# Patient Record
Sex: Male | Born: 1950
Health system: Southern US, Community
[De-identification: ages and names within clinical notes are randomized; demographics above are authoritative.]

## PROBLEM LIST (undated history)

## (undated) DIAGNOSIS — I10 Essential (primary) hypertension: Secondary | ICD-10-CM

## (undated) DIAGNOSIS — I429 Cardiomyopathy, unspecified: Secondary | ICD-10-CM

## (undated) DIAGNOSIS — I509 Heart failure, unspecified: Secondary | ICD-10-CM

## (undated) HISTORY — DX: Heart failure, unspecified: I50.9

## (undated) HISTORY — PX: KNEE SURGERY: SHX244

## (undated) HISTORY — PX: ARM AMPUTATION AT ELBOW: SHX550

---

## 2008-02-17 ENCOUNTER — Ambulatory Visit: Payer: Self-pay | Admitting: Emergency Medicine

## 2013-06-17 ENCOUNTER — Ambulatory Visit: Payer: Self-pay

## 2013-06-17 DIAGNOSIS — H60399 Other infective otitis externa, unspecified ear: Secondary | ICD-10-CM | POA: Diagnosis not present

## 2013-06-17 DIAGNOSIS — L600 Ingrowing nail: Secondary | ICD-10-CM | POA: Diagnosis not present

## 2013-06-17 DIAGNOSIS — H9209 Otalgia, unspecified ear: Secondary | ICD-10-CM | POA: Diagnosis not present

## 2017-06-01 ENCOUNTER — Ambulatory Visit
Admission: EM | Admit: 2017-06-01 | Discharge: 2017-06-01 | Disposition: A | Discharge status: Home / Self Care | Payer: Medicare Other | Attending: Family Medicine | Admitting: Family Medicine

## 2017-06-01 DIAGNOSIS — F17200 Nicotine dependence, unspecified, uncomplicated: Secondary | ICD-10-CM | POA: Insufficient documentation

## 2017-06-01 DIAGNOSIS — Z89201 Acquired absence of right upper limb, unspecified level: Secondary | ICD-10-CM | POA: Insufficient documentation

## 2017-06-01 DIAGNOSIS — J0100 Acute maxillary sinusitis, unspecified: Secondary | ICD-10-CM

## 2017-06-01 DIAGNOSIS — J029 Acute pharyngitis, unspecified: Secondary | ICD-10-CM

## 2017-06-01 DIAGNOSIS — R030 Elevated blood-pressure reading, without diagnosis of hypertension: Secondary | ICD-10-CM

## 2017-06-01 LAB — RAPID STREP SCREEN (MED CTR MEBANE ONLY): STREPTOCOCCUS, GROUP A SCREEN (DIRECT): NEGATIVE

## 2017-06-01 MED ORDER — AMOXICILLIN-POT CLAVULANATE 875-125 MG PO TABS: 1.0000 | ORAL_TABLET | Freq: Two times a day (BID) | ORAL | 0 refills | Status: DC

## 2017-06-01 NOTE — ED Provider Notes (Signed)
[name] ____________________________________________  Time seen: Approximately 11:12 AM  I have reviewed the triage vital signs and the nursing notes.   HISTORY  Chief Complaint Sore Throat   HPI Scott Meyer is a 66 y.o. male  presenting for evaluation of drainage in the back of his throat, some sore throat and itching sensation to his throat. Patient states that these symptoms have been present for approximately 1 week. Patient states that he is feeling a lot of drainage, and in further discussing with patient states drainage feels like it is in the back of his throat and ears going down his throat. Patient reports this sensation often makes him have to clear his throat with some cough. Patient also reports that he is having some pressure to the front of his face around his cheeks, that has been present since time of draiange. Denies any known fevers or chills. States has been taking some over-the-counter Tylenol and ibuprofen which does help with some of these complaints. Denies known sick contacts. Denies any chest pain, shortness of breath or chest discomfort. Denies any vision changes, dizziness, weakness or paresthesias. Has not been taking any other over-the-counter medications for the same complaints.  Patient reports that he has been told in the past that he had high blood pressure, but states that he does not follow with a doctor regularly. States he is usually seen at [facility] when seen by a Doctor and is unable to state if this is through outpatient office or through the emergency room. Patient reports that he lives with his sister.   Denies chest pain, shortness of breath, abdominal pain, dysuria, or rash. Denies recent sickness. Denies recent antibiotic use. Denies cardiac history. Denies known renal insufficiency.   past medical history High blood pressure readings-states never put on medication for this.  There are no active problems to display for this  patient.   Past Surgical History:  Procedure Laterality Date  . ARM AMPUTATION AT ELBOW Right   . KNEE SURGERY       No current facility-administered medications for this encounter.   Current Outpatient Prescriptions:  .  amoxicillin-clavulanate (AUGMENTIN) 875-125 MG tablet, Take 1 tablet by mouth every 12 (twelve) hours., Disp: 20 tablet, Rfl: 0  Allergies Patient has no known allergies.  History reviewed. No pertinent family history.  Social History Social History  Substance Use Topics  . Smoking status: Current Every Day Smoker  . Smokeless tobacco: Never Used  . Alcohol use No    Review of Systems Constitutional: No fever/chills Eyes: No visual changes. ENT: As above.  Cardiovascular: Denies chest pain. Respiratory: Denies shortness of breath. Gastrointestinal: No abdominal pain.  No nausea, no vomiting. Musculoskeletal: Negative for back pain. Skin: Negative for rash. Neurological: Negative for headaches, focal weakness or numbness.   ____________________________________________   PHYSICAL EXAM:  VITAL SIGNS: ED Triage Vitals  Enc Vitals Group     BP 06/01/17 1032 (!) 194/89     Pulse Rate 06/01/17 1032 61     Resp -- 18     Temp 06/01/17 1032 98.1 F (36.7 C)     Temp Source 06/01/17 1032 Oral     SpO2 06/01/17 1032 98 %     Weight 06/01/17 1031 135 lb (61.2 kg)     Height 06/01/17 1031 6' 3.5" (1.918 m)     Head Circumference --      Peak Flow --      Pain Score 06/01/17 1031 8  Pain Loc --      Pain Edu? --      Excl. in GC? --    Vitals:   06/01/17 1031 06/01/17 1032 06/01/17 1116  BP:  (!) 194/89 (!) 178/98  Pulse:  61   Temp:  98.1 F (36.7 C)   TempSrc:  Oral   SpO2:  98%   Weight: 135 lb (61.2 kg)    Height: 6' 3.5" (1.918 m)      Constitutional: Alert and oriented. Well appearing and in no acute distress. Eyes: Conjunctivae are normal.  Head: Atraumatic.Mild  tenderness to palpation bilateral maxillary sinuses, no frontal  sinus tenderness to palpation. No swelling. No erythema.   Ears: no erythema, normal TMs bilaterally.   Nose: nasal congestion with bilateral nasal turbinate erythema and edema.   Mouth/Throat: Mucous membranes are moist.  Oropharynx non-erythematous.No tonsillar swelling or exudate. Posterior pharyngeal drainage noted. Widespread dental decay. Neck: No stridor.  No cervical spine tenderness to palpation. Hematological/Lymphatic/Immunilogical: No cervical lymphadenopathy. Cardiovascular: Normal rate, regular rhythm. Grossly normal heart sounds.  Good peripheral circulation. Respiratory: Normal respiratory effort.  No retractions. No wheezes, rales or rhonchi. Good air movement.  Gastrointestinal: Soft and nontender.  Musculoskeletal: Steady gait. No cervical, thoracic or lumbar tenderness to palpation.  Neurologic:  Normal speech and language. No gross focal neurologic deficits are appreciated. No gait instability. Skin:  Skin is warm, dry and intact. No rash noted. Psychiatric: Mood and affect are normal. Speech and behavior are normal.  ___________________________________________   LABS (all labs ordered are listed, but only abnormal results are displayed)  Labs Reviewed  RAPID STREP SCREEN (NOT AT [facility])  CULTURE, GROUP A STREP [facility])   ____________________________________________  PROCEDURES Procedures  ________________________________________   INITIAL IMPRESSION / ASSESSMENT AND PLAN / ED COURSE  Pertinent labs & imaging results that were available during my care of the patient were reviewed by me and considered in my medical decision making (see chart for details).  Well appearing patient. No acute distress. Suspect maxillary sinusitis with associated postnasal drainage. Will set patient on oral Augmentin. Discussed in detail with patient need to have regular follow-up and close monitoring of his blood pressure which is noted be elevated at this time. Patient denies other  complaints at this time. Reports previous history of this. Encouraged patient to follow-up this week with primary care for further management of blood pressure. Encouraged to have monitoring at home and keep journal. Also discussed monitoring and when he is taking over-the-counter including NSAIDs and congestion medications that can increase blood pressure, and to avoid. States he will follow up. Discussed indication, risks and benefits of medications with patient.  Discussed follow up with Primary care physician this week. Discussed follow up and return parameters including no resolution or any worsening concerns. Patient verbalized understanding and agreed to plan.   ____________________________________________   FINAL CLINICAL IMPRESSION(S) / ED DIAGNOSES  Final diagnoses:  Acute maxillary sinusitis, recurrence not specified  Pharyngitis, unspecified etiology  Elevated blood pressure reading     New Prescriptions   AMOXICILLIN-CLAVULANATE (AUGMENTIN) 875-125 MG TABLET    Take 1 tablet by mouth every 12 (twelve) hours.    Note: This dictation was prepared with Dragon dictation along with smaller phrase technology. Any transcriptional errors that result from this process are unintentional.         Renford Dills, NP 06/01/17 1157

## 2017-06-01 NOTE — Discharge Instructions (Signed)
Take medication as prescribed. Rest. Drink plenty of fluids.   You need to have regular follow up. You need to have your blood pressure rechecked and managed. Please follow up with your primary care physician this week.    Return to Urgent care or Emergency room for new or worsening concerns.

## 2017-06-01 NOTE — ED Triage Notes (Signed)
Patient complains of sore throat. Patient states that he feels like "something is coming out of his brain and making him itch". Patient states that symptoms started on Wednesday.

## 2017-06-02 LAB — CULTURE, GROUP A STREP (THRC)

## 2018-04-08 ENCOUNTER — Ambulatory Visit
Admission: EM | Admit: 2018-04-08 | Discharge: 2018-04-08 | Disposition: A | Discharge status: Home / Self Care | Payer: Medicare Other | Attending: Family Medicine | Admitting: Family Medicine

## 2018-04-08 ENCOUNTER — Encounter: Payer: Self-pay | Admitting: Emergency Medicine

## 2018-04-08 ENCOUNTER — Other Ambulatory Visit: Payer: Self-pay

## 2018-04-08 DIAGNOSIS — R030 Elevated blood-pressure reading, without diagnosis of hypertension: Secondary | ICD-10-CM | POA: Diagnosis not present

## 2018-04-08 DIAGNOSIS — L03114 Cellulitis of left upper limb: Secondary | ICD-10-CM | POA: Diagnosis not present

## 2018-04-08 DIAGNOSIS — M25532 Pain in left wrist: Secondary | ICD-10-CM

## 2018-04-08 DIAGNOSIS — L03119 Cellulitis of unspecified part of limb: Secondary | ICD-10-CM

## 2018-04-08 MED ORDER — PREDNISONE 20 MG PO TABS: ORAL_TABLET | ORAL | 0 refills | Status: DC

## 2018-04-08 MED ORDER — DOXYCYCLINE HYCLATE 100 MG PO TABS: 100.0000 mg | ORAL_TABLET | Freq: Two times a day (BID) | ORAL | 0 refills | Status: DC

## 2018-04-08 NOTE — ED Provider Notes (Signed)
[name]    CSN: [id number] Arrival date & time: 04/08/18  1442     History   Chief Complaint Chief Complaint  Patient presents with  . Wrist Pain    left    HPI Scott Meyer is a 67 y.o. male.   67 yo male with a c/o left wrist pain, swelling and redness. Patient denies any fall or other traumatic injury. Denies any fevers, chills, known insect bites, rash, drainage, chest pains, shortness of breath, headaches.   The history is provided by the patient.  Wrist Pain     History reviewed. No pertinent past medical history.  There are no active problems to display for this patient.   Past Surgical History:  Procedure Laterality Date  . ARM AMPUTATION AT ELBOW Right   . KNEE SURGERY         Home Medications    Prior to Admission medications   Medication Sig Start Date End Date Taking? Authorizing Provider  amoxicillin-clavulanate (AUGMENTIN) 875-125 MG tablet Take 1 tablet by mouth every 12 (twelve) hours. 06/01/17   Renford Dills, NP  doxycycline (VIBRA-TABS) 100 MG tablet Take 1 tablet (100 mg total) by mouth 2 (two) times daily. 04/08/18   Payton Mccallum, MD  predniSONE (DELTASONE) 20 MG tablet 2 tabs po qd x 7 days 04/08/18   Payton Mccallum, MD    Family History Family History  Problem Relation Age of Onset  . Diabetes Mother   . Cirrhosis Father   . Alcohol abuse Father     Social History Social History   Tobacco Use  . Smoking status: Former Smoker    Last attempt to quit: 10/09/2017    Years since quitting: 0.4  . Smokeless tobacco: Never Used  Substance Use Topics  . Alcohol use: No  . Drug use: No     Allergies   Patient has no known allergies.   Review of Systems Review of Systems   Physical Exam Triage Vital Signs ED Triage Vitals  Enc Vitals Group     BP 04/08/18 1518 (!) 179/101     Pulse Rate 04/08/18 1518 76     Resp 04/08/18 1518 16     Temp 04/08/18 1518 98.1 F (36.7 C)     Temp Source 04/08/18 1518  Oral     SpO2 04/08/18 1518 99 %     Weight 04/08/18 1517 240 lb (108.9 kg)     Height 04/08/18 1517 6\' 3"  (1.905 m)     Head Circumference --      Peak Flow --      Pain Score 04/08/18 1517 8     Pain Loc --      Pain Edu? --      Excl. in GC? --    No data found.  Updated Vital Signs BP (!) 179/101 (BP Location: Left Arm)   Pulse 76   Temp 98.1 F (36.7 C) (Oral)   Resp 16   Ht 6\' 3"  (1.905 m)   Wt 240 lb (108.9 kg)   SpO2 99%   BMI 30.00 kg/m   Visual Acuity Right Eye Distance:   Left Eye Distance:   Bilateral Distance:    Right Eye Near:   Left Eye Near:    Bilateral Near:     Physical Exam  Constitutional: He appears well-developed and well-nourished. No distress.  Musculoskeletal:       Left wrist: He exhibits tenderness and swelling. He exhibits normal range of motion, no  bony tenderness, no effusion, no crepitus, no deformity and no laceration.  Left wrist with warmth, tenderness and blanchable erythema  Skin: He is not diaphoretic.  Nursing note and vitals reviewed.    UC Treatments / Results  Labs (all labs ordered are listed, but only abnormal results are displayed) Labs Reviewed - No data to display  EKG None  Radiology No results found.  Procedures Procedures (including critical care time)  Medications Ordered in UC Medications - No data to display  Initial Impression / Assessment and Plan / UC Course  I have reviewed the triage vital signs and the nursing notes.  Pertinent labs & imaging results that were available during my care of the patient were reviewed by me and considered in my medical decision making (see chart for details).      Final Clinical Impressions(s) / UC Diagnoses   Final diagnoses:  Cellulitis of wrist  Left wrist pain  Elevated blood pressure reading     Discharge Instructions     Tylenol as needed for pain, elevation, warm compresses    ED Prescriptions    Medication Sig Dispense Auth. Provider    predniSONE (DELTASONE) 20 MG tablet 2 tabs po qd x 7 days 14 tablet Sayre Witherington, MD   doxycycline (VIBRA-TABS) 100 MG tablet Take 1 tablet (100 mg total) by mouth 2 (two) times daily. 20 tablet Payton Mccallum, MD      1. Labs/x-ray results and diagnosis reviewed with patient/parent/guardian/family 2. rx as per orders above; reviewed possible side effects, interactions, risks and benefits  3. Recommend supportive treatment as above 4. Follow up with PCP this week for recheck blood pressure  5. Follow-up prn if symptoms worsen or don't improve  Controlled Substance Prescriptions [place] Controlled Substance Registry consulted? Not Applicable   Payton Mccallum, MD 04/08/18 [phone]

## 2018-04-08 NOTE — ED Triage Notes (Signed)
Patient in today c/o left wrist pain, which is chronic. Patient states his wrist started swelling on Monday (04/06/18).

## 2018-04-08 NOTE — Discharge Instructions (Addendum)
Tylenol as needed for pain, elevation, warm compresses

## 2018-04-26 ENCOUNTER — Ambulatory Visit
Admission: EM | Admit: 2018-04-26 | Discharge: 2018-04-26 | Disposition: A | Discharge status: Home / Self Care | Payer: Medicare Other | Attending: Family Medicine | Admitting: Family Medicine

## 2018-04-26 ENCOUNTER — Encounter: Payer: Self-pay | Admitting: Gynecology

## 2018-04-26 ENCOUNTER — Ambulatory Visit: Payer: Medicare Other

## 2018-04-26 DIAGNOSIS — R609 Edema, unspecified: Secondary | ICD-10-CM | POA: Insufficient documentation

## 2018-04-26 DIAGNOSIS — M79642 Pain in left hand: Secondary | ICD-10-CM | POA: Diagnosis not present

## 2018-04-26 DIAGNOSIS — Z811 Family history of alcohol abuse and dependence: Secondary | ICD-10-CM | POA: Diagnosis not present

## 2018-04-26 DIAGNOSIS — Z89201 Acquired absence of right upper limb, unspecified level: Secondary | ICD-10-CM | POA: Diagnosis not present

## 2018-04-26 DIAGNOSIS — M7989 Other specified soft tissue disorders: Secondary | ICD-10-CM | POA: Diagnosis not present

## 2018-04-26 DIAGNOSIS — Z8379 Family history of other diseases of the digestive system: Secondary | ICD-10-CM | POA: Diagnosis not present

## 2018-04-26 DIAGNOSIS — Z87891 Personal history of nicotine dependence: Secondary | ICD-10-CM | POA: Diagnosis not present

## 2018-04-26 DIAGNOSIS — M25532 Pain in left wrist: Secondary | ICD-10-CM | POA: Diagnosis not present

## 2018-04-26 DIAGNOSIS — I10 Essential (primary) hypertension: Secondary | ICD-10-CM | POA: Diagnosis not present

## 2018-04-26 DIAGNOSIS — X58XXXA Exposure to other specified factors, initial encounter: Secondary | ICD-10-CM | POA: Insufficient documentation

## 2018-04-26 DIAGNOSIS — S63502A Unspecified sprain of left wrist, initial encounter: Secondary | ICD-10-CM | POA: Diagnosis not present

## 2018-04-26 DIAGNOSIS — Z833 Family history of diabetes mellitus: Secondary | ICD-10-CM | POA: Diagnosis not present

## 2018-04-26 DIAGNOSIS — R600 Localized edema: Secondary | ICD-10-CM

## 2018-04-26 HISTORY — DX: Essential (primary) hypertension: I10

## 2018-04-26 LAB — CBC WITH DIFFERENTIAL/PLATELET
BASOS ABS: 0 10*3/uL (ref 0–0.1)
Basophils Relative: 1 %
EOS PCT: 2 %
Eosinophils Absolute: 0.2 10*3/uL (ref 0–0.7)
HCT: 42.4 % (ref 40.0–52.0)
Hemoglobin: 14 g/dL (ref 13.0–18.0)
LYMPHS PCT: 17 %
Lymphs Abs: 1.4 10*3/uL (ref 1.0–3.6)
MCH: 30.2 pg (ref 26.0–34.0)
MCHC: 33 g/dL (ref 32.0–36.0)
MCV: 91.6 fL (ref 80.0–100.0)
MONO ABS: 0.6 10*3/uL (ref 0.2–1.0)
Monocytes Relative: 7 %
Neutro Abs: 6.1 10*3/uL (ref 1.4–6.5)
Neutrophils Relative %: 73 %
PLATELETS: 183 10*3/uL (ref 150–440)
RBC: 4.63 MIL/uL (ref 4.40–5.90)
RDW: 13 % (ref 11.5–14.5)
WBC: 8.4 10*3/uL (ref 3.8–10.6)

## 2018-04-26 LAB — COMPREHENSIVE METABOLIC PANEL
ALBUMIN: 4.1 g/dL (ref 3.5–5.0)
ALK PHOS: 89 U/L (ref 38–126)
ALT: 25 U/L (ref 0–44)
AST: 27 U/L (ref 15–41)
Anion gap: 11 (ref 5–15)
BILIRUBIN TOTAL: 1.5 mg/dL — AB (ref 0.3–1.2)
BUN: 16 mg/dL (ref 8–23)
CO2: 21 mmol/L — ABNORMAL LOW (ref 22–32)
CREATININE: 0.95 mg/dL (ref 0.61–1.24)
Calcium: 9.2 mg/dL (ref 8.9–10.3)
Chloride: 107 mmol/L (ref 98–111)
GFR calc Af Amer: >60 mL/min (ref >60)
GLUCOSE: 111 mg/dL — AB (ref 70–99)
POTASSIUM: 3.8 mmol/L (ref 3.5–5.1)
Sodium: 139 mmol/L (ref 135–145)
TOTAL PROTEIN: 8.2 g/dL — AB (ref 6.5–8.1)

## 2018-04-26 LAB — URIC ACID: URIC ACID, SERUM: 6 mg/dL (ref 3.7–8.6)

## 2018-04-26 MED ORDER — LISINOPRIL 10 MG PO TABS: 10.0000 mg | ORAL_TABLET | Freq: Every day | ORAL | 0 refills | Status: DC

## 2018-04-26 MED ORDER — PREDNISONE 20 MG PO TABS: ORAL_TABLET | ORAL | 0 refills | Status: DC

## 2018-04-26 NOTE — ED Triage Notes (Signed)
Per patient was seen x 3 weeks ago for his left wrist pain. Per patient left wrist not getting any better. Per patient swelling in his left hand/wrist. Per stated have high blood pressure but does not have a primary care doctor.

## 2018-04-26 NOTE — ED Provider Notes (Signed)
[name]    CSN: [id number] Arrival date & time: 04/26/18  0802     History   Chief Complaint Chief Complaint  Patient presents with  . Wrist Pain    HPI Scott Meyer is a 67 y.o. male.   67 yo male seen 3 weeks ago with a left wrist/hand pain and treated for cellulitis. States symptoms improved, however after mowing the yard with a push mower last week symptoms worsened again with swelling and pain. Denies any fevers, chills, numbness/tingling, insect bites, falls.   Also has had high blood pressure 3 weeks ago and today as well. Denies chest pains or shortness of breath. States does not have a primary care provider.   The history is provided by the patient.  Wrist Pain     Past Medical History:  Diagnosis Date  . Hypertension     There are no active problems to display for this patient.   Past Surgical History:  Procedure Laterality Date  . ARM AMPUTATION AT ELBOW Right   . KNEE SURGERY         Home Medications    Prior to Admission medications   Medication Sig Start Date End Date Taking? Authorizing Provider  amoxicillin-clavulanate (AUGMENTIN) 875-125 MG tablet Take 1 tablet by mouth every 12 (twelve) hours. 06/01/17   Renford Dills, NP  doxycycline (VIBRA-TABS) 100 MG tablet Take 1 tablet (100 mg total) by mouth 2 (two) times daily. 04/08/18   Payton Mccallum, MD  lisinopril (PRINIVIL,ZESTRIL) 10 MG tablet Take 1 tablet (10 mg total) by mouth daily. 04/26/18   Payton Mccallum, MD  predniSONE (DELTASONE) 20 MG tablet 2 tabs po qd x 7 days 04/26/18   Payton Mccallum, MD    Family History Family History  Problem Relation Age of Onset  . Diabetes Mother   . Cirrhosis Father   . Alcohol abuse Father     Social History Social History   Tobacco Use  . Smoking status: Former Smoker    Last attempt to quit: 10/09/2017    Years since quitting: 0.5  . Smokeless tobacco: Never Used  Substance Use Topics  . Alcohol use: No  . Drug use: No      Allergies   Patient has no known allergies.   Review of Systems Review of Systems   Physical Exam Triage Vital Signs ED Triage Vitals  Enc Vitals Group     BP 04/26/18 0816 (!) 186/95     Pulse Rate 04/26/18 0816 63     Resp --      Temp 04/26/18 0816 97.7 F (36.5 C)     Temp Source 04/26/18 0816 Oral     SpO2 04/26/18 0816 100 %     Weight 04/26/18 0815 235 lb (106.6 kg)     Height --      Head Circumference --      Peak Flow --      Pain Score 04/26/18 0815 7     Pain Loc --      Pain Edu? --      Excl. in GC? --    No data found.  Updated Vital Signs BP (!) 186/95   Pulse 63   Temp 97.7 F (36.5 C) (Oral)   Wt 106.6 kg   SpO2 100%   BMI 29.37 kg/m   Visual Acuity Right Eye Distance:   Left Eye Distance:   Bilateral Distance:    Right Eye Near:   Left Eye Near:  Bilateral Near:     Physical Exam  Constitutional: He appears well-developed and well-nourished. No distress.  Cardiovascular: Normal rate.  Pulmonary/Chest: Effort normal. No respiratory distress.  Musculoskeletal:       Left wrist: He exhibits tenderness, bony tenderness and swelling. He exhibits normal range of motion, no effusion, no crepitus, no deformity and no laceration.       Left hand: He exhibits decreased range of motion, tenderness (mild), bony tenderness and swelling. He exhibits normal two-point discrimination, normal capillary refill, no deformity and no laceration. Normal sensation noted. Normal strength noted.  Skin: He is not diaphoretic.  Nursing note and vitals reviewed.    UC Treatments / Results  Labs (all labs ordered are listed, but only abnormal results are displayed) Labs Reviewed  COMPREHENSIVE METABOLIC PANEL - Abnormal; Notable for the following components:      Result Value   CO2 21 (*)    Glucose, Bld 111 (*)    Total Protein 8.2 (*)    Total Bilirubin 1.5 (*)    All other components within normal limits  CBC WITH DIFFERENTIAL/PLATELET  URIC  ACID    EKG None  Radiology Dg Wrist Complete Left  Result Date: 04/26/2018 CLINICAL DATA:  Left wrist and hand pain and swelling. No known injury. Recently diagnosed hand infection. EXAM: LEFT WRIST - COMPLETE 3+ VIEW COMPARISON:  Left hand obtained at the same time. FINDINGS: Radiocarpal spur formation. Cartilage calcification. Diffuse soft tissue swelling without soft tissue gas, bone destruction or periosteal reaction. IMPRESSION: 1. Diffuse soft tissue swelling without acute bony abnormality. 2. Degenerative changes and chondrocalcinosis. Electronically Signed   By: Beckie Salts M.D.   On: 04/26/2018 09:15   Dg Hand Complete Left  Result Date: 04/26/2018 CLINICAL DATA:  Left hand and wrist pain and swelling. No known injury. Recently diagnosed infection of the hand. EXAM: LEFT HAND - COMPLETE 3+ VIEW COMPARISON:  Left wrist radiographs obtained at the same time. FINDINGS: Diffuse soft tissue swelling. No soft tissue gas, bone destruction or periosteal reaction. No fracture or dislocation. IMPRESSION: Diffuse soft tissue swelling without underlying bony abnormality. Electronically Signed   By: Beckie Salts M.D.   On: 04/26/2018 09:16    Procedures Procedures (including critical care time)  Medications Ordered in UC Medications - No data to display  Initial Impression / Assessment and Plan / UC Course  I have reviewed the triage vital signs and the nursing notes.  Pertinent labs & imaging results that were available during my care of the patient were reviewed by me and considered in my medical decision making (see chart for details).      Final Clinical Impressions(s) / UC Diagnoses   Final diagnoses:  Edema of hand  Sprain of left wrist, initial encounter  Essential hypertension    ED Prescriptions    Medication Sig Dispense Auth. Provider   predniSONE (DELTASONE) 20 MG tablet 2 tabs po qd x 7 days 14 tablet Jacalynn Buzzell, MD   lisinopril (PRINIVIL,ZESTRIL) 10 MG tablet  Take 1 tablet (10 mg total) by mouth daily. 30 tablet Payton Mccallum, MD     1. Labs/x-ray results and diagnosis reviewed with patient 2. rx as per orders above; reviewed possible side effects, interactions, risks and benefits  3. Recommend supportive treatment with otc analgesics, dietary modifications  4. Establish care with PCP 5. Follow-up prn if symptoms worsen or don't improve   Controlled Substance Prescriptions [place] Controlled Substance Registry consulted? Not Applicable   Payton Mccallum, MD  04/26/18 1001  

## 2018-06-21 ENCOUNTER — Ambulatory Visit
Admission: EM | Admit: 2018-06-21 | Discharge: 2018-06-21 | Disposition: A | Discharge status: Home / Self Care | Payer: Medicare Other | Attending: Family Medicine | Admitting: Family Medicine

## 2018-06-21 ENCOUNTER — Other Ambulatory Visit: Payer: Self-pay

## 2018-06-21 DIAGNOSIS — M19032 Primary osteoarthritis, left wrist: Secondary | ICD-10-CM

## 2018-06-21 DIAGNOSIS — Z760 Encounter for issue of repeat prescription: Secondary | ICD-10-CM | POA: Diagnosis not present

## 2018-06-21 DIAGNOSIS — I10 Essential (primary) hypertension: Secondary | ICD-10-CM

## 2018-06-21 MED ORDER — MELOXICAM 15 MG PO TABS: 15.0000 mg | ORAL_TABLET | Freq: Every day | ORAL | 0 refills | Status: DC

## 2018-06-21 MED ORDER — LISINOPRIL 10 MG PO TABS: 10.0000 mg | ORAL_TABLET | Freq: Every day | ORAL | 0 refills | Status: DC

## 2018-06-21 NOTE — ED Triage Notes (Signed)
Pt with chronic left wrist pain. States it flares up from time to time and denies recent injury.

## 2018-06-21 NOTE — ED Provider Notes (Signed)
[name]    CSN: [id number] Arrival date & time: 06/21/18  1002     History   Chief Complaint Chief Complaint  Patient presents with  . Wrist Pain    HPI Scott Meyer is a 67 y.o. male.   67 yo male with a c/o recurrent left wrist pain and swelling due to his arthritis. Patient had x-ray 2 months ago showing osteoarthritis. Denies any recent injury. Also states he ran out of blood pressure medicine and still has not established care with a Primary Care Provider.   The history is provided by the patient.  Wrist Pain     Past Medical History:  Diagnosis Date  . Hypertension     There are no active problems to display for this patient.   Past Surgical History:  Procedure Laterality Date  . ARM AMPUTATION AT ELBOW Right   . KNEE SURGERY         Home Medications    Prior to Admission medications   Medication Sig Start Date End Date Taking? Authorizing Provider  amoxicillin-clavulanate (AUGMENTIN) 875-125 MG tablet Take 1 tablet by mouth every 12 (twelve) hours. 06/01/17   Renford Dills, NP  doxycycline (VIBRA-TABS) 100 MG tablet Take 1 tablet (100 mg total) by mouth 2 (two) times daily. 04/08/18   Payton Mccallum, MD  lisinopril (PRINIVIL,ZESTRIL) 10 MG tablet Take 1 tablet (10 mg total) by mouth daily. 06/21/18   Payton Mccallum, MD  meloxicam (MOBIC) 15 MG tablet Take 1 tablet (15 mg total) by mouth daily. 06/21/18   Payton Mccallum, MD  predniSONE (DELTASONE) 20 MG tablet 2 tabs po qd x 7 days 04/26/18   Payton Mccallum, MD    Family History Family History  Problem Relation Age of Onset  . Diabetes Mother   . Cirrhosis Father   . Alcohol abuse Father     Social History Social History   Tobacco Use  . Smoking status: Current Some Day Smoker    Last attempt to quit: 10/09/2017    Years since quitting: 0.6  . Smokeless tobacco: Never Used  Substance Use Topics  . Alcohol use: No    Comment: quit 8 months ago  . Drug use: No      Allergies   Patient has no known allergies.   Review of Systems Review of Systems   Physical Exam Triage Vital Signs ED Triage Vitals  Enc Vitals Group     BP --      Pulse Rate 06/21/18 1013 (!) 55     Resp 06/21/18 1013 18     Temp 06/21/18 1013 (!) 97 F (36.1 C)     Temp Source 06/21/18 1013 Oral     SpO2 06/21/18 1013 99 %     Weight 06/21/18 1010 235 lb 0.2 oz (106.6 kg)     Height 06/21/18 1010 6\' 3"  (1.905 m)     Head Circumference --      Peak Flow --      Pain Score 06/21/18 1010 9     Pain Loc --      Pain Edu? --      Excl. in GC? --    No data found.  Updated Vital Signs Pulse (!) 55   Temp (!) 97 F (36.1 C) (Oral)   Resp 18   Ht 6\' 3"  (1.905 m)   Wt 106.6 kg   SpO2 99%   BMI 29.37 kg/m   Visual Acuity Right Eye Distance:   Left  Eye Distance:   Bilateral Distance:    Right Eye Near:   Left Eye Near:    Bilateral Near:     Physical Exam  Constitutional: He appears well-developed and well-nourished. No distress.  Musculoskeletal:       Left wrist: He exhibits tenderness and swelling.  Skin: He is not diaphoretic.  Nursing note and vitals reviewed.    UC Treatments / Results  Labs (all labs ordered are listed, but only abnormal results are displayed) Labs Reviewed - No data to display  EKG None  Radiology No results found.  Procedures Procedures (including critical care time)  Medications Ordered in UC Medications - No data to display  Initial Impression / Assessment and Plan / UC Course  I have reviewed the triage vital signs and the nursing notes.  Pertinent labs & imaging results that were available during my care of the patient were reviewed by me and considered in my medical decision making (see chart for details).      Final Clinical Impressions(s) / UC Diagnoses   Final diagnoses:  Primary osteoarthritis of left wrist  Essential hypertension     Discharge Instructions     Establish care with  Primary Care provider    ED Prescriptions    Medication Sig Dispense Auth. Provider   lisinopril (PRINIVIL,ZESTRIL) 10 MG tablet Take 1 tablet (10 mg total) by mouth daily. 30 tablet Payton Mccallum, MD   meloxicam (MOBIC) 15 MG tablet Take 1 tablet (15 mg total) by mouth daily. 15 tablet Payton Mccallum, MD      1. diagnosis reviewed with patient 2. rx as per orders above; reviewed possible side effects, interactions, risks and benefits  3. Recommend supportive treatment with otc tylenol prn 4. Establish care with PCP for chronic health management 4. Follow-up prn if symptoms worsen or don't improve   Controlled Substance Prescriptions [place] Controlled Substance Registry consulted? Not Applicable   Payton Mccallum, MD 06/21/18 1128

## 2018-06-21 NOTE — Discharge Instructions (Signed)
Establish care with Primary Care provider

## 2019-04-08 ENCOUNTER — Other Ambulatory Visit: Payer: Self-pay

## 2019-04-08 ENCOUNTER — Ambulatory Visit
Admission: EM | Admit: 2019-04-08 | Discharge: 2019-04-08 | Disposition: A | Discharge status: Home / Self Care | Payer: Medicare Other | Attending: Urgent Care | Admitting: Urgent Care

## 2019-04-08 DIAGNOSIS — B353 Tinea pedis: Secondary | ICD-10-CM | POA: Diagnosis not present

## 2019-04-08 DIAGNOSIS — H60501 Unspecified acute noninfective otitis externa, right ear: Secondary | ICD-10-CM | POA: Diagnosis not present

## 2019-04-08 MED ORDER — CLOTRIMAZOLE 1 % EX CREA: TOPICAL_CREAM | CUTANEOUS | 0 refills | Status: DC

## 2019-04-08 MED ORDER — NEOMYCIN-POLYMYXIN-HC 3.5-10000-1 OT SUSP: 4.0000 [drp] | Freq: Three times a day (TID) | OTIC | 0 refills | Status: DC

## 2019-04-08 NOTE — ED Provider Notes (Signed)
Scott Meyer   Name: Scott Meyer DOB: [birth year] MRN: [id number] CSN: [id number] PCP: System, Provider Not In  Arrival date and time:  04/08/19 1158  Chief Complaint:  Otalgia and Pruritis   NOTE: Prior to seeing the patient today, I have reviewed the triage nursing documentation and vital signs. Clinical staff has updated patient's PMH/PSHx, current medication list, and drug allergies/intolerances to ensure comprehensive history available to assist in medical decision making.   History:   HPI: Scott Meyer is a 68 y.o. male who presents today with complaints of pain in his RIGHT ear for the last week or so. Patient believes that he has a "blackhead" in his ear. He denies otorrhea or fever. His hearing is reported to be normal. He denies any other recent upper respiratory symptoms. Patient reports that he has had this in the past and his issue was treated with antibiotics, which improved his symptoms. Additionally, patient with complaints of his feet itching x 1 year. Patient notes that they itch worse at night. Patient has used some over the counter medications which have "helped some". Patient soaks his feet every night in hot water, which also helps to improve his symptoms.   Past Medical History:  Diagnosis Date  . Hypertension     Past Surgical History:  Procedure Laterality Date  . ARM AMPUTATION AT ELBOW Right   . KNEE SURGERY      Family History  Problem Relation Age of Onset  . Diabetes Mother   . Cirrhosis Father   . Alcohol abuse Father     Social History   Tobacco Use  . Smoking status: Former Smoker    Quit date: 10/09/2017    Years since quitting: 1.4  . Smokeless tobacco: Never Used  Substance Use Topics  . Alcohol use: No    Comment: quit 8 months ago  . Drug use: No    There are no active problems to display for this patient.   Home Medications:    Current Meds  Medication Sig  . [DISCONTINUED] lisinopril (PRINIVIL,ZESTRIL) 10 MG tablet Take  1 tablet (10 mg total) by mouth daily.    Allergies:   Patient has no known allergies.  Review of Systems (ROS): Review of Systems  Constitutional: Negative for chills and fever.  HENT: Positive for ear pain. Negative for congestion, ear discharge, rhinorrhea, sinus pressure, sinus pain and sore throat.   Respiratory: Negative for cough and shortness of breath.   Cardiovascular: Negative for chest pain and palpitations.  Skin: Positive for color change and rash.  Neurological: Negative for dizziness, syncope, weakness and headaches.  Hematological: Negative for adenopathy.  All other systems reviewed and are negative.    Vital Signs: Today's Vitals   04/08/19 1205 04/08/19 1207  BP:  (!) 164/94  Pulse:  68  Resp:  16  Temp:  98.1 F (36.7 C)  TempSrc:  Oral  SpO2:  99%  Weight: 248 lb (112.5 kg)   Height: 6' 3.5" (1.918 m)   PainSc: 9      Physical Exam: Physical Exam  Constitutional: He is oriented to person, place, and time and well-developed, well-nourished, and in no distress.  HENT:  Head: Normocephalic and atraumatic.  Right Ear: Hearing normal. Tympanic membrane is not erythematous.  Left Ear: Hearing, tympanic membrane, external ear and ear canal normal.  Nose: Nose normal.  Mouth/Throat: Uvula is midline and mucous membranes are normal. No posterior oropharyngeal edema or posterior oropharyngeal erythema.  EAC erythematous and swollen;  tender. Pus filled comedone deep in canal; ruptured on exam.   Eyes: Pupils are equal, round, and reactive to light. EOM are normal.  Neck: Normal range of motion. Neck supple.  Cardiovascular: Normal rate, regular rhythm, normal heart sounds and intact distal pulses. Exam reveals no gallop and no friction rub.  No murmur heard. Pulmonary/Chest: Effort normal and breath sounds normal. No respiratory distress. He has no wheezes. He has no rales.  Lymphadenopathy:    He has no cervical adenopathy.  Neurological: He is alert and  oriented to person, place, and time. Gait normal. GCS score is 15.  Skin: Skin is warm and dry. Rash (fungal (BILATERAL feet); areas of peeling and raw skin noted about toes) noted.  Psychiatric: Mood, memory, affect and judgment normal.  Nursing note and vitals reviewed.   Urgent Care Treatments / Results:   LABS: PLEASE NOTE: all labs that were ordered this encounter are listed, however only abnormal results are displayed. Labs Reviewed - No data to display  EKG: -None  RADIOLOGY: No results found.  PROCEDURES: Procedures  MEDICATIONS RECEIVED THIS VISIT: Medications - No data to display  PERTINENT CLINICAL COURSE NOTES/UPDATES:   Initial Impression / Assessment and Plan / Urgent Care Course:  Pertinent labs & imaging results that were available during my care of the patient were personally reviewed by me and considered in my medical decision making (see lab/imaging section of note for values and interpretations).  Scott Meyer is a 68 y.o. male who presents to [facility] today with complaints of Otalgia and Pruritis   Patient is well appearing overall in clinic today. He does not appear to be in any acute distress. Presenting symptoms (see HPI) and exam as documented above. RIGHT ear with tenderness and erythema. Comedone present that was ruptured on exam. TM normal. Will treat with a  5 day course of Cortisporin. Patient advised to keep ears clean and dry. Encouraged to avoid cotton tipped swabs. May use Tylenol and/or Ibuprofen as needed for pain.   Regarding his feet. Appearance and reported symptoms consistent with tinea pedis. Will cover with topical clotrimazole. Patient advised to keep feet clean and dry to help prevent further exacerbations of his fungal condition. If not improving, patient may benefit from seeing podiatry. He will need to discuss this with his PCP.   Discussed follow up with primary care physician in 1 week for re-evaluation. I have reviewed  the follow up and strict return precautions for any new or worsening symptoms. Patient is aware of symptoms that would be deemed urgent/emergent, and would thus require further evaluation either here or in the emergency department. At the time of discharge, he verbalized understanding and consent with the discharge plan as it was reviewed with him. All questions were fielded by provider and/or clinic staff prior to patient discharge.    Final Clinical Impressions / Urgent Care Diagnoses:   Final diagnoses:  Tinea pedis of both feet  Acute otitis externa of right ear, unspecified type    New Prescriptions:  [place] Controlled Substance Registry consulted? Not Applicable  Meds ordered this encounter  Medications  . clotrimazole (LOTRIMIN) 1 % cream    Sig: Apply to affected area 2 times daily    Dispense:  15 g    Refill:  0  . neomycin-polymyxin-hydrocortisone (CORTISPORIN) 3.5-10000-1 OTIC suspension    Sig: Place 4 drops into both ears 3 (three) times daily. X 1 week    Dispense:  10 mL  Refill:  0    Recommended Follow up Care:  Patient encouraged to follow up with the following provider within the specified time frame, or sooner as dictated by the severity of his symptoms. As always, he was instructed that for any urgent/emergent care needs, he should seek care either here or in the emergency department for more immediate evaluation.  Follow-up Information    PCP In 1 week.         NOTE: This note was prepared using Scientist, clinical (histocompatibility and immunogenetics)Dragon dictation software along with smaller Lobbyistphrase technology. Despite my best ability to proofread, there is the potential that transcriptional errors may still occur from this process, and are completely unintentional.     Verlee MonteGray, Marica Trentham E, NP 04/08/19 1248

## 2019-04-08 NOTE — Discharge Instructions (Addendum)
It was very nice seeing you today in clinic. Thank you for entrusting me with your care.   Please utilize the medications that we discussed. Your prescriptions have been called in to your pharmacy. Make sure that you are keeping your feet clean and dry.   Make arrangements to follow up with your regular doctor in 1 week for re-evaluation if not improving. If your symptoms/condition worsens, please seek follow up care either here or in the ER. Please remember, our [place] providers are "right here with you" when you need [place].   Again, it was my pleasure to take care of you today. Thank you for choosing our clinic. I hope that you start to feel better quickly.   Honor Loh, MSN, APRN, FNP-C, CEN Advanced Practice Provider [facility]

## 2019-04-08 NOTE — ED Triage Notes (Signed)
Patient complains of right ear pain that started a while back and hes concerned that he has a blackhead in his ear.  Patient also complains of his feet itching at night for one year.

## 2019-04-10 ENCOUNTER — Ambulatory Visit
Admission: EM | Admit: 2019-04-10 | Discharge: 2019-04-10 | Disposition: A | Discharge status: Home / Self Care | Payer: Medicare Other | Attending: Family Medicine | Admitting: Family Medicine

## 2019-04-10 DIAGNOSIS — H9201 Otalgia, right ear: Secondary | ICD-10-CM

## 2019-04-10 DIAGNOSIS — H60501 Unspecified acute noninfective otitis externa, right ear: Secondary | ICD-10-CM

## 2019-04-10 MED ORDER — OFLOXACIN 0.3 % OT SOLN: 10.0000 [drp] | Freq: Every day | OTIC | 0 refills | Status: AC

## 2019-04-10 MED ORDER — AMOXICILLIN-POT CLAVULANATE 875-125 MG PO TABS: 1.0000 | ORAL_TABLET | Freq: Two times a day (BID) | ORAL | 0 refills | Status: DC

## 2019-04-10 NOTE — ED Provider Notes (Signed)
[name] ____________________________________________  Time seen: Approximately 12:30 PM  I have reviewed the triage vital signs and the nursing notes.   HISTORY  Chief Complaint Otalgia   HPI Scott Meyer is a 68 y.o. male presenting for evaluation of right ear discomfort.  Patient reports he was seen in urgent care for same complaint this past Thursday and was started on eardrops.  States he has been having more ear discomfort.  Also reports some itching to his ear.  Denies ear drainage or hearing changes.  Denies aggravating or alleviating factors.  No fevers.  Denies cough, congestion, sore throat, chest pain or shortness of breath.  Reports otherwise doing well.  Does not have a primary care physician.  States he is working to establish primary care.   Past Medical History:  Diagnosis Date   Hypertension     There are no active problems to display for this patient.   Past Surgical History:  Procedure Laterality Date   ARM AMPUTATION AT ELBOW Right    KNEE SURGERY       No current facility-administered medications for this encounter.   Current Outpatient Medications:    amoxicillin-clavulanate (AUGMENTIN) 875-125 MG tablet, Take 1 tablet by mouth every 12 (twelve) hours., Disp: 20 tablet, Rfl: 0   clotrimazole (LOTRIMIN) 1 % cream, Apply to affected area 2 times daily, Disp: 15 g, Rfl: 0   ofloxacin (FLOXIN) 0.3 % OTIC solution, Place 10 drops into the right ear daily for 7 days., Disp: 5 mL, Rfl: 0  Allergies Patient has no known allergies.  Family History  Problem Relation Age of Onset   Diabetes Mother    Cirrhosis Father    Alcohol abuse Father     Social History Social History   Tobacco Use   Smoking status: Former Smoker    Quit date: 10/09/2017    Years since quitting: 1.5   Smokeless tobacco: Never Used  Substance Use Topics   Alcohol use: No    Comment: quit 8 months ago   Drug use: No    Review of  Systems Constitutional: No fever ENT: No sore throat.  Positive right ear complaints. Cardiovascular: Denies chest pain. Respiratory: Denies shortness of breath. Gastrointestinal: No abdominal pain. Musculoskeletal: Negative for back pain. Skin: Negative for rash. Neurological: Negative for headaches.  ____________________________________________   PHYSICAL EXAM:  VITAL SIGNS: ED Triage Vitals  Enc Vitals Group     BP 04/10/19 1219 (!) 180/94     Pulse Rate 04/10/19 1219 68     Resp 04/10/19 1219 18     Temp 04/10/19 1219 98.1 F (36.7 C)     Temp Source 04/10/19 1219 Oral     SpO2 04/10/19 1219 100 %     Weight 04/10/19 1222 248 lb 0.3 oz (112.5 kg)     Height --      Head Circumference --      Peak Flow --      Pain Score 04/10/19 1222 8     Pain Loc --      Pain Edu? --      Excl. in GC? --    Vitals:   04/10/19 1219 04/10/19 1222 04/10/19 1237  BP: (!) 180/94  (!) 168/99  Pulse: 68    Resp: 18    Temp: 98.1 F (36.7 C)    TempSrc: Oral    SpO2: 100%    Weight:  248 lb 0.3 oz (112.5 kg)      Constitutional: Alert  and oriented. Well appearing and in no acute distress. Eyes: Conjunctivae are normal.  ENT      Head: Normocephalic and atraumatic.       Ears: No mass or tenderness bilaterally.  No surrounding erythema or edema bilaterally.  Left: Nontender, normal canal, no erythema  Right: Minimal tenderness to auricle movement.  Right ear canal erythematous with mild edema and yellowish discharge.  Mild TM erythema.  TM appears intact.      Nose: No congestion Hematological/Lymphatic/Immunilogical: No cervical lymphadenopathy. Cardiovascular: Normal rate, regular rhythm. Grossly normal heart sounds.  Good peripheral circulation. Respiratory: Normal respiratory effort without tachypnea nor retractions. Breath sounds are clear and equal bilaterally. No wheezes, rales, rhonchi. Musculoskeletal: Steady gait Neurologic:  Normal speech and language. Speech is  normal. No gait instability.  Skin:  Skin is warm, dry and intact. No rash noted. Psychiatric: Mood and affect are normal. Speech and behavior are normal. Patient exhibits appropriate insight and judgment   ___________________________________________   LABS (all labs ordered are listed, but only abnormal results are displayed)  Labs Reviewed - No data to display   PROCEDURES Procedures    INITIAL IMPRESSION / ASSESSMENT AND PLAN / ED COURSE  Pertinent labs & imaging results that were available during my care of the patient were reviewed by me and considered in my medical decision making (see chart for details).  Well-appearing patient.  No acute distress.  Right otitis externa with canal erythema and edema.  Patient was on Cortisporin will change to ofloxacin, and counseled patient how to instill drops.  Will also start on oral Augmentin.  Without primary care and elevated blood pressure reading today.  Recheck improved.  Counseled patient to monitor, keep journal and establish primary care, patient states he is currently in the process of establishing. Discussed indication, risks and benefits of medications with patient.   Discussed follow up and return parameters including no resolution or any worsening concerns. Patient verbalized understanding and agreed to plan.   ____________________________________________   FINAL CLINICAL IMPRESSION(S) / ED DIAGNOSES  Final diagnoses:  Otalgia, right  Acute otitis externa of right ear, unspecified type     ED Discharge Orders         Ordered    ofloxacin (FLOXIN) 0.3 % OTIC solution  Daily     04/10/19 1232    amoxicillin-clavulanate (AUGMENTIN) 875-125 MG tablet  Every 12 hours     04/10/19 1232           Note: This dictation was prepared with Dragon dictation along with smaller phrase technology. Any transcriptional errors that result from this process are unintentional.        Marylene Land, NP 04/10/19 1310

## 2019-04-10 NOTE — ED Triage Notes (Signed)
Pt here to follow up on his right ear pain he was seen 2 days ago for and states he was given a rx for ear drops but states the ear has gotten worse and unable to get the drops in due to the swelling.

## 2019-04-10 NOTE — Discharge Instructions (Addendum)
Stop current ear drops.Take new medication as prescribed. Rest. Drink plenty of fluids.   Follow up with your primary care physician this week as needed. Return to Urgent care for new or worsening concerns.

## 2019-04-27 ENCOUNTER — Ambulatory Visit: Payer: Medicare Other | Admitting: Family Medicine

## 2019-05-03 ENCOUNTER — Ambulatory Visit (INDEPENDENT_AMBULATORY_CARE_PROVIDER_SITE_OTHER): Payer: Medicare Other | Admitting: Family Medicine

## 2019-05-03 ENCOUNTER — Encounter: Payer: Self-pay | Admitting: Family Medicine

## 2019-05-03 ENCOUNTER — Other Ambulatory Visit: Payer: Self-pay

## 2019-05-03 VITALS — BP 160/100 | HR 76 | Ht 75.0 in | Wt 244.0 lb

## 2019-05-03 DIAGNOSIS — I10 Essential (primary) hypertension: Secondary | ICD-10-CM

## 2019-05-03 DIAGNOSIS — Z7689 Persons encountering health services in other specified circumstances: Secondary | ICD-10-CM

## 2019-05-03 MED ORDER — LISINOPRIL-HYDROCHLOROTHIAZIDE 10-12.5 MG PO TABS: 1.0000 | ORAL_TABLET | Freq: Every day | ORAL | 1 refills | Status: DC

## 2019-05-03 NOTE — Patient Instructions (Signed)

## 2019-05-03 NOTE — Progress Notes (Signed)
Date:  05/03/2019   Name:  Scott Meyer   DOB:  11-25-1950   MRN:  [id number]   Chief Complaint: Establish Care and Hypertension (been off of lisinopril 10mg  since oct of last year)  Hypertension This is a chronic problem. The current episode started more than 1 year ago. The problem has been gradually worsening since onset. The problem is uncontrolled (nonconpliant). Associated symptoms include shortness of breath. Pertinent negatives include no anxiety, blurred vision, chest pain, headaches, malaise/fatigue, neck pain, orthopnea, palpitations, peripheral edema, PND or sweats. There are no associated agents to hypertension. Past treatments include nothing. Compliance problems include diet and medication cost (just the concept of taking medications).  There is no history of angina, kidney disease, CAD/MI, CVA, heart failure, left ventricular hypertrophy, PVD or retinopathy. There is no history of chronic renal disease, a hypertension causing med or renovascular disease.    Review of Systems  Constitutional: Negative for chills, fever and malaise/fatigue.  HENT: Negative for drooling, ear discharge, ear pain, nosebleeds, postnasal drip, rhinorrhea and sore throat.   Eyes: Negative for blurred vision.  Respiratory: Positive for shortness of breath. Negative for cough, chest tightness, wheezing and stridor.   Cardiovascular: Negative for chest pain, palpitations, orthopnea, leg swelling and PND.  Gastrointestinal: Negative for abdominal pain, blood in stool, constipation, diarrhea and nausea.  Endocrine: Negative for polydipsia.  Genitourinary: Negative for dysuria, frequency, hematuria and urgency.  Musculoskeletal: Negative for back pain, myalgias and neck pain.  Skin: Negative for rash.  Allergic/Immunologic: Negative for environmental allergies.  Neurological: Negative for dizziness and headaches.  Hematological: Does not bruise/bleed easily.  Psychiatric/Behavioral: Negative for  suicidal ideas. The patient is not nervous/anxious.     There are no active problems to display for this patient.   No Known Allergies  Past Surgical History:  Procedure Laterality Date  . ARM AMPUTATION AT ELBOW Right   . KNEE SURGERY      Social History   Tobacco Use  . Smoking status: Former Smoker    Quit date: 10/09/2017    Years since quitting: 1.5  . Smokeless tobacco: Never Used  Substance Use Topics  . Alcohol use: No    Comment: quit 8 months ago  . Drug use: No     Medication list has been reviewed and updated.  No outpatient medications have been marked as taking for the 05/03/19 encounter (Office Visit) with Juline Patch, MD.    [facility] 2/9 Scores 05/03/2019  PHQ - 2 Score 0  PHQ- 9 Score 0    BP Readings from Last 3 Encounters:  05/03/19 (!) 160/100  04/10/19 (!) 168/99  04/08/19 (!) 164/94    Physical Exam Vitals signs and nursing note reviewed.  Constitutional:      Appearance: He is normal weight.  HENT:     Head: Normocephalic.     Right Ear: Tympanic membrane, ear canal and external ear normal. There is no impacted cerumen.     Left Ear: Tympanic membrane, ear canal and external ear normal. There is no impacted cerumen.     Nose: Nose normal. No congestion or rhinorrhea.     Mouth/Throat:     Mouth: Mucous membranes are moist.  Eyes:     General: No scleral icterus.       Right eye: No discharge.        Left eye: No discharge.     Conjunctiva/sclera: Conjunctivae normal.     Pupils: Pupils are equal, round, and reactive  to light.  Neck:     Musculoskeletal: Normal range of motion and neck supple.     Thyroid: No thyromegaly.     Vascular: No JVD.     Trachea: No tracheal deviation.  Cardiovascular:     Rate and Rhythm: Normal rate and regular rhythm.     Pulses: Normal pulses.     Heart sounds: Normal heart sounds. No murmur. No friction rub. No gallop.   Pulmonary:     Effort: No respiratory distress.     Breath sounds: Normal  breath sounds. No wheezing or rales.  Abdominal:     General: Bowel sounds are normal.     Palpations: Abdomen is soft. There is no mass.     Tenderness: There is no abdominal tenderness. There is no guarding or rebound.  Musculoskeletal: Normal range of motion.        General: No tenderness.  Lymphadenopathy:     Cervical: No cervical adenopathy.  Skin:    General: Skin is warm.     Findings: No rash.  Neurological:     Mental Status: He is alert and oriented to person, place, and time.     Cranial Nerves: No cranial nerve deficit.     Deep Tendon Reflexes: Reflexes are normal and symmetric.     Wt Readings from Last 3 Encounters:  05/03/19 244 lb (110.7 kg)  04/10/19 248 lb 0.3 oz (112.5 kg)  04/08/19 248 lb (112.5 kg)    BP (!) 160/100   Pulse 76   Ht 6\' 3"  (1.905 m)   Wt 244 lb (110.7 kg)   BMI 30.50 kg/m   Assessment and Plan: 1. Establishing care with new doctor, encounter for Patient establishes care with new physician.  Patient's chart was reviewed in previous encounters physicals and labs were evaluated.  2. Essential hypertension Patient has a history of essential hypertension on lisinopril although lisinopril first started but patient would not return for further evaluation or measurement of blood pressure.  So he is appearing today with no medications and apparently no understanding of high blood pressure - lisinopril-hydrochlorothiazide (ZESTORETIC) 10-12.5 MG tablet; Take 1 tablet by mouth daily.  Dispense: 90 tablet; Refill: 1

## 2019-06-14 ENCOUNTER — Encounter: Payer: Medicare Other | Admitting: Family Medicine

## 2019-06-16 ENCOUNTER — Other Ambulatory Visit: Payer: Self-pay

## 2019-06-16 ENCOUNTER — Encounter: Payer: Self-pay | Admitting: Family Medicine

## 2019-06-16 ENCOUNTER — Ambulatory Visit (INDEPENDENT_AMBULATORY_CARE_PROVIDER_SITE_OTHER): Payer: Medicare Other | Admitting: Family Medicine

## 2019-06-16 VITALS — BP 138/74 | HR 80 | Ht 75.0 in | Wt 242.0 lb

## 2019-06-16 DIAGNOSIS — I10 Essential (primary) hypertension: Secondary | ICD-10-CM | POA: Diagnosis not present

## 2019-06-16 DIAGNOSIS — Z0000 Encounter for general adult medical examination without abnormal findings: Secondary | ICD-10-CM

## 2019-06-16 DIAGNOSIS — T464X5A Adverse effect of angiotensin-converting-enzyme inhibitors, initial encounter: Secondary | ICD-10-CM

## 2019-06-16 DIAGNOSIS — Z23 Encounter for immunization: Secondary | ICD-10-CM

## 2019-06-16 DIAGNOSIS — S48911S Complete traumatic amputation of right shoulder and upper arm, level unspecified, sequela: Secondary | ICD-10-CM

## 2019-06-16 DIAGNOSIS — R05 Cough: Secondary | ICD-10-CM | POA: Diagnosis not present

## 2019-06-16 MED ORDER — LOSARTAN POTASSIUM-HCTZ 50-12.5 MG PO TABS: 1.0000 | ORAL_TABLET | Freq: Every day | ORAL | 3 refills | Status: DC

## 2019-06-16 MED ORDER — LOSARTAN POTASSIUM-HCTZ 50-12.5 MG PO TABS: 1.0000 | ORAL_TABLET | Freq: Every day | ORAL | 1 refills | Status: DC
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
## 2019-06-16 NOTE — Patient Instructions (Signed)

## 2019-06-17 LAB — RENAL FUNCTION PANEL
Albumin: 4.6 g/dL (ref 3.8–4.8)
BUN/Creatinine Ratio: 19 (ref 10–24)
BUN: 23 mg/dL (ref 8–27)
CO2: 22 mmol/L (ref 20–29)
Calcium: 10 mg/dL (ref 8.6–10.2)
Chloride: 103 mmol/L (ref 96–106)
Creatinine, Ser: 1.23 mg/dL (ref 0.76–1.27)
GFR calc Af Amer: 69 mL/min/{1.73_m2} (ref >59)
GFR calc non Af Amer: 60 mL/min/{1.73_m2} (ref >59)
Glucose: 111 mg/dL — ABNORMAL HIGH (ref 65–99)
Phosphorus: 2.2 mg/dL — ABNORMAL LOW (ref 2.8–4.1)
Potassium: 4.2 mmol/L (ref 3.5–5.2)
Sodium: 139 mmol/L (ref 134–144)

## 2019-06-17 LAB — LIPID PANEL WITH LDL/HDL RATIO
Cholesterol, Total: 205 mg/dL — ABNORMAL HIGH (ref 100–199)
HDL: 36 mg/dL — ABNORMAL LOW (ref >39)
LDL Chol Calc (NIH): 148 mg/dL — ABNORMAL HIGH (ref 0–99)
LDL/HDL Ratio: 4.1 ratio — ABNORMAL HIGH (ref 0.0–3.6)
Triglycerides: 116 mg/dL (ref 0–149)
VLDL Cholesterol Cal: 21 mg/dL (ref 5–40)

## 2019-07-07 ENCOUNTER — Encounter: Payer: Self-pay | Admitting: Family Medicine

## 2019-07-07 ENCOUNTER — Other Ambulatory Visit: Payer: Self-pay

## 2019-07-07 ENCOUNTER — Ambulatory Visit (INDEPENDENT_AMBULATORY_CARE_PROVIDER_SITE_OTHER): Payer: Medicare Other | Admitting: Family Medicine

## 2019-07-07 DIAGNOSIS — T464X5A Adverse effect of angiotensin-converting-enzyme inhibitors, initial encounter: Secondary | ICD-10-CM

## 2019-07-07 DIAGNOSIS — R05 Cough: Secondary | ICD-10-CM | POA: Diagnosis not present

## 2019-07-07 DIAGNOSIS — I10 Essential (primary) hypertension: Secondary | ICD-10-CM | POA: Diagnosis not present

## 2019-07-07 DIAGNOSIS — R058 Other specified cough: Secondary | ICD-10-CM

## 2019-07-07 NOTE — Progress Notes (Signed)
Date:  07/07/2019   Name:  Scott Meyer   DOB:  1951-05-19   MRN:  [id number]   Chief Complaint: Hypertension  Hypertension This is a chronic problem. The current episode started more than 1 year ago. The problem has been gradually improving since onset. The problem is controlled. Pertinent negatives include no anxiety, blurred vision, chest pain, headaches, malaise/fatigue, neck pain, orthopnea, palpitations, peripheral edema, PND, shortness of breath or sweats. There are no associated agents to hypertension. Past treatments include angiotensin blockers. The current treatment provides moderate improvement. There is no history of angina, kidney disease, CAD/MI, CVA, heart failure, left ventricular hypertrophy, PVD or retinopathy. There is no history of chronic renal disease, a hypertension causing med or renovascular disease.    Review of Systems  Constitutional: Negative for chills, fever and malaise/fatigue.  HENT: Negative for drooling, ear discharge, ear pain and sore throat.   Eyes: Negative for blurred vision.  Respiratory: Negative for cough, shortness of breath and wheezing.   Cardiovascular: Negative for chest pain, palpitations, orthopnea, leg swelling and PND.  Gastrointestinal: Negative for abdominal pain, blood in stool, constipation, diarrhea and nausea.  Endocrine: Negative for polydipsia.  Genitourinary: Negative for dysuria, frequency, hematuria and urgency.  Musculoskeletal: Negative for back pain, myalgias and neck pain.  Skin: Negative for rash.  Allergic/Immunologic: Negative for environmental allergies.  Neurological: Negative for dizziness and headaches.  Hematological: Does not bruise/bleed easily.  Psychiatric/Behavioral: Negative for suicidal ideas. The patient is not nervous/anxious.     There are no active problems to display for this patient.   No Known Allergies  Past Surgical History:  Procedure Laterality Date  . ARM AMPUTATION AT ELBOW Right    . KNEE SURGERY      Social History   Tobacco Use  . Smoking status: Former Smoker    Quit date: 10/09/2017    Years since quitting: 1.7  . Smokeless tobacco: Never Used  Substance Use Topics  . Alcohol use: No    Comment: quit 8 months ago  . Drug use: No     Medication list has been reviewed and updated.  Current Meds  Medication Sig  . losartan-hydrochlorothiazide (HYZAAR) 50-12.5 MG tablet Take 1 tablet by mouth daily.    PHQ 2/9 Scores 06/16/2019 05/03/2019  PHQ - 2 Score 0 0  PHQ- 9 Score 0 0    BP Readings from Last 3 Encounters:  07/07/19 110/76  06/16/19 138/74  05/03/19 (!) 160/100    Physical Exam Vitals signs and nursing note reviewed.  HENT:     Head: Normocephalic.     Right Ear: External ear normal. There is impacted cerumen.     Left Ear: External ear normal. There is impacted cerumen.     Nose: Nose normal.  Eyes:     General: No scleral icterus.       Right eye: No discharge.        Left eye: No discharge.     Conjunctiva/sclera: Conjunctivae normal.     Pupils: Pupils are equal, round, and reactive to light.  Neck:     Musculoskeletal: Normal range of motion and neck supple.     Thyroid: No thyromegaly.     Vascular: No JVD.     Trachea: No tracheal deviation.  Cardiovascular:     Rate and Rhythm: Normal rate and regular rhythm.     Heart sounds: Normal heart sounds, S1 normal and S2 normal. Heart sounds not distant. No murmur. No systolic murmur.  No diastolic murmur. No friction rub. No gallop. No S3 or S4 sounds.   Pulmonary:     Effort: No respiratory distress.     Breath sounds: Normal breath sounds. No wheezing or rales.  Abdominal:     General: Bowel sounds are normal.     Palpations: Abdomen is soft. There is no mass.     Tenderness: There is no abdominal tenderness. There is no guarding or rebound.  Musculoskeletal: Normal range of motion.        General: No tenderness.  Lymphadenopathy:     Cervical: No cervical adenopathy.   Skin:    General: Skin is warm.     Findings: No rash.  Neurological:     Mental Status: He is alert and oriented to person, place, and time.     Cranial Nerves: No cranial nerve deficit.     Deep Tendon Reflexes: Reflexes are normal and symmetric.     Wt Readings from Last 3 Encounters:  07/07/19 242 lb (109.8 kg)  06/16/19 242 lb (109.8 kg)  05/03/19 244 lb (110.7 kg)    BP 110/76   Pulse 65   Resp 16   Ht 6\' 3"  (1.905 m)   Wt 242 lb (109.8 kg)   SpO2 99%   BMI 30.25 kg/m   Assessment and Plan:  1. Essential hypertension Chronic.  Controlled.  Stable.  Continue losartan hydrochlorothiazide 50-.  Will recheck in 6 months.  At which time we will do lab work previous lab work was reviewed -12.5 mg  2. Cough due to ACE inhibitor Patient had cough due to the ACE inhibitor we switched over to the receptor blocker and patient has had minimal cough on this regimen.

## 2020-02-21 ENCOUNTER — Telehealth: Payer: Self-pay | Admitting: Family Medicine

## 2020-02-21 NOTE — Telephone Encounter (Signed)
Call could not be completed. Pt due to schedule Medicare Annual Wellness Visit (AWV) either virtually/audio only or in office. Whichever the patients preference is.  No history of AWV; please schedule at anytime with [facility] Health Advisor.  This should be a 40 minute visit

## 2020-04-02 DIAGNOSIS — Z23 Encounter for immunization: Secondary | ICD-10-CM | POA: Diagnosis not present

## 2020-11-27 ENCOUNTER — Emergency Department: Payer: Medicare Other

## 2020-11-27 ENCOUNTER — Inpatient Hospital Stay
Admission: EM | Admit: 2020-11-27 | Discharge: 2020-11-30 | DRG: 291 | Disposition: A | Discharge status: Home Health | Payer: Medicare Other | Attending: Internal Medicine | Admitting: Internal Medicine

## 2020-11-27 ENCOUNTER — Ambulatory Visit
Admission: EM | Admit: 2020-11-27 | Discharge: 2020-11-27 | Disposition: A | Discharge status: Home / Self Care | Payer: Medicare Other | Attending: Physician Assistant | Admitting: Physician Assistant

## 2020-11-27 ENCOUNTER — Other Ambulatory Visit: Payer: Self-pay

## 2020-11-27 DIAGNOSIS — I248 Other forms of acute ischemic heart disease: Secondary | ICD-10-CM | POA: Diagnosis present

## 2020-11-27 DIAGNOSIS — R609 Edema, unspecified: Secondary | ICD-10-CM

## 2020-11-27 DIAGNOSIS — Z87891 Personal history of nicotine dependence: Secondary | ICD-10-CM

## 2020-11-27 DIAGNOSIS — I5031 Acute diastolic (congestive) heart failure: Secondary | ICD-10-CM

## 2020-11-27 DIAGNOSIS — R0789 Other chest pain: Secondary | ICD-10-CM | POA: Diagnosis not present

## 2020-11-27 DIAGNOSIS — R001 Bradycardia, unspecified: Secondary | ICD-10-CM | POA: Diagnosis not present

## 2020-11-27 DIAGNOSIS — I471 Supraventricular tachycardia: Secondary | ICD-10-CM

## 2020-11-27 DIAGNOSIS — R600 Localized edema: Secondary | ICD-10-CM

## 2020-11-27 DIAGNOSIS — I160 Hypertensive urgency: Secondary | ICD-10-CM | POA: Diagnosis present

## 2020-11-27 DIAGNOSIS — I10 Essential (primary) hypertension: Secondary | ICD-10-CM | POA: Diagnosis not present

## 2020-11-27 DIAGNOSIS — I110 Hypertensive heart disease with heart failure: Principal | ICD-10-CM | POA: Diagnosis present

## 2020-11-27 DIAGNOSIS — I493 Ventricular premature depolarization: Secondary | ICD-10-CM

## 2020-11-27 DIAGNOSIS — I4719 Other supraventricular tachycardia: Secondary | ICD-10-CM

## 2020-11-27 DIAGNOSIS — Z79899 Other long term (current) drug therapy: Secondary | ICD-10-CM

## 2020-11-27 DIAGNOSIS — I472 Ventricular tachycardia: Secondary | ICD-10-CM | POA: Diagnosis present

## 2020-11-27 DIAGNOSIS — S48911A Complete traumatic amputation of right shoulder and upper arm, level unspecified, initial encounter: Secondary | ICD-10-CM

## 2020-11-27 DIAGNOSIS — I5021 Acute systolic (congestive) heart failure: Secondary | ICD-10-CM | POA: Diagnosis present

## 2020-11-27 DIAGNOSIS — I429 Cardiomyopathy, unspecified: Secondary | ICD-10-CM | POA: Diagnosis present

## 2020-11-27 DIAGNOSIS — R7989 Other specified abnormal findings of blood chemistry: Secondary | ICD-10-CM

## 2020-11-27 DIAGNOSIS — I4729 Other ventricular tachycardia: Secondary | ICD-10-CM

## 2020-11-27 DIAGNOSIS — E785 Hyperlipidemia, unspecified: Secondary | ICD-10-CM

## 2020-11-27 DIAGNOSIS — I5022 Chronic systolic (congestive) heart failure: Secondary | ICD-10-CM

## 2020-11-27 DIAGNOSIS — R778 Other specified abnormalities of plasma proteins: Secondary | ICD-10-CM

## 2020-11-27 DIAGNOSIS — R0602 Shortness of breath: Secondary | ICD-10-CM

## 2020-11-27 DIAGNOSIS — N179 Acute kidney failure, unspecified: Secondary | ICD-10-CM | POA: Diagnosis not present

## 2020-11-27 DIAGNOSIS — L280 Lichen simplex chronicus: Secondary | ICD-10-CM | POA: Diagnosis present

## 2020-11-27 DIAGNOSIS — I420 Dilated cardiomyopathy: Secondary | ICD-10-CM

## 2020-11-27 DIAGNOSIS — Z89211 Acquired absence of right upper limb below elbow: Secondary | ICD-10-CM

## 2020-11-27 DIAGNOSIS — R079 Chest pain, unspecified: Secondary | ICD-10-CM | POA: Diagnosis not present

## 2020-11-27 DIAGNOSIS — I5042 Chronic combined systolic (congestive) and diastolic (congestive) heart failure: Secondary | ICD-10-CM

## 2020-11-27 DIAGNOSIS — Z20822 Contact with and (suspected) exposure to covid-19: Secondary | ICD-10-CM | POA: Diagnosis present

## 2020-11-27 DIAGNOSIS — I5041 Acute combined systolic (congestive) and diastolic (congestive) heart failure: Secondary | ICD-10-CM | POA: Diagnosis present

## 2020-11-27 HISTORY — DX: Cardiomyopathy, unspecified: I42.9

## 2020-11-27 LAB — CBC WITH DIFFERENTIAL/PLATELET
Abs Immature Granulocytes: 0.02 10*3/uL (ref 0.00–0.07)
Basophils Absolute: 0.1 10*3/uL (ref 0.0–0.1)
Basophils Relative: 1 %
Eosinophils Absolute: 0.2 10*3/uL (ref 0.0–0.5)
Eosinophils Relative: 3 %
HCT: 45.5 % (ref 39.0–52.0)
Hemoglobin: 15 g/dL (ref 13.0–17.0)
Immature Granulocytes: 0 %
Lymphocytes Relative: 21 %
Lymphs Abs: 1.6 10*3/uL (ref 0.7–4.0)
MCH: 30.4 pg (ref 26.0–34.0)
MCHC: 33 g/dL (ref 30.0–36.0)
MCV: 92.3 fL (ref 80.0–100.0)
Monocytes Absolute: 0.6 10*3/uL (ref 0.1–1.0)
Monocytes Relative: 8 %
Neutro Abs: 5.1 10*3/uL (ref 1.7–7.7)
Neutrophils Relative %: 67 %
Platelets: 160 10*3/uL (ref 150–400)
RBC: 4.93 MIL/uL (ref 4.22–5.81)
RDW: 12 % (ref 11.5–15.5)
WBC: 7.6 10*3/uL (ref 4.0–10.5)
nRBC: 0 % (ref 0.0–0.2)

## 2020-11-27 LAB — TROPONIN I (HIGH SENSITIVITY)
Troponin I (High Sensitivity): 36 ng/L — ABNORMAL HIGH (ref <18)
Troponin I (High Sensitivity): 46 ng/L — ABNORMAL HIGH (ref <18)

## 2020-11-27 LAB — COMPREHENSIVE METABOLIC PANEL
ALT: 24 U/L (ref 0–44)
AST: 30 U/L (ref 15–41)
Albumin: 3.8 g/dL (ref 3.5–5.0)
Alkaline Phosphatase: 81 U/L (ref 38–126)
Anion gap: 7 (ref 5–15)
BUN: 19 mg/dL (ref 8–23)
CO2: 23 mmol/L (ref 22–32)
Calcium: 9.6 mg/dL (ref 8.9–10.3)
Chloride: 107 mmol/L (ref 98–111)
Creatinine, Ser: 1.06 mg/dL (ref 0.61–1.24)
GFR, Estimated: >60 mL/min (ref >60)
Glucose, Bld: 93 mg/dL (ref 70–99)
Potassium: 4.4 mmol/L (ref 3.5–5.1)
Sodium: 137 mmol/L (ref 135–145)
Total Bilirubin: 1.2 mg/dL (ref 0.3–1.2)
Total Protein: 7.3 g/dL (ref 6.5–8.1)

## 2020-11-27 LAB — BRAIN NATRIURETIC PEPTIDE: B Natriuretic Peptide: 141.1 pg/mL — ABNORMAL HIGH (ref 0.0–100.0)

## 2020-11-27 LAB — MAGNESIUM: Magnesium: 1.6 mg/dL — ABNORMAL LOW (ref 1.7–2.4)

## 2020-11-27 LAB — TSH: TSH: 1.745 u[IU]/mL (ref 0.350–4.500)

## 2020-11-27 MED ORDER — ACETAMINOPHEN 325 MG PO TABS: 650.0000 mg | ORAL_TABLET | Freq: Four times a day (QID) | ORAL | Status: DC | PRN

## 2020-11-27 MED ORDER — NITROGLYCERIN 0.4 MG SL SUBL
0.4000 mg | SUBLINGUAL_TABLET | SUBLINGUAL | Status: DC | PRN
Start: 2020-11-27 — End: 2020-11-30

## 2020-11-27 MED ORDER — MAGNESIUM HYDROXIDE 400 MG/5ML PO SUSP: 30.0000 mL | Freq: Every day | ORAL | Status: DC | PRN

## 2020-11-27 MED ORDER — ASPIRIN EC 81 MG PO TBEC
81.0000 mg | DELAYED_RELEASE_TABLET | Freq: Every day | ORAL | Status: DC
  Administered 2020-11-28 – 2020-11-30 (×3): 81 mg via ORAL
  Filled 2020-11-27 (×3): qty 1

## 2020-11-27 MED ORDER — FUROSEMIDE 10 MG/ML IJ SOLN
40.0000 mg | Freq: Once | INTRAMUSCULAR | Status: AC
  Administered 2020-11-27: 40 mg via INTRAVENOUS
  Filled 2020-11-27: qty 4

## 2020-11-27 MED ORDER — HYDROCHLOROTHIAZIDE 12.5 MG PO CAPS
12.5000 mg | ORAL_CAPSULE | Freq: Every day | ORAL | Status: DC
  Administered 2020-11-27 – 2020-11-28 (×2): 12.5 mg via ORAL
  Filled 2020-11-27 (×2): qty 1

## 2020-11-27 MED ORDER — MORPHINE SULFATE (PF) 2 MG/ML IV SOLN
2.0000 mg | INTRAVENOUS | Status: DC | PRN
  Administered 2020-11-28 – 2020-11-29 (×3): 2 mg via INTRAVENOUS
  Filled 2020-11-27 (×3): qty 1

## 2020-11-27 MED ORDER — LOSARTAN POTASSIUM-HCTZ 50-12.5 MG PO TABS: 1.0000 | ORAL_TABLET | Freq: Every day | ORAL | Status: DC

## 2020-11-27 MED ORDER — SODIUM CHLORIDE 0.9 % IV SOLN: INTRAVENOUS | Status: DC

## 2020-11-27 MED ORDER — ONDANSETRON HCL 4 MG PO TABS: 4.0000 mg | ORAL_TABLET | Freq: Four times a day (QID) | ORAL | Status: DC | PRN

## 2020-11-27 MED ORDER — LOSARTAN POTASSIUM 50 MG PO TABS
50.0000 mg | ORAL_TABLET | Freq: Every day | ORAL | Status: DC
  Administered 2020-11-27 – 2020-11-28 (×2): 50 mg via ORAL
  Filled 2020-11-27 (×2): qty 1

## 2020-11-27 MED ORDER — ENOXAPARIN SODIUM 60 MG/0.6ML ~~LOC~~ SOLN
0.5000 mg/kg | SUBCUTANEOUS | Status: DC
  Administered 2020-11-27: 55 mg via SUBCUTANEOUS
  Filled 2020-11-27 (×2): qty 0.6

## 2020-11-27 MED ORDER — ACETAMINOPHEN 650 MG RE SUPP: 650.0000 mg | Freq: Four times a day (QID) | RECTAL | Status: DC | PRN

## 2020-11-27 MED ORDER — ASPIRIN 81 MG PO CHEW
324.0000 mg | CHEWABLE_TABLET | Freq: Once | ORAL | Status: DC
  Filled 2020-11-27: qty 4

## 2020-11-27 MED ORDER — ASPIRIN 81 MG PO CHEW
324.0000 mg | CHEWABLE_TABLET | Freq: Once | ORAL | Status: AC
  Administered 2020-11-27: 324 mg via ORAL

## 2020-11-27 MED ORDER — TRAZODONE HCL 50 MG PO TABS: 25.0000 mg | ORAL_TABLET | Freq: Every evening | ORAL | Status: DC | PRN

## 2020-11-27 MED ORDER — FUROSEMIDE 10 MG/ML IJ SOLN
40.0000 mg | Freq: Two times a day (BID) | INTRAMUSCULAR | Status: DC
  Administered 2020-11-28: 40 mg via INTRAVENOUS
  Filled 2020-11-27: qty 4

## 2020-11-27 MED ORDER — ONDANSETRON HCL 4 MG/2ML IJ SOLN
4.0000 mg | Freq: Four times a day (QID) | INTRAMUSCULAR | Status: DC | PRN
  Administered 2020-11-28: 4 mg via INTRAVENOUS
  Filled 2020-11-27: qty 2
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
## 2020-11-27 NOTE — ED Notes (Signed)
Pt given warm blanket, denies any needs at this time. Pt has urinal at bedside.

## 2020-11-27 NOTE — H&P (Addendum)
[place]   PATIENT NAME: Scott Meyer    MR#:  [id number]  DATE OF BIRTH:  1951/01/21  DATE OF ADMISSION:  11/27/2020  PRIMARY CARE PHYSICIAN: Duanne Limerick, MD   Patient is coming from: Home  REQUESTING/REFERRING PHYSICIAN: Willy Eddy, MD CHIEF COMPLAINT:   Chief Complaint  Patient presents with  . Irregular Heart Beat  . Leg Swelling  . Hypertension    HISTORY OF PRESENT ILLNESS:  Scott Meyer is a 70 y.o. African-American male with medical history significant for essential hypertension, who presented to the emergency room with acute onset of right lower extremity swelling for which she was seen in urgent care and was noted to be hypertensive with frequent PVCs on her EKG.  The patient has been having chest pain felt as pressure and associated dyspnea intermittently over the last several weeks.  He admitted to dyspnea on exertion.  He has bilateral lower extremity edema more on the right leg and ankle.  He admitted to occasional dry cough without wheezing.  No associated nausea or vomiting or diaphoresis.  No cough or wheezing or hemoptysis.  No recent travels or surgeries.  No headache or dizziness or blurred vision.  No fever or chills.  He has chronic right lower extremity edema since he had a fracture in 1981 with ankle hyperpigmentation.    ED Course: Upon presentation to the ER, blood pressure was 198/78 with a heart rate of 37 and later 86 and otherwise normal vital signs.  Labs revealed unremarkable CMP.  BNP was 141.1 and high-sensitivity troponin I was 36.  CBC was within normal.  COVID-19 PCR is currently pending. EKG as reviewed by me :Showed sinus rhythm with rate of 71 and PVCs with T wave inversion laterally, LVH with QRS widening and repolarization abnormality.  Imaging: Portable chest x-ray showed mild pulmonary vascular congestion.  Right lower extremity venous Doppler came back negative for DVT.  The patient was given 4 with aspirin and 40 mg of  IV Lasix.  She will be admitted to an observation progressive unit bed for further evaluation and management.   PAST MEDICAL HISTORY:   Past Medical History:  Diagnosis Date  . Hypertension     PAST SURGICAL HISTORY:   Past Surgical History:  Procedure Laterality Date  . ARM AMPUTATION AT ELBOW Right   . KNEE SURGERY      SOCIAL HISTORY:   Social History   Tobacco Use  . Smoking status: Former Smoker    Quit date: 10/09/2017    Years since quitting: 3.1  . Smokeless tobacco: Never Used  Substance Use Topics  . Alcohol use: No    Comment: quit 8 months ago    FAMILY HISTORY:   Family History  Problem Relation Age of Onset  . Diabetes Mother   . Cirrhosis Father   . Alcohol abuse Father     DRUG ALLERGIES:  No Known Allergies  REVIEW OF SYSTEMS:   ROS As per history of present illness. All pertinent systems were reviewed above. Constitutional, HEENT, cardiovascular, respiratory, GI, GU, musculoskeletal, neuro, psychiatric, endocrine, integumentary and hematologic systems were reviewed and are otherwise negative/unremarkable except for positive findings mentioned above in the HPI.   MEDICATIONS AT HOME:   Prior to Admission medications   Medication Sig Start Date End Date Taking? Authorizing Provider  losartan-hydrochlorothiazide (HYZAAR) 50-12.5 MG tablet Take 1 tablet by mouth daily. 06/16/19   Duanne Limerick, MD  lisinopril (PRINIVIL,ZESTRIL) 10 MG tablet  Take 1 tablet (10 mg total) by mouth daily. 06/21/18 04/08/19  Payton Mccallum, MD      VITAL SIGNS:  Blood pressure (!) 158/81, pulse 68, temperature 98.1 F (36.7 C), temperature source Oral, resp. rate 17, height 6\' 3"  (1.905 m), weight 108.9 kg, SpO2 98 %.  PHYSICAL EXAMINATION:  Physical Exam  GENERAL:  70 y.o.-year-old African-American male patient lying in the bed with no acute distress.  EYES: Pupils equal, round, reactive to light and accommodation. No scleral icterus. Extraocular muscles intact.   HEENT: Head atraumatic, normocephalic. Oropharynx and nasopharynx clear.  NECK:  Supple, no jugular venous distention. No thyroid enlargement, no tenderness.  LUNGS: Slight diminished bibasal breath sounds with minimal bibasal rales.  No use of accessory muscles of respiration.  CARDIOVASCULAR: Regular rate and rhythm, S1, S2 normal. No murmurs, rubs, or gallops.  ABDOMEN: Soft, nondistended, nontender. Bowel sounds present. No organomegaly or mass.  EXTREMITIES: Bilateral lower extremity pitting edema, with no cyanosis, or clubbing.  He is status post right arm amputation.  His right leg is more swollen with 2+ to 3+ pitting edema extending to the foot with hyperpigmentation and lichenification around the ankle without tenderness. NEUROLOGIC: Cranial nerves II through XII are intact. Muscle strength 5/5 in all extremities. Sensation intact. Gait not checked.  PSYCHIATRIC: The patient is alert and oriented x 3.  Normal affect and good eye contact. SKIN: No obvious rash, lesion, or ulcer.   LABORATORY PANEL:   CBC Recent Labs  Lab 11/27/20 1818  WBC 7.6  HGB 15.0  HCT 45.5  PLT 160   ------------------------------------------------------------------------------------------------------------------  Chemistries  Recent Labs  Lab 11/27/20 1818  NA 137  K 4.4  CL 107  CO2 23  GLUCOSE 93  BUN 19  CREATININE 1.06  CALCIUM 9.6  AST 30  ALT 24  ALKPHOS 81  BILITOT 1.2   ------------------------------------------------------------------------------------------------------------------  Cardiac Enzymes No results for input(s): TROPONINI in the last 168 hours. ------------------------------------------------------------------------------------------------------------------  RADIOLOGY:  11/29/20 Venous Img Lower Unilateral Right  Result Date: 11/27/2020 CLINICAL DATA:  Initial evaluation for right lower extremity swelling for 2 weeks, evaluate for DVT EXAM: RIGHT LOWER EXTREMITY VENOUS  DOPPLER ULTRASOUND TECHNIQUE: Gray-scale sonography with graded compression, as well as color Doppler and duplex ultrasound were performed to evaluate the lower extremity deep venous systems from the level of the common femoral vein and including the common femoral, femoral, profunda femoral, popliteal and calf veins including the posterior tibial, peroneal and gastrocnemius veins when visible. The superficial great saphenous vein was also interrogated. Spectral Doppler was utilized to evaluate flow at rest and with distal augmentation maneuvers in the common femoral, femoral and popliteal veins. COMPARISON:  None. FINDINGS: Contralateral Common Femoral Vein: Respiratory phasicity is normal and symmetric with the symptomatic side. No evidence of thrombus. Normal compressibility. Common Femoral Vein: No evidence of thrombus. Normal compressibility, respiratory phasicity and response to augmentation. Saphenofemoral Junction: No evidence of thrombus. Normal compressibility and flow on color Doppler imaging. Profunda Femoral Vein: No evidence of thrombus. Normal compressibility and flow on color Doppler imaging. Femoral Vein: No evidence of thrombus. Normal compressibility, respiratory phasicity and response to augmentation. Popliteal Vein: No evidence of thrombus. Normal compressibility, respiratory phasicity and response to augmentation. Calf Veins: Veins of the calf are not well visualized on this examination. Superficial Great Saphenous Vein: No evidence of thrombus. Normal compressibility. Venous Reflux:  None. Other Findings:  None. IMPRESSION: No sonographic evidence of deep venous thrombosis. Electronically Signed   By: 11/29/2020  M.D.   On: 11/27/2020 18:52   DG Chest Portable 1 View  Result Date: 11/27/2020 CLINICAL DATA:  Hypertension EXAM: PORTABLE CHEST 1 VIEW COMPARISON:  None. FINDINGS: Mild pulmonary vascular congestion. No pleural effusion or pneumothorax. Cardiomediastinal contours are  within normal limits. IMPRESSION: Mild pulmonary vascular congestion. Electronically Signed   By: Guadlupe Spanish M.D.   On: 11/27/2020 18:19      IMPRESSION AND PLAN:  Active Problems:   Chest pain  1.  Chest pain, rule out acute coronary syndrome. -The patient will be admitted to an observation progressive cardiac unit bed. -We will follow serial troponin I's and place the patient on aspirin. -He will be placed on as needed sublingual nitroglycerin and morphine sulfate for pain. -Cardiology consult will be obtained. -I notified Dr. Gala Romney about the pain  2.  Acute new onset CHF likely diastolic. -The patient will be diuresed with IV Lasix. -We will follow serial troponin I's and obtain 2D echo and cardiology consult as mentioned above. -The patient has right more than left leg edema.  Venous Doppler was negative for DVT.  3.  Hypertensive urgency likely contributing to #2 and possibly to #1. -The patient will be placed on as needed IV labetalol and we will continue Hyzaar.  DVT prophylaxis: Lovenox. Code Status: full code. Family Communication:  The plan of care was discussed in details with the patient (and family). I answered all questions. The patient agreed to proceed with the above mentioned plan. Further management will depend upon hospital course. Disposition Plan: Back to previous home environment Consults called: Cardiology consult. All the records are reviewed and case discussed with ED provider.  Status is: Observation  The patient remains OBS appropriate and will d/c before 2 midnights.  Dispo: The patient is from: Home              Anticipated d/c is to: Home              Patient currently is not medically stable to d/c.   Difficult to place patient No   TOTAL TIME TAKING CARE OF THIS PATIENT: 55 minutes.    Hannah Beat M.D on 11/27/2020 at 7:46 PM  [facility]   From 7 PM-7 AM, contact night-coverage [email or web address]  CC: Primary care  physician; Duanne Limerick, MD

## 2020-11-27 NOTE — Discharge Instructions (Signed)

## 2020-11-27 NOTE — ED Notes (Signed)
Lab notified to draw repeat troponin.

## 2020-11-27 NOTE — ED Provider Notes (Signed)
[name]    CSN: [id number] Arrival date & time: 11/27/20  1516      History   Chief Complaint Chief Complaint  Patient presents with  . Foot Pain    HPI Scott Meyer is a 70 y.o. male presenting for "burning" right foot pain for about a week.  Patient says that he has significant swelling always in this foot/ankle due to an accident he was in in the 1970s.  He has lymphedema in this area.  He says the swelling is not worse recently, but he has not had this pain before.  Denies any numbness or tingling.  He says the pain is worse at night and he also has a lot of itching of his toes.  In the clinic, patient's pulse rate is low at 37 bpm.  He says that every now and then he will have a chest tightness.  He is experiencing it now but only mildly.  He was taking taking losartan HCTZ for blood pressure, but has stopped that on his own.  His blood pressure is elevated at 198/78.  He denies any radiation of the chest tightness.  Denies any breathing difficulty or palpitations.  Denies any syncope or presyncope.  He says he has no history of any cardiac disease.  HPI  Past Medical History:  Diagnosis Date  . Hypertension     There are no problems to display for this patient.   Past Surgical History:  Procedure Laterality Date  . ARM AMPUTATION AT ELBOW Right   . KNEE SURGERY         Home Medications    Prior to Admission medications   Medication Sig Start Date End Date Taking? Authorizing Provider  losartan-hydrochlorothiazide (HYZAAR) 50-12.5 MG tablet Take 1 tablet by mouth daily. 06/16/19   Duanne Limerick, MD  lisinopril (PRINIVIL,ZESTRIL) 10 MG tablet Take 1 tablet (10 mg total) by mouth daily. 06/21/18 04/08/19  Payton Mccallum, MD    Family History Family History  Problem Relation Age of Onset  . Diabetes Mother   . Cirrhosis Father   . Alcohol abuse Father     Social History Social History   Tobacco Use  . Smoking status: Former Smoker    Quit  date: 10/09/2017    Years since quitting: 3.1  . Smokeless tobacco: Never Used  Vaping Use  . Vaping Use: Never used  Substance Use Topics  . Alcohol use: No    Comment: quit 8 months ago  . Drug use: No     Allergies   Patient has no known allergies.   Review of Systems Review of Systems  Constitutional: Negative for fatigue and fever.  Eyes: Negative for visual disturbance.  Respiratory: Positive for chest tightness. Negative for cough and shortness of breath.   Cardiovascular: Negative for chest pain and palpitations.  Gastrointestinal: Negative for abdominal pain, nausea and vomiting.  Musculoskeletal: Positive for arthralgias and joint swelling.  Neurological: Negative for dizziness, weakness and numbness.     Physical Exam Triage Vital Signs ED Triage Vitals [11/27/20 1628]  Enc Vitals Group     BP (!) 198/78     Pulse Rate (!) 37     Resp 20     Temp      Temp src      SpO2 99 %     Weight      Height      Head Circumference      Peak Flow  Pain Score      Pain Loc      Pain Edu?      Excl. in GC?    No data found.  Updated Vital Signs BP (!) 198/78 (BP Location: Left Arm)   Pulse (!) 37   Resp 20   SpO2 99%    Physical Exam Vitals and nursing note reviewed.  Constitutional:      General: He is not in acute distress.    Appearance: Normal appearance. He is well-developed. He is not ill-appearing or diaphoretic.  HENT:     Head: Normocephalic and atraumatic.     Mouth/Throat:     Mouth: Mucous membranes are moist.     Pharynx: Oropharynx is clear.  Eyes:     General: No scleral icterus.    Conjunctiva/sclera: Conjunctivae normal.  Cardiovascular:     Rate and Rhythm: Regular rhythm. Bradycardia present.     Heart sounds: Normal heart sounds.  Pulmonary:     Effort: Pulmonary effort is normal. No respiratory distress.     Breath sounds: Normal breath sounds.  Musculoskeletal:     Cervical back: Neck supple.     Right lower leg:  Edema (moderate edema right ankle. Mild-moderate TTP throughout. Peeling skin of foot and ankle. Severe fungal nail disease) present.  Skin:    General: Skin is warm and dry.  Neurological:     General: No focal deficit present.     Mental Status: He is alert and oriented to person, place, and time. Mental status is at baseline.     Motor: No weakness.     Gait: Gait normal.  Psychiatric:        Mood and Affect: Mood normal.        Behavior: Behavior normal.        Thought Content: Thought content normal.      UC Treatments / Results  Labs (all labs ordered are listed, but only abnormal results are displayed) Labs Reviewed - No data to display  EKG   Radiology No results found.  Procedures ED EKG  Date/Time: 11/27/2020 4:45 PM Performed by: Shirlee Latch, PA-C Authorized by: Shirlee Latch, PA-C   ECG reviewed by ED Physician in the absence of a cardiologist: yes   Previous ECG:    Previous ECG:  Unavailable Interpretation:    Interpretation: abnormal   Rate:    ECG rate:  73   ECG rate assessment: normal   Rhythm:    Rhythm: sinus rhythm   QRS:    QRS axis:  Normal   QRS intervals:  Normal   QRS conduction: normal   ST segments:    ST segments:  Non-specific T waves:    T waves: non-specific   Comments:     Sinus rhythm, regular ventricular rate, PVCs, ST-T wave changes--t wave abnormal II, III, AVf   (including critical care time)  Medications Ordered in UC Medications  aspirin chewable tablet 324 mg (324 mg Oral Given 11/27/20 1645)    Initial Impression / Assessment and Plan / UC Course  I have reviewed the triage vital signs and the nursing notes.  Pertinent labs & imaging results that were available during my care of the patient were reviewed by me and considered in my medical decision making (see chart for details).   70 year old male presenting for right foot pain and swelling.  Upon initial triage his pulse rate was in the 30s to 40s and  blood pressure elevated at 198/78.  He had mentioned some "chest tightness" when questioned about any chest pain.  He said that it been coming and going for a while.  Admitted to mild chest tightness on initial triage.  EKG performed which shows ventricular rate in the 70s.  Abnormal ST and T waves and PVCs.  Based on patient's initial vital signs that are unstable with bradycardia and hypertension and an abnormal EKG, patient advised the need further work-up in the emergency department at this time to assess his heart.  Concern for possible MI and heart failure.  Patient initially reluctant but agreed to go.  Patient given 324 mg aspirin.  EMS given report and patient leaving in stable condition.  Final Clinical Impressions(s) / UC Diagnoses   Final diagnoses:  Bradycardia  Hypertensive urgency  Chest tightness     Discharge Instructions     You have been advised to follow up immediately in the emergency department for concerning signs.symptoms. If you declined EMS transport, please have a family member take you directly to the ED at this time. Do not delay. Based on concerns about condition, if you do not follow up in th e ED, you may risk poor outcomes including worsening of condition, delayed treatment and potentially life threatening issues. If you have declined to go to the ED at this time, you should call your PCP immediately to set up a follow up appointment.  Go to ED for red flag symptoms, including; fevers you cannot reduce with Tylenol/Motrin, severe headaches, vision changes, numbness/weakness in part of the body, lethargy, confusion, intractable vomiting, severe dehydration, chest pain, breathing difficulty, severe persistent abdominal or pelvic pain, signs of severe infection (increased redness, swelling of an area), feeling faint or passing out, dizziness, etc. You should especially go to the ED for sudden acute worsening of condition if you do not elect to go at this time.      ED Prescriptions    None     PDMP not reviewed this encounter.   Shirlee Latch, PA-C 11/28/20 0745

## 2020-11-27 NOTE — Progress Notes (Signed)
PHARMACIST - PHYSICIAN COMMUNICATION  CONCERNING:  Enoxaparin (Lovenox) for DVT Prophylaxis    RECOMMENDATION: Patient was prescribed enoxaprin 40mg  q24 hours for VTE prophylaxis.   Filed Weights   11/27/20 1751  Weight: 108.9 kg (240 lb)    Body mass index is 30 kg/m.  Estimated Creatinine Clearance: 86.5 mL/min (by C-G formula based on SCr of 1.06 mg/dL).   Based on [facility] policy patient is candidate for enoxaparin 0.5mg /kg TBW SQ every 24 hours based on BMI being >30.  Patient is candidate for enoxaparin 30mg  every 24 hours based on CrCl <25ml/min or Weight <45kg  DESCRIPTION: Pharmacy has adjusted enoxaparin dose per [facility] policy.  Patient is now receiving enoxaparin 55 mg every 24 hours    31m, PharmD Clinical Pharmacist  11/27/2020 8:10 PM

## 2020-11-27 NOTE — ED Triage Notes (Signed)
At pt request, contacted a Dois Davenport at number provided by pt.  Advsed her that pt being taken to ED by EMS d/t VS.

## 2020-11-27 NOTE — ED Triage Notes (Signed)
Patient is being discharged from the Urgent Care and sent to the Emergency Department via EMS . Per Athena Masse PA, patient is in need of higher level of care due to bradycardia, hypertension. Patient is aware and verbalizes understanding of plan of care.  Vitals:   11/27/20 1628  BP: (!) 198/78  Pulse: (!) 37  Resp: 20  SpO2: 99%

## 2020-11-27 NOTE — ED Triage Notes (Signed)
Pt presents for burning and itching over R ankle which is chronically swollen.  Pt bradycardic during intake with prev documented pulses in 60s.  Provider notified and to bedside.  Pt states he has been having episodes of CP for months but has not had it evaluated.

## 2020-11-27 NOTE — ED Provider Notes (Signed)
[facility] Emergency Department Provider Note    Event Date/Time   First MD Initiated Contact with Patient 11/27/20 1756     (approximate)  I have reviewed the triage vital signs and the nursing notes.   HISTORY  Chief Complaint Irregular Heart Beat, Leg Swelling, and Hypertension    HPI Scott Meyer is a 70 y.o. male hypertension as well as smoking history presents to the ER for evaluation of right leg swelling.  [facility] sent here has he was noted to be hypertensive and was having frequent PVCs on his EKG.  He is denying any chest pain or pressure at this time.  Has had intermittent episodes of shortness of breath over the past several weeks and some pressure but none at the current moment.  Denies any nausea or vomiting.  No abdominal pain.  No history of DVT PE.    Past Medical History:  Diagnosis Date  . Hypertension    Family History  Problem Relation Age of Onset  . Diabetes Mother   . Cirrhosis Father   . Alcohol abuse Father    Past Surgical History:  Procedure Laterality Date  . ARM AMPUTATION AT ELBOW Right   . KNEE SURGERY     Patient Active Problem List   Diagnosis Date Noted  . Chest pain 11/27/2020      Prior to Admission medications   Medication Sig Start Date End Date Taking? Authorizing Provider  losartan-hydrochlorothiazide (HYZAAR) 50-12.5 MG tablet Take 1 tablet by mouth daily. 06/16/19   Duanne Limerick, MD  lisinopril (PRINIVIL,ZESTRIL) 10 MG tablet Take 1 tablet (10 mg total) by mouth daily. 06/21/18 04/08/19  Payton Mccallum, MD    Allergies Patient has no known allergies.    Social History Social History   Tobacco Use  . Smoking status: Former Smoker    Quit date: 10/09/2017    Years since quitting: 3.1  . Smokeless tobacco: Never Used  Vaping Use  . Vaping Use: Never used  Substance Use Topics  . Alcohol use: No    Comment: quit 8 months ago  .  Drug use: No    Review of Systems Patient denies headaches, rhinorrhea, blurry vision, numbness, shortness of breath, chest pain, edema, cough, abdominal pain, nausea, vomiting, diarrhea, dysuria, fevers, rashes or hallucinations unless otherwise stated above in HPI. ____________________________________________   PHYSICAL EXAM:  VITAL SIGNS: Vitals:   11/27/20 1900 11/27/20 1915  BP: (!) 158/81   Pulse: (!) 58 68  Resp: (!) 23 17  Temp:    SpO2: 99% 98%    Constitutional: Alert and oriented.  Eyes: Conjunctivae are normal.  Head: Atraumatic. Nose: No congestion/rhinnorhea. Mouth/Throat: Mucous membranes are moist.   Neck: No stridor. Painless ROM.  Cardiovascular: Normal rate, regular rhythm. Grossly normal heart sounds.  Good peripheral circulation. Respiratory: Normal respiratory effort.  No retractions. Lungs CTAB. Gastrointestinal: Soft and nontender. No distention. No abdominal bruits. No CVA tenderness. Genitourinary:  Musculoskeletal: sp RUE A, R>L pitting edema of LE. No lower extremity tenderness nor edema.  No joint effusions. Neurologic:  Normal speech and language. No gross focal neurologic deficits are appreciated. No facial droop Skin:  Skin is warm, dry and intact. No rash noted. Psychiatric: Mood and affect are normal. Speech and behavior are normal.  ____________________________________________   LABS (all labs ordered are listed, but only abnormal results are displayed)  Results for orders placed or performed during the hospital  encounter of 11/27/20 (from the past 24 hour(s))  CBC with Differential     Status: None   Collection Time: 11/27/20  6:18 PM  Result Value Ref Range   WBC 7.6 4.0 - 10.5 K/uL   RBC 4.93 4.22 - 5.81 MIL/uL   Hemoglobin 15.0 13.0 - 17.0 g/dL   HCT 54.4 92.0 - 10.0 %   MCV 92.3 80.0 - 100.0 fL   MCH 30.4 26.0 - 34.0 pg   MCHC 33.0 30.0 - 36.0 g/dL   RDW 71.2 19.7 - 58.8 %   Platelets 160 150 - 400 K/uL   nRBC 0.0 0.0 - 0.2  %   Neutrophils Relative % 67 %   Neutro Abs 5.1 1.7 - 7.7 K/uL   Lymphocytes Relative 21 %   Lymphs Abs 1.6 0.7 - 4.0 K/uL   Monocytes Relative 8 %   Monocytes Absolute 0.6 0.1 - 1.0 K/uL   Eosinophils Relative 3 %   Eosinophils Absolute 0.2 0.0 - 0.5 K/uL   Basophils Relative 1 %   Basophils Absolute 0.1 0.0 - 0.1 K/uL   Immature Granulocytes 0 %   Abs Immature Granulocytes 0.02 0.00 - 0.07 K/uL  Comprehensive metabolic panel     Status: None   Collection Time: 11/27/20  6:18 PM  Result Value Ref Range   Sodium 137 135 - 145 mmol/L   Potassium 4.4 3.5 - 5.1 mmol/L   Chloride 107 98 - 111 mmol/L   CO2 23 22 - 32 mmol/L   Glucose, Bld 93 70 - 99 mg/dL   BUN 19 8 - 23 mg/dL   Creatinine, Ser 3.25 0.61 - 1.24 mg/dL   Calcium 9.6 8.9 - 49.8 mg/dL   Total Protein 7.3 6.5 - 8.1 g/dL   Albumin 3.8 3.5 - 5.0 g/dL   AST 30 15 - 41 U/L   ALT 24 0 - 44 U/L   Alkaline Phosphatase 81 38 - 126 U/L   Total Bilirubin 1.2 0.3 - 1.2 mg/dL   GFR, Estimated >26 >41 mL/min   Anion gap 7 5 - 15  Troponin I (High Sensitivity)     Status: Abnormal   Collection Time: 11/27/20  6:18 PM  Result Value Ref Range   Troponin I (High Sensitivity) 36 (H) <18 ng/L  Brain natriuretic peptide     Status: Abnormal   Collection Time: 11/27/20  6:18 PM  Result Value Ref Range   B Natriuretic Peptide 141.1 (H) 0.0 - 100.0 pg/mL   ____________________________________________  EKG My review and personal interpretation at Time: 17:41   Indication: swelling  Rate: 90  Rhythm: sinus with bigeminy Axis: left Other: nonspecific st and t wave abn in inferolateral leads particularly, bigeminy, abnml ekg ____________________________________________  RADIOLOGY  I personally reviewed all radiographic images ordered to evaluate for the above acute complaints and reviewed radiology reports and findings.  These findings were personally discussed with the patient.  Please see medical record for radiology  report.  ____________________________________________   PROCEDURES  Procedure(s) performed:  Procedures    Critical Care performed: no ____________________________________________   INITIAL IMPRESSION / ASSESSMENT AND PLAN / ED COURSE  Pertinent labs & imaging results that were available during my care of the patient were reviewed by me and considered in my medical decision making (see chart for details).   DDX: Asthma, copd, CHF, pna, ptx, malignancy, Pe, anemia  Scott Meyer is a 70 y.o. who presents to the ED with presentation as described above.  Is currently  pain-free but is has lower extremity swelling.  Noted to have bigeminy as well as nonspecific T wave abnormalities on EKG from clinic.  Repeat EKG here does not show evidence of STEMI.  Is not having active pain  Clinical Course as of 11/27/20 1951  Mon Nov 27, 2020  9798 Troponin mildly elevated as is his BNP.  Will give dose of Lasix.  In reviewing his previous EKGs he is having some nonspecific T wave changes.  Is not complaining of any chest pain right now but states that he has been having pressure in his chest with exertion and some left-sided neck pain.  Might have been demand ischemia in the setting of his hypertension they seem less prominent now on EKG and telemetry given his presentation do believe that the benefit from observation in the hospital for serial troponins and blood pressure management.  I will discuss with hospitalist for admission. [PR]    Clinical Course User Index [PR] Willy Eddy, MD    The patient was evaluated in Emergency Department today for the symptoms described in the history of present illness. He/she was evaluated in the context of the global COVID-19 pandemic, which necessitated consideration that the patient might be at risk for infection with the SARS-CoV-2 virus that causes COVID-19. Institutional protocols and algorithms that pertain to the evaluation of patients at risk for  COVID-19 are in a state of rapid change based on information released by regulatory bodies including the CDC and federal and state organizations. These policies and algorithms were followed during the patient's care in the ED.  As part of my medical decision making, I reviewed the following data within the electronic MEDICAL RECORD NUMBER Nursing notes reviewed and incorporated, Labs reviewed, notes from prior ED visits and [place] Controlled Substance Database   ____________________________________________   FINAL CLINICAL IMPRESSION(S) / ED DIAGNOSES  Final diagnoses:  SOB (shortness of breath)  Hypertension, unspecified type  Peripheral edema      NEW MEDICATIONS STARTED DURING THIS VISIT:  New Prescriptions   No medications on file     Note:  This document was prepared using Dragon voice recognition software and may include unintentional dictation errors.    Willy Eddy, MD 11/27/20 [phone]

## 2020-11-28 ENCOUNTER — Encounter: Payer: Self-pay | Admitting: Family Medicine

## 2020-11-28 ENCOUNTER — Observation Stay (HOSPITAL_BASED_OUTPATIENT_CLINIC_OR_DEPARTMENT_OTHER)
Admit: 2020-11-28 | Discharge: 2020-11-28 | Disposition: A | Discharge status: Home / Self Care | Payer: Medicare Other | Attending: Family Medicine | Admitting: Family Medicine

## 2020-11-28 DIAGNOSIS — R778 Other specified abnormalities of plasma proteins: Secondary | ICD-10-CM | POA: Diagnosis not present

## 2020-11-28 DIAGNOSIS — I10 Essential (primary) hypertension: Secondary | ICD-10-CM | POA: Diagnosis not present

## 2020-11-28 DIAGNOSIS — I5031 Acute diastolic (congestive) heart failure: Secondary | ICD-10-CM

## 2020-11-28 DIAGNOSIS — E785 Hyperlipidemia, unspecified: Secondary | ICD-10-CM

## 2020-11-28 DIAGNOSIS — I5021 Acute systolic (congestive) heart failure: Secondary | ICD-10-CM | POA: Diagnosis not present

## 2020-11-28 DIAGNOSIS — R079 Chest pain, unspecified: Secondary | ICD-10-CM | POA: Diagnosis not present

## 2020-11-28 LAB — BASIC METABOLIC PANEL
Anion gap: 9 (ref 5–15)
BUN: 20 mg/dL (ref 8–23)
CO2: 25 mmol/L (ref 22–32)
Calcium: 9.7 mg/dL (ref 8.9–10.3)
Chloride: 104 mmol/L (ref 98–111)
Creatinine, Ser: 1.07 mg/dL (ref 0.61–1.24)
GFR, Estimated: >60 mL/min (ref >60)
Glucose, Bld: 120 mg/dL — ABNORMAL HIGH (ref 70–99)
Potassium: 3.5 mmol/L (ref 3.5–5.1)
Sodium: 138 mmol/L (ref 135–145)

## 2020-11-28 LAB — CBC
HCT: 45.6 % (ref 39.0–52.0)
Hemoglobin: 15.1 g/dL (ref 13.0–17.0)
MCH: 30.5 pg (ref 26.0–34.0)
MCHC: 33.1 g/dL (ref 30.0–36.0)
MCV: 92.1 fL (ref 80.0–100.0)
Platelets: 165 10*3/uL (ref 150–400)
RBC: 4.95 MIL/uL (ref 4.22–5.81)
RDW: 12 % (ref 11.5–15.5)
WBC: 8.7 10*3/uL (ref 4.0–10.5)
nRBC: 0 % (ref 0.0–0.2)

## 2020-11-28 LAB — SARS CORONAVIRUS 2 (TAT 6-24 HRS): SARS Coronavirus 2: NEGATIVE

## 2020-11-28 LAB — ECHOCARDIOGRAM COMPLETE
AR max vel: 2.08 cm2
AV Area VTI: 2.19 cm2
AV Area mean vel: 1.93 cm2
AV Mean grad: 4 mmHg
AV Peak grad: 6.9 mmHg
Ao pk vel: 1.31 m/s
Area-P 1/2: 5.38 cm2
Calc EF: 9.6 %
Height: 75 in
S' Lateral: 4.9 cm
Single Plane A2C EF: -12.9 %
Single Plane A4C EF: 25 %
Weight: 3840 oz

## 2020-11-28 LAB — HIV ANTIBODY (ROUTINE TESTING W REFLEX): HIV Screen 4th Generation wRfx: NONREACTIVE

## 2020-11-28 MED ORDER — CARVEDILOL 3.125 MG PO TABS
3.1250 mg | ORAL_TABLET | Freq: Two times a day (BID) | ORAL | Status: DC
  Administered 2020-11-28 – 2020-11-30 (×4): 3.125 mg via ORAL
  Filled 2020-11-28 (×4): qty 1

## 2020-11-28 MED ORDER — SODIUM CHLORIDE 0.9% FLUSH
10.0000 mL | Freq: Two times a day (BID) | INTRAVENOUS | Status: DC
  Administered 2020-11-28 – 2020-11-30 (×4): 10 mL via INTRAVENOUS

## 2020-11-28 MED ORDER — ATORVASTATIN CALCIUM 20 MG PO TABS
40.0000 mg | ORAL_TABLET | Freq: Every day | ORAL | Status: DC
  Administered 2020-11-28 – 2020-11-29 (×2): 40 mg via ORAL
  Filled 2020-11-28 (×2): qty 2

## 2020-11-28 MED ORDER — FUROSEMIDE 40 MG PO TABS: 40.0000 mg | ORAL_TABLET | Freq: Every day | ORAL | Status: DC

## 2020-11-28 MED ORDER — ENOXAPARIN SODIUM 40 MG/0.4ML ~~LOC~~ SOLN
40.0000 mg | SUBCUTANEOUS | Status: DC
  Administered 2020-11-28 – 2020-11-29 (×2): 40 mg via SUBCUTANEOUS
  Filled 2020-11-28 (×2): qty 0.4

## 2020-11-28 MED ORDER — MAGNESIUM SULFATE 2 GM/50ML IV SOLN
2.0000 g | Freq: Once | INTRAVENOUS | Status: AC
  Administered 2020-11-28: 2 g via INTRAVENOUS
  Filled 2020-11-28: qty 50

## 2020-11-28 NOTE — Consult Note (Signed)
   Heart Failure Nurse Navigator Note  HFrEF 25 to 30%.  Moderate LVH.  Grade 1 diastolic dysfunction.  Mild to moderate aortic sclerosis.  He presented to the emergency room from urgent care with complaints of right leg swelling, dyspnea on exertion and chest discomfort.  Comorbidities:       Medications:  Enteric-coated aspirin 81 mg daily Furosemide 40 mg IV 2 times a day Losartan 50 mg daily   hydrochlorothiazide 12 and half milligrams daily   Labs:  Sodium 138, potassium 3.5, chloride 104, CO2 25, BUN 20 creatinine, 1.07, magnesium yesterday was 1.6, repeat today is pending, WBC 8.7, hemoglobin 15.1 hematocrit 45.6, platelets 165. Weight is 104.6 kg BMI is 28.8 Blood pressure 118/86.    Assessment:  Scott Meyer is awake and alert sitting up in the chair currently denies any respiratory difficulties.  HEENT-poor dentition, no JVD  Cardiac-heart tones of regular rate and rhythm no murmurs or gallops appreciated.  Chest-breath sounds are clear to posterior auscultation.  Abdomen-soft nontender.  Musculoskeletal-right ankle remains swollen, skin is scaly.  Neurologic-speech is clear, moves all extremities without difficulty.  Psych-is pleasant and appropriate makes good eye contact.    Initial meeting with patient, was given heart failure teaching booklet along with his own magnet.  Briefly discussed the function of his heart and how extra fluid and sodium affect his health from here on out.  Reds vest reading was performed 25%.  Continue to visit.  Tresa Endo RN [facility]

## 2020-11-28 NOTE — ED Notes (Signed)
Pt asleep, resting comfortably. VSS

## 2020-11-28 NOTE — Progress Notes (Addendum)
PROGRESS NOTE    Scott Meyer  VOJ:[id number] DOB: 1950-11-23 DOA: 11/27/2020 PCP: Duanne Limerick, MD   Chief Complain: Lower extremity swelling  Brief Narrative: Patient is a 70 year old male with history of hypertension who presented to the emergency department with complaints of lower extremity swelling.  He was initially seen at an urgent care.  He was found to be hypertensive and also noted to have frequent PVCs in the EKGs.  He also felt like he had chest pain/pressure and was complaining of dyspnea intermittently over the last few weeks.  He denies any significant problems and does not follow-up with doctors often.  He has reported of chronic right lower extremity edema since 1981.  On presentation he was found to be hypertensive.  BNP was elevated.  Troponins were mildly elevated and flat trend.  Echocardiogram done here said severe reduction in the left ventricular ejection fraction.  Cardiology consulted and being worked up for systolic congestive heart failure  Assessment & Plan:   Active Problems:   Chest pain  New onset combined acute systolic/diastolic congestive heart failure: Echo showed ejection fraction of 25 to 30%, severe hypokinesis, grade 1 diastolic dysfunction.  No previous echo on file  presented with dyspnea, bilateral lower extremity swelling, chest pressure/pain. Elevated BNP.  Will start on Lasix 40 mg twice daily for now.Also on losartan.  Cardiology following. He may need ischemic work-up.  Elevated troponin: Flat trend.Most likely secondary to supply demand ischemia due to severe congestive heart failure.  Denies any chest pressure or pain during my evaluation.  EKGs showed ST changes, ventricular bigeminy  Hypertensive urgency: Currently blood pressure stable.  Not sure whether he was taking his medications at home. takes losartan-hydrochlorothiazide at home as per med rec.  Restarted losartan  Lower extremity edema: He reports chronic right lower extremity  edema.  Venous Doppler did not show any DVT.  Hypomagnesemia: Supplemented with magnesium.         DVT prophylaxis:Lovenox Code Status: Full Family Communication: None at bedside.Weird number on file Status is: Observation  The patient remains OBS appropriate and will d/c before 2 midnights.  Dispo: The patient is from: Home              Anticipated d/c is to: Home              Patient currently is not medically stable to d/c.   Difficult to place patient No     Consultants: Cardiology  Procedures:None  Antimicrobials:  Anti-infectives (From admission, onward)   None      Subjective: Patient seen and examined the bedside this morning.  Hemodynamically stable during my evaluation.  Comfortable but denies shortness of breath or chest pain.  Reporting improvement in the lower extremity edema.  Objective: Vitals:   11/28/20 0935 11/28/20 0935 11/28/20 1112 11/28/20 1119  BP:  (!) 127/93 118/86   Pulse:  83 80   Resp:  18 16   Temp:  (!) 97.5 F (36.4 C) 97.6 F (36.4 C)   TempSrc:  Oral Oral   SpO2: 99%  97%   Weight:    104.6 kg  Height:    6\' 3"  (1.905 m)    Intake/Output Summary (Last 24 hours) at 11/28/2020 1432 Last data filed at 11/28/2020 1350 Gross per 24 hour  Intake 240 ml  Output 1700 ml  Net -1460 ml   Filed Weights   11/27/20 1751 11/28/20 1119  Weight: 108.9 kg 104.6 kg    Examination:  General exam: Appears calm and comfortable ,Not in distress, pleasant male HEENT:PERRL,Oral mucosa moist, Ear/Nose normal on gross exam Respiratory system: Bilateral equal air entry, normal vesicular breath sounds, no wheezes or crackles  Cardiovascular system: S1 & S2 heard, RRR. No JVD, murmurs, rubs, gallops or clicks.  Gastrointestinal system: Abdomen is nondistended, soft and nontender. No organomegaly or masses felt. Normal bowel sounds heard. Central nervous system: Alert and oriented. No focal neurological deficits. Extremities: trace bilateral  lower extremity  edema, no clubbing ,no cyanosis Skin:No ulcers  Data Reviewed: I have personally reviewed following labs and imaging studies  CBC: Recent Labs  Lab 11/27/20 1818 11/28/20 0633  WBC 7.6 8.7  NEUTROABS 5.1  --   HGB 15.0 15.1  HCT 45.5 45.6  MCV 92.3 92.1  PLT 160 165   Basic Metabolic Panel: Recent Labs  Lab 11/27/20 1818 11/27/20 2039 11/28/20 0633  NA 137  --  138  K 4.4  --  3.5  CL 107  --  104  CO2 23  --  25  GLUCOSE 93  --  120*  BUN 19  --  20  CREATININE 1.06  --  1.07  CALCIUM 9.6  --  9.7  MG  --  1.6*  --    GFR: Estimated Creatinine Clearance: 84 mL/min (by C-G formula based on SCr of 1.07 mg/dL). Liver Function Tests: Recent Labs  Lab 11/27/20 1818  AST 30  ALT 24  ALKPHOS 81  BILITOT 1.2  PROT 7.3  ALBUMIN 3.8   No results for input(s): LIPASE, AMYLASE in the last 168 hours. No results for input(s): AMMONIA in the last 168 hours. Coagulation Profile: No results for input(s): INR, PROTIME in the last 168 hours. Cardiac Enzymes: No results for input(s): CKTOTAL, CKMB, CKMBINDEX, TROPONINI in the last 168 hours. BNP (last 3 results) No results for input(s): PROBNP in the last 8760 hours. HbA1C: No results for input(s): HGBA1C in the last 72 hours. CBG: No results for input(s): GLUCAP in the last 168 hours. Lipid Profile: No results for input(s): CHOL, HDL, LDLCALC, TRIG, CHOLHDL, LDLDIRECT in the last 72 hours. Thyroid Function Tests: Recent Labs    11/27/20 2039  TSH 1.745   Anemia Panel: No results for input(s): VITAMINB12, FOLATE, FERRITIN, TIBC, IRON, RETICCTPCT in the last 72 hours. Sepsis Labs: No results for input(s): PROCALCITON, LATICACIDVEN in the last 168 hours.  Recent Results (from the past 240 hour(s))  SARS CORONAVIRUS 2 (TAT 6-24 HRS) Nasopharyngeal Nasopharyngeal Swab     Status: None   Collection Time: 11/27/20  8:28 PM   Specimen: Nasopharyngeal Swab  Result Value Ref Range Status   SARS  Coronavirus 2 NEGATIVE NEGATIVE Final    Comment: (NOTE) SARS-CoV-2 target nucleic acids are NOT DETECTED.  The SARS-CoV-2 RNA is generally detectable in upper and lower respiratory specimens during the acute phase of infection. Negative results do not preclude SARS-CoV-2 infection, do not rule out co-infections with other pathogens, and should not be used as the sole basis for treatment or other patient management decisions. Negative results must be combined with clinical observations, patient history, and epidemiological information. The expected result is Negative.  Fact Sheet for Patients: HairSlick.no  Fact Sheet for Healthcare Providers: quierodirigir.com  This test is not yet approved or cleared by the [place] FDA and  has been authorized for detection and/or diagnosis of SARS-CoV-2 by FDA under an Emergency Use Authorization (EUA). This EUA will remain  in effect (meaning this test can  be used) for the duration of the COVID-19 declaration under Se ction 564(b)(1) of the Act, 21 U.S.C. section 360bbb-3(b)(1), unless the authorization is terminated or revoked sooner.  Performed at [facility], [address] [place], [place] [address]          Radiology Studies: US Venous Img Lower Unilateral Right  Result Date: 11/27/2020 CLINICAL DATA:  Initial evaluation for right lower extremity swelling for 2 weeks, evaluate for DVT EXAM: RIGHT LOWER EXTREMITY VENOUS DOPPLER ULTRASOUND TECHNIQUE: Gray-scale sonography with graded compression, as well as color Doppler and duplex ultrasound were performed to evaluate the lower extremity deep venous systems from the level of the common femoral vein and including the common femoral, femoral, profunda femoral, popliteal and calf veins including the posterior tibial, peroneal and gastrocnemius veins when visible. The superficial great saphenous vein was also interrogated.  Spectral Doppler was utilized to evaluate flow at rest and with distal augmentation maneuvers in the common femoral, femoral and popliteal veins. COMPARISON:  None. FINDINGS: Contralateral Common Femoral Vein: Respiratory phasicity is normal and symmetric with the symptomatic side. No evidence of thrombus. Normal compressibility. Common Femoral Vein: No evidence of thrombus. Normal compressibility, respiratory phasicity and response to augmentation. Saphenofemoral Junction: No evidence of thrombus. Normal compressibility and flow on color Doppler imaging. Profunda Femoral Vein: No evidence of thrombus. Normal compressibility and flow on color Doppler imaging. Femoral Vein: No evidence of thrombus. Normal compressibility, respiratory phasicity and response to augmentation. Popliteal Vein: No evidence of thrombus. Normal compressibility, respiratory phasicity and response to augmentation. Calf Veins: Veins of the calf are not well visualized on this examination. Superficial Great Saphenous Vein: No evidence of thrombus. Normal compressibility. Venous Reflux:  None. Other Findings:  None. IMPRESSION: No sonographic evidence of deep venous thrombosis. Electronically Signed   By: Rise MuBenjamin  McClintock M.D.   On: 11/27/2020 18:52   DG Chest Portable 1 View  Result Date: 11/27/2020 CLINICAL DATA:  Hypertension EXAM: PORTABLE CHEST 1 VIEW COMPARISON:  None. FINDINGS: Mild pulmonary vascular congestion. No pleural effusion or pneumothorax. Cardiomediastinal contours are within normal limits. IMPRESSION: Mild pulmonary vascular congestion. Electronically Signed   By: Guadlupe SpanishPraneil  Patel M.D.   On: 11/27/2020 18:19   ECHOCARDIOGRAM COMPLETE  Result Date: 11/28/2020    ECHOCARDIOGRAM REPORT   Patient Name:   Scott GroutRONNIE Meyer Date of Exam: 11/28/2020 Medical Rec #:  [id number]     Height:       75.0 in Accession #:    [id number]    Weight:       240.0 lb Date of Birth:  05/14/1951      BSA:          2.371 m Patient Age:    69 years       BP:           127/93 mmHg Patient Gender: M             HR:           83 bpm. Exam Location:  [facility] Procedure: 2D Echo, Cardiac Doppler, Color Doppler and Strain Analysis Indications:     CHF- acute diastolic I50.31  History:         Patient has no prior history of Echocardiogram examinations.                  Risk Factors:Hypertension.  Sonographer:     Cristela BlueJerry Hege RDCS (AE) Referring Phys:  [id number] Vernetta HoneyJAN A MANSY Diagnosing Phys: Yvonne Kendallhristopher End MD  Sonographer Comments:  Global longitudinal strain was attempted. IMPRESSIONS  1. Left ventricular ejection fraction, by estimation, is 25 to 30%. The left ventricle has severely decreased function. The left ventricle demonstrates global hypokinesis. There is moderate left ventricular hypertrophy. Left ventricular diastolic parameters are consistent with Grade I diastolic dysfunction (impaired relaxation). The average left ventricular global longitudinal strain is -5.1 %. The global longitudinal strain is abnormal.  2. Right ventricular systolic function was not well visualized. The right ventricular size is not well visualized. Tricuspid regurgitation signal is inadequate for assessing PA pressure.  3. The mitral valve is grossly normal. No evidence of mitral valve regurgitation. No evidence of mitral stenosis.  4. The aortic valve has an indeterminant number of cusps. There is mild calcification of the aortic valve. There is mild thickening of the aortic valve. Aortic valve regurgitation is not visualized. Mild to moderate aortic valve sclerosis/calcification is present, without any evidence of aortic stenosis. FINDINGS  Left Ventricle: Left ventricular ejection fraction, by estimation, is 25 to 30%. The left ventricle has severely decreased function. The left ventricle demonstrates global hypokinesis. The average left ventricular global longitudinal strain is -5.1 %. The global longitudinal strain is abnormal. The left ventricular internal cavity size was normal in  size. There is moderate left ventricular hypertrophy. Left ventricular diastolic parameters are consistent with Grade I diastolic dysfunction (impaired relaxation). Right Ventricle: The right ventricular size is not well visualized. Right vetricular wall thickness was not well visualized. Right ventricular systolic function was not well visualized. Tricuspid regurgitation signal is inadequate for assessing PA pressure. Left Atrium: Left atrial size was normal in size. Right Atrium: Right atrial size was normal in size. Pericardium: The pericardium was not well visualized. Mitral Valve: The mitral valve is grossly normal. No evidence of mitral valve regurgitation. No evidence of mitral valve stenosis. Tricuspid Valve: The tricuspid valve is not well visualized. Tricuspid valve regurgitation is trivial. Aortic Valve: The aortic valve has an indeterminant number of cusps. There is mild calcification of the aortic valve. There is mild thickening of the aortic valve. Aortic valve regurgitation is not visualized. Mild to moderate aortic valve sclerosis/calcification is present, without any evidence of aortic stenosis. Aortic valve mean gradient measures 4.0 mmHg. Aortic valve peak gradient measures 6.9 mmHg. Aortic valve area, by VTI measures 2.19 cm. Pulmonic Valve: The pulmonic valve was not well visualized. Pulmonic valve regurgitation is not visualized. No evidence of pulmonic stenosis. Aorta: The aortic root is normal in size and structure. Pulmonary Artery: The pulmonary artery is not well seen. Venous: The inferior vena cava was not well visualized. IAS/Shunts: The interatrial septum was not well visualized.  LEFT VENTRICLE PLAX 2D LVIDd:         5.66 cm      Diastology LVIDs:         4.90 cm      LV e' medial:    5.00 cm/s LV PW:         1.45 cm      LV E/e' medial:  10.9 LV IVS:        1.46 cm      LV e' lateral:   3.48 cm/s LVOT diam:     2.20 cm      LV E/e' lateral: 15.6 LV SV:         49 LV SV Index:   21            2D Longitudinal Strain LVOT Area:     3.80 cm  2D Strain GLS Avg:     -5.1 %  LV Volumes (MOD) LV vol d, MOD A2C: 87.9 ml LV vol d, MOD A4C: 164.0 ml LV vol s, MOD A2C: 99.2 ml LV vol s, MOD A4C: 123.0 ml LV SV MOD A2C:     -11.3 ml LV SV MOD A4C:     164.0 ml LV SV MOD BP:      11.8 ml LEFT ATRIUM             Index       RIGHT ATRIUM           Index LA diam:        3.40 cm 1.43 cm/m  RA Area:     17.20 cm LA Vol (A2C):   65.4 ml 27.58 ml/m RA Volume:   46.70 ml  19.69 ml/m LA Vol (A4C):   12.8 ml 5.40 ml/m LA Biplane Vol: 30.1 ml 12.69 ml/m  AORTIC VALVE                   PULMONIC VALVE AV Area (Vmax):    2.08 cm    PV Vmax:        0.65 m/s AV Area (Vmean):   1.93 cm    PV Peak grad:   1.7 mmHg AV Area (VTI):     2.19 cm    RVOT Peak grad: 4 mmHg AV Vmax:           131.00 cm/s AV Vmean:          94.400 cm/s AV VTI:            0.224 m AV Peak Grad:      6.9 mmHg AV Mean Grad:      4.0 mmHg LVOT Vmax:         71.80 cm/s LVOT Vmean:        48.000 cm/s LVOT VTI:          0.129 m LVOT/AV VTI ratio: 0.58  AORTA Ao Root diam: 3.70 cm MITRAL VALVE MV Area (PHT): 5.38 cm    SHUNTS MV Decel Time: 141 msec    Systemic VTI:  0.13 m MV E velocity: 54.40 cm/s  Systemic Diam: 2.20 cm MV A velocity: 90.80 cm/s MV E/A ratio:  0.60 Christopher End MD Electronically signed by Yvonne Kendall MD Signature Date/Time: 11/28/2020/12:25:58 PM    Final         Scheduled Meds: . aspirin EC  81 mg Oral Daily  . enoxaparin (LOVENOX) injection  40 mg Subcutaneous Q24H  . furosemide  40 mg Intravenous BID  . losartan  50 mg Oral Daily   And  . hydrochlorothiazide  12.5 mg Oral Daily   Continuous Infusions:   LOS: 0 days    Time spent: More than 50% of that time was spent in counseling and/or coordination of care.      Burnadette Pop, MD [facility] P3/15/2022, 2:32 PM
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
## 2020-11-28 NOTE — ED Notes (Signed)
Pt asleep in bed at this time. VSS. NAD

## 2020-11-28 NOTE — ED Notes (Signed)
Pt resting in bed, call bell and urinal at bedside. Pt denies needs at this time

## 2020-11-28 NOTE — Progress Notes (Signed)
*  PRELIMINARY RESULTS* Echocardiogram 2D Echocardiogram has been performed.  Cristela Blue 11/28/2020, 10:37 AM

## 2020-11-28 NOTE — Care Management Obs Status (Signed)
MEDICARE OBSERVATION STATUS NOTIFICATION   Patient Details  Name: Scott Meyer MRN: [id number] Date of Birth: Apr 05, 1951   Medicare Observation Status Notification Given:  Yes (Patient worried that him signing form will affect his bill. CSW signing for him and provided a copy.)    Margarito Liner, LCSW 11/28/2020, 4:42 PM

## 2020-11-28 NOTE — ED Notes (Signed)
Unsuccessful attempt to draw 5am labs, phlebotomy called at this time for assitance

## 2020-11-29 ENCOUNTER — Encounter: Payer: Self-pay | Admitting: Family Medicine

## 2020-11-29 DIAGNOSIS — I471 Supraventricular tachycardia: Secondary | ICD-10-CM | POA: Diagnosis not present

## 2020-11-29 DIAGNOSIS — R609 Edema, unspecified: Secondary | ICD-10-CM

## 2020-11-29 DIAGNOSIS — I5041 Acute combined systolic (congestive) and diastolic (congestive) heart failure: Secondary | ICD-10-CM

## 2020-11-29 DIAGNOSIS — I493 Ventricular premature depolarization: Secondary | ICD-10-CM

## 2020-11-29 DIAGNOSIS — S48911S Complete traumatic amputation of right shoulder and upper arm, level unspecified, sequela: Secondary | ICD-10-CM

## 2020-11-29 DIAGNOSIS — E785 Hyperlipidemia, unspecified: Secondary | ICD-10-CM

## 2020-11-29 DIAGNOSIS — I4729 Other ventricular tachycardia: Secondary | ICD-10-CM

## 2020-11-29 DIAGNOSIS — R079 Chest pain, unspecified: Secondary | ICD-10-CM | POA: Diagnosis not present

## 2020-11-29 DIAGNOSIS — I5021 Acute systolic (congestive) heart failure: Secondary | ICD-10-CM | POA: Diagnosis not present

## 2020-11-29 DIAGNOSIS — I5042 Chronic combined systolic (congestive) and diastolic (congestive) heart failure: Secondary | ICD-10-CM | POA: Insufficient documentation

## 2020-11-29 DIAGNOSIS — I10 Essential (primary) hypertension: Secondary | ICD-10-CM

## 2020-11-29 DIAGNOSIS — R7989 Other specified abnormal findings of blood chemistry: Secondary | ICD-10-CM

## 2020-11-29 DIAGNOSIS — I420 Dilated cardiomyopathy: Secondary | ICD-10-CM

## 2020-11-29 DIAGNOSIS — I5022 Chronic systolic (congestive) heart failure: Secondary | ICD-10-CM

## 2020-11-29 DIAGNOSIS — S48911A Complete traumatic amputation of right shoulder and upper arm, level unspecified, initial encounter: Secondary | ICD-10-CM

## 2020-11-29 DIAGNOSIS — I472 Ventricular tachycardia: Secondary | ICD-10-CM

## 2020-11-29 DIAGNOSIS — R778 Other specified abnormalities of plasma proteins: Secondary | ICD-10-CM

## 2020-11-29 LAB — BASIC METABOLIC PANEL
Anion gap: 10 (ref 5–15)
BUN: 34 mg/dL — ABNORMAL HIGH (ref 8–23)
CO2: 25 mmol/L (ref 22–32)
Calcium: 9.8 mg/dL (ref 8.9–10.3)
Chloride: 104 mmol/L (ref 98–111)
Creatinine, Ser: 1.58 mg/dL — ABNORMAL HIGH (ref 0.61–1.24)
GFR, Estimated: 47 mL/min — ABNORMAL LOW (ref >60)
Glucose, Bld: 108 mg/dL — ABNORMAL HIGH (ref 70–99)
Potassium: 4.2 mmol/L (ref 3.5–5.1)
Sodium: 139 mmol/L (ref 135–145)

## 2020-11-29 LAB — LIPID PANEL
Cholesterol: 198 mg/dL (ref 0–200)
HDL: 32 mg/dL — ABNORMAL LOW (ref >40)
LDL Cholesterol: 147 mg/dL — ABNORMAL HIGH (ref 0–99)
Total CHOL/HDL Ratio: 6.2 RATIO
Triglycerides: 93 mg/dL (ref <150)
VLDL: 19 mg/dL (ref 0–40)

## 2020-11-29 LAB — GLUCOSE, CAPILLARY: Glucose-Capillary: 104 mg/dL — ABNORMAL HIGH (ref 70–99)

## 2020-11-29 LAB — HEMOGLOBIN A1C
Hgb A1c MFr Bld: 4.9 % (ref 4.8–5.6)
Mean Plasma Glucose: 93.93 mg/dL

## 2020-11-29 LAB — MAGNESIUM: Magnesium: 2.1 mg/dL (ref 1.7–2.4)

## 2020-11-29 MED ORDER — LOSARTAN POTASSIUM 25 MG PO TABS
25.0000 mg | ORAL_TABLET | Freq: Every day | ORAL | Status: DC
  Administered 2020-11-30: 25 mg via ORAL
  Filled 2020-11-29: qty 1

## 2020-11-29 NOTE — Progress Notes (Addendum)
   Heart Failure Nurse Navigator Note  HFrEf 5 to 30%.  Moderate LVH.  Grade 1 diastolic dysfunction.  Mild to moderate aortic sclerosis.  He presented to the emergency room from urgent care with complaints of right leg swelling, dyspnea on exertion and chest discomfort.   Comorbidities:  Hypertension   Medications:  Aspirin 81 mg daily Atorvastatin 40 mg daily Carvedilol 3.125 mg 2 times a day  He is to start on losartan 25 mg daily.   Labs:  Sodium 139, potassium 4.2, chloride 104, CO2 25, BUN 34, creatinine 1.58 up from 1.07 of yesterday, magnesium 2.1.  Total cholesterol 198, triglycerides 93, HDL 32, LDL 147, Weight 104.3 kg yesterday's weight was 104.6. Blood pressure 98/80 Intake 480 mL Output 500 mL   Assessment:  General-he is awake and alert sitting up on the bedside in no acute distress.  HEENT-pupils are equal, poor dentition.  No JVD noted.  Cardiac-heart tones of regular rate and rhythm no murmur rubs or gallops appreciated.  Chest-breath sounds are clear to posterior auscultation.  Abdomen soft nontender  Musculoskeletal-ankle area remains swollen but less than yesterday.  Psych-is pleasant and appropriate, makes good eye contact.  Neurologic-speech is clear.    Met with patient again today to reinforce heart failure teaching.  Stressed the importance of low-sodium diet discussed foods that were high in sodium and foods that would be appropriate.  Was off on tangents of concerns over his Medicare being deemed on observation patient.  Suggested we will have the social worker come in and talk with him again.  Redirected to back to heart failure teaching, discussed fluid intake, the importance of daily weights and what to report.  Stressed the importance of continuing with his meds continuing to take them.  Went through heart failure teaching booklet along with his own magnet.  Scott Meyer

## 2020-11-29 NOTE — Discharge Summary (Signed)
Physician Discharge Summary  Scott Meyer ZOX:[id number] DOB: 02-22-1951 DOA: 11/27/2020  PCP: Duanne Limerick, MD  Admit date: 11/27/2020 Discharge date: 11/30/2020  Admitted From: hom Disposition:  hom   Recommendations for Outpatient Follow-up:  1. Follow daily weights 2. Follow up BUN/Cr in 1 wk please  Home Health:  RN for teaching  Discharge Condition:  stable   CODE STATUS:  Full code   Diet recommendation:  Heart healthy Consultations:  cardiology  Procedures/Studies: . 2 D ECHO   Discharge Diagnoses:  Principal Problem:   Chest pain Active Problems:   Benign essential HTN   Troponin level elevated   HLD (hyperlipidemia)   Hypomagnesemia   Amputation of right arm (HCC)   Acute combined systolic and diastolic congestive heart failure (HCC)   NSVT (nonsustained ventricular tachycardia) (HCC)     Brief Summary: Patient is a 70 year old male with history of hypertension who presented to the emergency department with complaints of lower extremity swelling.  He was initially seen at an urgent care.  He was found to be hypertensive and also noted to have frequent PVCs in the EKGs.  He also felt like he had chest pain/pressure and was complaining of dyspnea intermittently over the last few weeks.  He denies any significant problems and does not follow-up with doctors often.  He has reported of chronic right lower extremity edema since 1981.  On presentation he was found to be hypertensive.  BNP was elevated.  Troponins were mildly elevated and flat trend.  Hospital Course:  Principal Problem:   Chest pain -Troponin max was 13 -cardiology is recommending a L and R heart cardiac cath but the patient has declined this- he is waiting for something to come from above and this will not happen until August- once this is "complete" he will be ready for the cath -Continue medical management with aspirin, Lipitor, Imdur, Toprol and Losartan   Active Problems:  New diagnosis of  cardiomyopathy with an EF of 25 to 30% - has received teaching - will see if insurance will cover [facility] to continue teaching - home meds will be> Lasix 40 mg daily, Kdur 20 meq daily, Losartan 20 mg daily (previously on 50 mg at home but not taking) and Toprol  Mild AKI - Cr rose from 1.07 to 1.58 yesterday and with holding Lasix, has improved to 1.37 - please follow as outpt     NSVT (nonsustained ventricular tachycardia) (HCC) - carvedilol switched to Toprol today by Dr Mariah Milling      Hypomagnesemia -Replaced  Amputation of right arm (HCC)    Discharge Exam: Vitals:   11/30/20 0750 11/30/20 1134  BP: 100/81 (!) 163/143  Pulse: 99 60  Resp: 18 18  Temp:  98.7 F (37.1 C)  SpO2: 95% 99%   Vitals:   11/29/20 2028 11/30/20 0432 11/30/20 0750 11/30/20 1134  BP: 126/78 108/71 100/81 (!) 163/143  Pulse: 73 76 99 60  Resp: 18 20 18 18   Temp: 98.3 F (36.8 C) 97.9 F (36.6 C)  98.7 F (37.1 C)  TempSrc: Oral Oral  Oral  SpO2: 99% 95% 95% 99%  Weight:  104.9 kg    Height:        General: Pt is alert, awake, not in acute distress Cardiovascular: RRR, S1/S2 +, no rubs, no gallops Respiratory: CTA bilaterally, no wheezing, no rhonchi Abdominal: Soft, NT, ND, bowel sounds + Extremities: no edema, no cyanosis   Discharge Instructions  Discharge Instructions    (HEART FAILURE  PATIENTS) Call MD:  Anytime you have any of the following symptoms: 1) 3 pound weight gain in 24 hours or 5 pounds in 1 week 2) shortness of breath, with or without a dry hacking cough 3) swelling in the hands, feet or stomach 4) if you have to sleep on extra pillows at night in order to breathe.   Complete by: As directed    Diet - low sodium heart healthy   Complete by: As directed    Increase activity slowly   Complete by: As directed      Allergies as of 11/30/2020   No Known Allergies     Medication List    STOP taking these medications   losartan-hydrochlorothiazide 50-12.5 MG  tablet Commonly known as: HYZAAR     TAKE these medications   atorvastatin 40 MG tablet Commonly known as: LIPITOR Take 1 tablet (40 mg total) by mouth daily.   furosemide 40 MG tablet Commonly known as: Lasix Take 1 tablet (40 mg total) by mouth daily for 30 doses.   losartan 25 MG tablet Commonly known as: COZAAR Take 1 tablet (25 mg total) by mouth daily. Start taking on: December 01, 2020   metoprolol succinate 25 MG 24 hr tablet Commonly known as: TOPROL-XL Take 1 tablet (25 mg total) by mouth daily.   nitroGLYCERIN 0.4 MG SL tablet Commonly known as: NITROSTAT Place 1 tablet (0.4 mg total) under the tongue every 5 (five) minutes as needed for chest pain.   potassium chloride 10 MEQ tablet Commonly known as: KLOR-CON Take 2 tablets (20 mEq total) by mouth daily.       Follow-up Information    Duanne Limerick, MD. Schedule an appointment as soon as possible for a visit in 1 week(s).   Specialty: Family Medicine Contact information: [address] [place] [address] [phone]              No Known Allergies    US Venous Img Lower Unilateral Right  Result Date: 11/27/2020 CLINICAL DATA:  Initial evaluation for right lower extremity swelling for 2 weeks, evaluate for DVT EXAM: RIGHT LOWER EXTREMITY VENOUS DOPPLER ULTRASOUND TECHNIQUE: Gray-scale sonography with graded compression, as well as color Doppler and duplex ultrasound were performed to evaluate the lower extremity deep venous systems from the level of the common femoral vein and including the common femoral, femoral, profunda femoral, popliteal and calf veins including the posterior tibial, peroneal and gastrocnemius veins when visible. The superficial great saphenous vein was also interrogated. Spectral Doppler was utilized to evaluate flow at rest and with distal augmentation maneuvers in the common femoral, femoral and popliteal veins. COMPARISON:  None. FINDINGS: Contralateral Common  Femoral Vein: Respiratory phasicity is normal and symmetric with the symptomatic side. No evidence of thrombus. Normal compressibility. Common Femoral Vein: No evidence of thrombus. Normal compressibility, respiratory phasicity and response to augmentation. Saphenofemoral Junction: No evidence of thrombus. Normal compressibility and flow on color Doppler imaging. Profunda Femoral Vein: No evidence of thrombus. Normal compressibility and flow on color Doppler imaging. Femoral Vein: No evidence of thrombus. Normal compressibility, respiratory phasicity and response to augmentation. Popliteal Vein: No evidence of thrombus. Normal compressibility, respiratory phasicity and response to augmentation. Calf Veins: Veins of the calf are not well visualized on this examination. Superficial Great Saphenous Vein: No evidence of thrombus. Normal compressibility. Venous Reflux:  None. Other Findings:  None. IMPRESSION: No sonographic evidence of deep venous thrombosis. Electronically Signed   By: Janell Quiet.D.  On: 11/27/2020 18:52   DG Chest Portable 1 View  Result Date: 11/27/2020 CLINICAL DATA:  Hypertension EXAM: PORTABLE CHEST 1 VIEW COMPARISON:  None. FINDINGS: Mild pulmonary vascular congestion. No pleural effusion or pneumothorax. Cardiomediastinal contours are within normal limits. IMPRESSION: Mild pulmonary vascular congestion. Electronically Signed   By: Guadlupe SpanishPraneil  Patel M.D.   On: 11/27/2020 18:19   ECHOCARDIOGRAM COMPLETE  Result Date: 11/28/2020    ECHOCARDIOGRAM REPORT   Patient Name:   Scott GroutRONNIE Ewart Date of Exam: 11/28/2020 Medical Rec #:  [id number]     Height:       75.0 in Accession #:    [id number]    Weight:       240.0 lb Date of Birth:  01/12/1951      BSA:          2.371 m Patient Age:    69 years      BP:           127/93 mmHg Patient Gender: M             HR:           83 bpm. Exam Location:  [facility] Procedure: 2D Echo, Cardiac Doppler, Color Doppler and Strain Analysis Indications:     CHF-  acute diastolic I50.31  History:         Patient has no prior history of Echocardiogram examinations.                  Risk Factors:Hypertension.  Sonographer:     Cristela BlueJerry Hege RDCS (AE) Referring Phys:  [id number] Vernetta HoneyJAN A MANSY Diagnosing Phys: Yvonne Kendallhristopher End MD  Sonographer Comments: Global longitudinal strain was attempted. IMPRESSIONS  1. Left ventricular ejection fraction, by estimation, is 25 to 30%. The left ventricle has severely decreased function. The left ventricle demonstrates global hypokinesis. There is moderate left ventricular hypertrophy. Left ventricular diastolic parameters are consistent with Grade I diastolic dysfunction (impaired relaxation). The average left ventricular global longitudinal strain is -5.1 %. The global longitudinal strain is abnormal.  2. Right ventricular systolic function was not well visualized. The right ventricular size is not well visualized. Tricuspid regurgitation signal is inadequate for assessing PA pressure.  3. The mitral valve is grossly normal. No evidence of mitral valve regurgitation. No evidence of mitral stenosis.  4. The aortic valve has an indeterminant number of cusps. There is mild calcification of the aortic valve. There is mild thickening of the aortic valve. Aortic valve regurgitation is not visualized. Mild to moderate aortic valve sclerosis/calcification is present, without any evidence of aortic stenosis. FINDINGS  Left Ventricle: Left ventricular ejection fraction, by estimation, is 25 to 30%. The left ventricle has severely decreased function. The left ventricle demonstrates global hypokinesis. The average left ventricular global longitudinal strain is -5.1 %. The global longitudinal strain is abnormal. The left ventricular internal cavity size was normal in size. There is moderate left ventricular hypertrophy. Left ventricular diastolic parameters are consistent with Grade I diastolic dysfunction (impaired relaxation). Right Ventricle: The right  ventricular size is not well visualized. Right vetricular wall thickness was not well visualized. Right ventricular systolic function was not well visualized. Tricuspid regurgitation signal is inadequate for assessing PA pressure. Left Atrium: Left atrial size was normal in size. Right Atrium: Right atrial size was normal in size. Pericardium: The pericardium was not well visualized. Mitral Valve: The mitral valve is grossly normal. No evidence of mitral valve regurgitation. No evidence of mitral valve stenosis. Tricuspid Valve: The tricuspid valve  is not well visualized. Tricuspid valve regurgitation is trivial. Aortic Valve: The aortic valve has an indeterminant number of cusps. There is mild calcification of the aortic valve. There is mild thickening of the aortic valve. Aortic valve regurgitation is not visualized. Mild to moderate aortic valve sclerosis/calcification is present, without any evidence of aortic stenosis. Aortic valve mean gradient measures 4.0 mmHg. Aortic valve peak gradient measures 6.9 mmHg. Aortic valve area, by VTI measures 2.19 cm. Pulmonic Valve: The pulmonic valve was not well visualized. Pulmonic valve regurgitation is not visualized. No evidence of pulmonic stenosis. Aorta: The aortic root is normal in size and structure. Pulmonary Artery: The pulmonary artery is not well seen. Venous: The inferior vena cava was not well visualized. IAS/Shunts: The interatrial septum was not well visualized.  LEFT VENTRICLE PLAX 2D LVIDd:         5.66 cm      Diastology LVIDs:         4.90 cm      LV e' medial:    5.00 cm/s LV PW:         1.45 cm      LV E/e' medial:  10.9 LV IVS:        1.46 cm      LV e' lateral:   3.48 cm/s LVOT diam:     2.20 cm      LV E/e' lateral: 15.6 LV SV:         49 LV SV Index:   21           2D Longitudinal Strain LVOT Area:     3.80 cm     2D Strain GLS Avg:     -5.1 %  LV Volumes (MOD) LV vol d, MOD A2C: 87.9 ml LV vol d, MOD A4C: 164.0 ml LV vol s, MOD A2C: 99.2 ml LV  vol s, MOD A4C: 123.0 ml LV SV MOD A2C:     -11.3 ml LV SV MOD A4C:     164.0 ml LV SV MOD BP:      11.8 ml LEFT ATRIUM             Index       RIGHT ATRIUM           Index LA diam:        3.40 cm 1.43 cm/m  RA Area:     17.20 cm LA Vol (A2C):   65.4 ml 27.58 ml/m RA Volume:   46.70 ml  19.69 ml/m LA Vol (A4C):   12.8 ml 5.40 ml/m LA Biplane Vol: 30.1 ml 12.69 ml/m  AORTIC VALVE                   PULMONIC VALVE AV Area (Vmax):    2.08 cm    PV Vmax:        0.65 m/s AV Area (Vmean):   1.93 cm    PV Peak grad:   1.7 mmHg AV Area (VTI):     2.19 cm    RVOT Peak grad: 4 mmHg AV Vmax:           131.00 cm/s AV Vmean:          94.400 cm/s AV VTI:            0.224 m AV Peak Grad:      6.9 mmHg AV Mean Grad:      4.0 mmHg LVOT Vmax:         71.80  cm/s LVOT Vmean:        48.000 cm/s LVOT VTI:          0.129 m LVOT/AV VTI ratio: 0.58  AORTA Ao Root diam: 3.70 cm MITRAL VALVE MV Area (PHT): 5.38 cm    SHUNTS MV Decel Time: 141 msec    Systemic VTI:  0.13 m MV E velocity: 54.40 cm/s  Systemic Diam: 2.20 cm MV A velocity: 90.80 cm/s MV E/A ratio:  0.60 Cristal Deer End MD Electronically signed by Yvonne Kendall MD Signature Date/Time: 11/28/2020/12:25:58 PM    Final      The results of significant diagnostics from this hospitalization (including imaging, microbiology, ancillary and laboratory) are listed below for reference.     Microbiology: Recent Results (from the past 240 hour(s))  SARS CORONAVIRUS 2 (TAT 6-24 HRS) Nasopharyngeal Nasopharyngeal Swab     Status: None   Collection Time: 11/27/20  8:28 PM   Specimen: Nasopharyngeal Swab  Result Value Ref Range Status   SARS Coronavirus 2 NEGATIVE NEGATIVE Final    Comment: (NOTE) SARS-CoV-2 target nucleic acids are NOT DETECTED.  The SARS-CoV-2 RNA is generally detectable in upper and lower respiratory specimens during the acute phase of infection. Negative results do not preclude SARS-CoV-2 infection, do not rule out co-infections with other  pathogens, and should not be used as the sole basis for treatment or other patient management decisions. Negative results must be combined with clinical observations, patient history, and epidemiological information. The expected result is Negative.  Fact Sheet for Patients: HairSlick.no  Fact Sheet for Healthcare Providers: quierodirigir.com  This test is not yet approved or cleared by the [place] FDA and  has been authorized for detection and/or diagnosis of SARS-CoV-2 by FDA under an Emergency Use Authorization (EUA). This EUA will remain  in effect (meaning this test can be used) for the duration of the COVID-19 declaration under Se ction 564(b)(1) of the Act, 21 U.S.C. section 360bbb-3(b)(1), unless the authorization is terminated or revoked sooner.  Performed at [facility], [address], [place], [place] [address]      Labs: BNP (last 3 results) Recent Labs    11/27/20 1818  BNP 141.1*   Basic Metabolic Panel: Recent Labs  Lab 11/27/20 1818 11/27/20 2039 11/28/20 0633 11/29/20 0710 11/30/20 0645  NA 137  --  138 139 135  K 4.4  --  3.5 4.2 3.5  CL 107  --  104 104 102  CO2 23  --  25 25 23   GLUCOSE 93  --  120* 108* 96  BUN 19  --  20 34* 38*  CREATININE 1.06  --  1.07 1.58* 1.37*  CALCIUM 9.6  --  9.7 9.8 9.3  MG  --  1.6*  --  2.1  --    Liver Function Tests: Recent Labs  Lab 11/27/20 1818  AST 30  ALT 24  ALKPHOS 81  BILITOT 1.2  PROT 7.3  ALBUMIN 3.8   No results for input(s): LIPASE, AMYLASE in the last 168 hours. No results for input(s): AMMONIA in the last 168 hours. CBC: Recent Labs  Lab 11/27/20 1818 11/28/20 0633  WBC 7.6 8.7  NEUTROABS 5.1  --   HGB 15.0 15.1  HCT 45.5 45.6  MCV 92.3 92.1  PLT 160 165   Cardiac Enzymes: No results for input(s): CKTOTAL, CKMB, CKMBINDEX, TROPONINI in the last 168 hours. BNP: Invalid input(s): POCBNP CBG: Recent Labs   Lab 11/29/20 2031  GLUCAP 104*  D-Dimer No results for input(s): DDIMER in the last 72 hours. Hgb A1c Recent Labs    11/29/20 0710  HGBA1C 4.9   Lipid Profile Recent Labs    11/29/20 0710  CHOL 198  HDL 32*  LDLCALC 147*  TRIG 93  CHOLHDL 6.2   Thyroid function studies Recent Labs    11/27/20 2039  TSH 1.745   Anemia work up No results for input(s): VITAMINB12, FOLATE, FERRITIN, TIBC, IRON, RETICCTPCT in the last 72 hours. Urinalysis No results found for: COLORURINE, APPEARANCEUR, LABSPEC, PHURINE, GLUCOSEU, HGBUR, BILIRUBINUR, KETONESUR, PROTEINUR, UROBILINOGEN, NITRITE, LEUKOCYTESUR Sepsis Labs Invalid input(s): PROCALCITONIN,  WBC,  LACTICIDVEN Microbiology Recent Results (from the past 240 hour(s))  SARS CORONAVIRUS 2 (TAT 6-24 HRS) Nasopharyngeal Nasopharyngeal Swab     Status: None   Collection Time: 11/27/20  8:28 PM   Specimen: Nasopharyngeal Swab  Result Value Ref Range Status   SARS Coronavirus 2 NEGATIVE NEGATIVE Final    Comment: (NOTE) SARS-CoV-2 target nucleic acids are NOT DETECTED.  The SARS-CoV-2 RNA is generally detectable in upper and lower respiratory specimens during the acute phase of infection. Negative results do not preclude SARS-CoV-2 infection, do not rule out co-infections with other pathogens, and should not be used as the sole basis for treatment or other patient management decisions. Negative results must be combined with clinical observations, patient history, and epidemiological information. The expected result is Negative.  Fact Sheet for Patients: HairSlick.no  Fact Sheet for Healthcare Providers: quierodirigir.com  This test is not yet approved or cleared by the [place] FDA and  has been authorized for detection and/or diagnosis of SARS-CoV-2 by FDA under an Emergency Use Authorization (EUA). This EUA will remain  in effect (meaning this test can be used) for  the duration of the COVID-19 declaration under Se ction 564(b)(1) of the Act, 21 U.S.C. section 360bbb-3(b)(1), unless the authorization is terminated or revoked sooner.  Performed at [facility], [address] [place], [place] [address]      Time coordinating discharge in minutes: 65  SIGNED:   Calvert Cantor, MD  [facility] 11/30/2020, 12:57 PM

## 2020-11-29 NOTE — Progress Notes (Signed)
Progress Note  Patient Name: Scott Meyer Date of Encounter: 11/29/2020  [facility] Cardiologist: Yvonne Kendall, MD   Subjective   Edema is improving, shortness of breath also better.  Does not want to have any invasive procedures at this point.  Inpatient Medications    Scheduled Meds: . aspirin EC  81 mg Oral Daily  . atorvastatin  40 mg Oral Daily  . carvedilol  3.125 mg Oral BID WC  . enoxaparin (LOVENOX) injection  40 mg Subcutaneous Q24H  . [START ON 11/30/2020] losartan  25 mg Oral Daily  . sodium chloride flush  10 mL Intravenous Q12H   Continuous Infusions:  PRN Meds: acetaminophen **OR** acetaminophen, magnesium hydroxide, morphine injection, nitroGLYCERIN, ondansetron **OR** ondansetron (ZOFRAN) IV, traZODone   Vital Signs    Vitals:   11/28/20 2351 11/29/20 0424 11/29/20 0500 11/29/20 0853  BP: 124/73 117/64  124/86  Pulse: 62 73  76  Resp: 16 17  18   Temp: 98.6 F (37 C) 97.9 F (36.6 C)  (!) 97.5 F (36.4 C)  TempSrc:  Oral  Oral  SpO2: 97% 97%  93%  Weight:   104.3 kg   Height:        Intake/Output Summary (Last 24 hours) at 11/29/2020 1126 Last data filed at 11/29/2020 1040 Gross per 24 hour  Intake 483 ml  Output 550 ml  Net -67 ml   Last 3 Weights 11/29/2020 11/28/2020 11/27/2020  Weight (lbs) 230 lb 230 lb 8 oz 240 lb  Weight (kg) 104.327 kg 104.554 kg 108.863 kg      Telemetry    Sinus rhythm- Personally Reviewed  ECG     - Personally Reviewed  Physical Exam   GEN: No acute distress.   Neck: No JVD Cardiac: RRR, Respiratory:  Diminished breath sounds at bases, clear anteriorly GI: Soft, nontender, non-distended  MS: Trace edema; right arm amputation noted. Neuro:  Nonfocal  Psych: Normal affect   Labs    High Sensitivity Troponin:   Recent Labs  Lab 11/27/20 1818 11/27/20 2039  TROPONINIHS 36* 46*      Chemistry Recent Labs  Lab 11/27/20 1818 11/28/20 0633 11/29/20 0710  NA 137 138 139  K 4.4 3.5 4.2   CL 107 104 104  CO2 23 25 25   GLUCOSE 93 120* 108*  BUN 19 20 34*  CREATININE 1.06 1.07 1.58*  CALCIUM 9.6 9.7 9.8  PROT 7.3  --   --   ALBUMIN 3.8  --   --   AST 30  --   --   ALT 24  --   --   ALKPHOS 81  --   --   BILITOT 1.2  --   --   GFRNONAA >60 >60 47*  ANIONGAP 7 9 10      Hematology Recent Labs  Lab 11/27/20 1818 11/28/20 0633  WBC 7.6 8.7  RBC 4.93 4.95  HGB 15.0 15.1  HCT 45.5 45.6  MCV 92.3 92.1  MCH 30.4 30.5  MCHC 33.0 33.1  RDW 12.0 12.0  PLT 160 165    BNP Recent Labs  Lab 11/27/20 1818  BNP 141.1*     DDimer No results for input(s): DDIMER in the last 168 hours.   Radiology    11/29/20 Venous Img Lower Unilateral Right  Result Date: 11/27/2020 CLINICAL DATA:  Initial evaluation for right lower extremity swelling for 2 weeks, evaluate for DVT EXAM: RIGHT LOWER EXTREMITY VENOUS DOPPLER ULTRASOUND TECHNIQUE: Gray-scale sonography with graded compression, as  well as color Doppler and duplex ultrasound were performed to evaluate the lower extremity deep venous systems from the level of the common femoral vein and including the common femoral, femoral, profunda femoral, popliteal and calf veins including the posterior tibial, peroneal and gastrocnemius veins when visible. The superficial great saphenous vein was also interrogated. Spectral Doppler was utilized to evaluate flow at rest and with distal augmentation maneuvers in the common femoral, femoral and popliteal veins. COMPARISON:  None. FINDINGS: Contralateral Common Femoral Vein: Respiratory phasicity is normal and symmetric with the symptomatic side. No evidence of thrombus. Normal compressibility. Common Femoral Vein: No evidence of thrombus. Normal compressibility, respiratory phasicity and response to augmentation. Saphenofemoral Junction: No evidence of thrombus. Normal compressibility and flow on color Doppler imaging. Profunda Femoral Vein: No evidence of thrombus. Normal compressibility and flow on  color Doppler imaging. Femoral Vein: No evidence of thrombus. Normal compressibility, respiratory phasicity and response to augmentation. Popliteal Vein: No evidence of thrombus. Normal compressibility, respiratory phasicity and response to augmentation. Calf Veins: Veins of the calf are not well visualized on this examination. Superficial Great Saphenous Vein: No evidence of thrombus. Normal compressibility. Venous Reflux:  None. Other Findings:  None. IMPRESSION: No sonographic evidence of deep venous thrombosis. Electronically Signed   By: Rise MuBenjamin  McClintock M.D.   On: 11/27/2020 18:52   DG Chest Portable 1 View  Result Date: 11/27/2020 CLINICAL DATA:  Hypertension EXAM: PORTABLE CHEST 1 VIEW COMPARISON:  None. FINDINGS: Mild pulmonary vascular congestion. No pleural effusion or pneumothorax. Cardiomediastinal contours are within normal limits. IMPRESSION: Mild pulmonary vascular congestion. Electronically Signed   By: Guadlupe SpanishPraneil  Patel M.D.   On: 11/27/2020 18:19   ECHOCARDIOGRAM COMPLETE  Result Date: 11/28/2020    ECHOCARDIOGRAM REPORT   Patient Name:   Scott Meyer Date of Exam: 11/28/2020 Medical Rec #:  [id number]     Height:       75.0 in Accession #:    [id number]    Weight:       240.0 lb Date of Birth:  01/07/1951      BSA:          2.371 m Patient Age:    70 years      BP:           127/93 mmHg Patient Gender: M             HR:           83 bpm. Exam Location:  [facility] Procedure: 2D Echo, Cardiac Doppler, Color Doppler and Strain Analysis Indications:     CHF- acute diastolic I50.31  History:         Patient has no prior history of Echocardiogram examinations.                  Risk Factors:Hypertension.  Sonographer:     Cristela BlueJerry Hege RDCS (AE) Referring Phys:  [id number] Vernetta HoneyJAN A MANSY Diagnosing Phys: Yvonne Kendallhristopher End MD  Sonographer Comments: Global longitudinal strain was attempted. IMPRESSIONS  1. Left ventricular ejection fraction, by estimation, is 25 to 30%. The left ventricle has severely decreased  function. The left ventricle demonstrates global hypokinesis. There is moderate left ventricular hypertrophy. Left ventricular diastolic parameters are consistent with Grade I diastolic dysfunction (impaired relaxation). The average left ventricular global longitudinal strain is -5.1 %. The global longitudinal strain is abnormal.  2. Right ventricular systolic function was not well visualized. The right ventricular size is not well visualized. Tricuspid regurgitation signal is inadequate for assessing PA pressure.  3. The mitral valve is grossly normal. No evidence of mitral valve regurgitation. No evidence of mitral stenosis.  4. The aortic valve has an indeterminant number of cusps. There is mild calcification of the aortic valve. There is mild thickening of the aortic valve. Aortic valve regurgitation is not visualized. Mild to moderate aortic valve sclerosis/calcification is present, without any evidence of aortic stenosis. FINDINGS  Left Ventricle: Left ventricular ejection fraction, by estimation, is 25 to 30%. The left ventricle has severely decreased function. The left ventricle demonstrates global hypokinesis. The average left ventricular global longitudinal strain is -5.1 %. The global longitudinal strain is abnormal. The left ventricular internal cavity size was normal in size. There is moderate left ventricular hypertrophy. Left ventricular diastolic parameters are consistent with Grade I diastolic dysfunction (impaired relaxation). Right Ventricle: The right ventricular size is not well visualized. Right vetricular wall thickness was not well visualized. Right ventricular systolic function was not well visualized. Tricuspid regurgitation signal is inadequate for assessing PA pressure. Left Atrium: Left atrial size was normal in size. Right Atrium: Right atrial size was normal in size. Pericardium: The pericardium was not well visualized. Mitral Valve: The mitral valve is grossly normal. No evidence of  mitral valve regurgitation. No evidence of mitral valve stenosis. Tricuspid Valve: The tricuspid valve is not well visualized. Tricuspid valve regurgitation is trivial. Aortic Valve: The aortic valve has an indeterminant number of cusps. There is mild calcification of the aortic valve. There is mild thickening of the aortic valve. Aortic valve regurgitation is not visualized. Mild to moderate aortic valve sclerosis/calcification is present, without any evidence of aortic stenosis. Aortic valve mean gradient measures 4.0 mmHg. Aortic valve peak gradient measures 6.9 mmHg. Aortic valve area, by VTI measures 2.19 cm. Pulmonic Valve: The pulmonic valve was not well visualized. Pulmonic valve regurgitation is not visualized. No evidence of pulmonic stenosis. Aorta: The aortic root is normal in size and structure. Pulmonary Artery: The pulmonary artery is not well seen. Venous: The inferior vena cava was not well visualized. IAS/Shunts: The interatrial septum was not well visualized.  LEFT VENTRICLE PLAX 2D LVIDd:         5.66 cm      Diastology LVIDs:         4.90 cm      LV e' medial:    5.00 cm/s LV PW:         1.45 cm      LV E/e' medial:  10.9 LV IVS:        1.46 cm      LV e' lateral:   3.48 cm/s LVOT diam:     2.20 cm      LV E/e' lateral: 15.6 LV SV:         49 LV SV Index:   21           2D Longitudinal Strain LVOT Area:     3.80 cm     2D Strain GLS Avg:     -5.1 %  LV Volumes (MOD) LV vol d, MOD A2C: 87.9 ml LV vol d, MOD A4C: 164.0 ml LV vol s, MOD A2C: 99.2 ml LV vol s, MOD A4C: 123.0 ml LV SV MOD A2C:     -11.3 ml LV SV MOD A4C:     164.0 ml LV SV MOD BP:      11.8 ml LEFT ATRIUM             Index  RIGHT ATRIUM           Index LA diam:        3.40 cm 1.43 cm/m  RA Area:     17.20 cm LA Vol (A2C):   65.4 ml 27.58 ml/m RA Volume:   46.70 ml  19.69 ml/m LA Vol (A4C):   12.8 ml 5.40 ml/m LA Biplane Vol: 30.1 ml 12.69 ml/m  AORTIC VALVE                   PULMONIC VALVE AV Area (Vmax):    2.08 cm     PV Vmax:        0.65 m/s AV Area (Vmean):   1.93 cm    PV Peak grad:   1.7 mmHg AV Area (VTI):     2.19 cm    RVOT Peak grad: 4 mmHg AV Vmax:           131.00 cm/s AV Vmean:          94.400 cm/s AV VTI:            0.224 m AV Peak Grad:      6.9 mmHg AV Mean Grad:      4.0 mmHg LVOT Vmax:         71.80 cm/s LVOT Vmean:        48.000 cm/s LVOT VTI:          0.129 m LVOT/AV VTI ratio: 0.58  AORTA Ao Root diam: 3.70 cm MITRAL VALVE MV Area (PHT): 5.38 cm    SHUNTS MV Decel Time: 141 msec    Systemic VTI:  0.13 m MV E velocity: 54.40 cm/s  Systemic Diam: 2.20 cm MV A velocity: 90.80 cm/s MV E/A ratio:  0.60 Cristal Deer End MD Electronically signed by Yvonne Kendall MD Signature Date/Time: 11/28/2020/12:25:58 PM    Final     Cardiac Studies   Echo 11/28/2020 1. Left ventricular ejection fraction, by estimation, is 25 to 30%. The  left ventricle has severely decreased function. The left ventricle  demonstrates global hypokinesis. There is moderate left ventricular  hypertrophy. Left ventricular diastolic  parameters are consistent with Grade I diastolic dysfunction (impaired  relaxation). The average left ventricular global longitudinal strain is  -5.1 %. The global longitudinal strain is abnormal.  2. Right ventricular systolic function was not well visualized. The right  ventricular size is not well visualized. Tricuspid regurgitation signal is  inadequate for assessing PA pressure.  3. The mitral valve is grossly normal. No evidence of mitral valve  regurgitation. No evidence of mitral stenosis.  4. The aortic valve has an indeterminant number of cusps. There is mild  calcification of the aortic valve. There is mild thickening of the aortic  valve. Aortic valve regurgitation is not visualized. Mild to moderate  aortic valve sclerosis/calcification  is present, without any evidence of aortic stenosis.  Patient Profile     70 y.o. male with history of hypertension presenting with lower  extremity edema shortness of breath.  Found to have severely reduced ejection fraction, EF 25 to 30%.  Assessment & Plan    1.  Cardiomyopathy, EF 25 to 30% -Declined ischemic work-up via left heart cath.  -Shortness of breath, edema improved, creatinine worsened. -Hold Lasix today, decrease losartan to 25 mg daily. -Continue Coreg -Continue aspirin, Lipitor as patient may have ischemic causes. -Consider reduced dose of Lasix if creatinine improves or normalizes in a day also.  2.  Hypertension -Coreg, losartan.  Total encounter time 35 minutes  Greater than 50% was spent in counseling and coordination of care with the patient Discharge pending stabilization/improvement in renal function.  I think patient will need some dose of Lasix on discharge.   Signed, Debbe Odea, MD  11/29/2020, 11:26 AM

## 2020-11-29 NOTE — TOC Initial Note (Addendum)
Transition of Care [facility]) - Initial/Assessment Note    Patient Details  Name: Scott Meyer MRN: [id number] Date of Birth: [birth year]  Transition of Care [facility]) CM/SW Contact:    Hetty Ely, RN Phone Number: 11/29/2020, 2:50 PM  Clinical Narrative:  Spoke with patient who lives with sister, states he drives, able to shop and cook. Not currently taking prescribed medication, no PCP. Patient says he will take medications if prescribed, however waiting on something to occur in his body in August, then he can proceed with doctor plan. Patient states he is on a special diet, and will go 3-5 days without bowel movement and do not want to take medication to make them move. If discharged today will have sister to pick him up.                Expected Discharge Plan: Home/Self Care Barriers to Discharge: Continued Medical Work up   Patient Goals and CMS Choice Choice offered to / list presented to : NA  Expected Discharge Plan and Services Expected Discharge Plan: Home/Self Care   Discharge Planning Services: NA Post Acute Care Choice: NA                 DME Arranged: N/A DME Agency: NA       HH Arranged: NA HH Agency: NA        Prior Living Arrangements/Services Living arrangements for the past 2 months: Apartment Lives with:: Spouse Patient language and need for interpreter reviewed:: Yes Do you feel safe going back to the place where you live?: Yes      Need for Family Participation in Patient Care: Yes (Comment) Care giver support system in place?: Yes (comment)   Criminal Activity/Legal Involvement Pertinent to Current Situation/Hospitalization: No - Comment as needed  Activities of Daily Living Home Assistive Devices/Equipment: Eyeglasses ADL Screening (condition at time of admission) Patient's cognitive ability adequate to safely complete daily activities?: Yes Is the patient deaf or have difficulty hearing?: No Does the patient have difficulty seeing, even when  wearing glasses/contacts?: No Does the patient have difficulty concentrating, remembering, or making decisions?: No Patient able to express need for assistance with ADLs?: Yes Does the patient have difficulty dressing or bathing?: No Independently performs ADLs?: Yes (appropriate for developmental age) Does the patient have difficulty walking or climbing stairs?: No Weakness of Legs: None Weakness of Arms/Hands: None  Permission Sought/Granted                  Emotional Assessment Appearance:: Appears stated age Attitude/Demeanor/Rapport: Engaged Affect (typically observed): Accepting Orientation: : Oriented to Self,Oriented to Place,Oriented to  Time,Oriented to Situation Alcohol / Substance Use: Not Applicable Psych Involvement: No (comment)  Admission diagnosis:  Peripheral edema [R60.9] SOB (shortness of breath) [R06.02] Chest pain [R07.9] Hypertension, unspecified type [I10] Patient Active Problem List   Diagnosis Date Noted   Troponin level elevated 11/29/2020   Benign essential HTN 11/29/2020   HLD (hyperlipidemia) 11/29/2020   Hypomagnesemia 11/29/2020   Amputation of right arm (HCC) 11/29/2020   Acute combined systolic and diastolic congestive heart failure (HCC) 11/29/2020   NSVT (nonsustained ventricular tachycardia) (HCC) 11/29/2020   Chest pain 11/27/2020   PCP:  Duanne Limerick, MD Pharmacy:   [facility] [phone] Dan Humphreys, [place] - [address] AT [facility] OF [name] [facility] [place] [address] Phone: [phone] Fax: [phone]     Social Determinants of Health (SDOH) Interventions  Readmission Risk Interventions No flowsheet data found.

## 2020-11-29 NOTE — Progress Notes (Signed)
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 Noted to have frequent PVCs on EKG.  Blood pressure was noted to be in 190s to 200s.  Initial troponin was 36. Chest x-ray revealed mild pulmonary vascular congestion. Right lower extremity venous duplex was negative for DVT Started on IV Lasix for pulmonary edema.  Cardiology consult requested.  2D echo revealed an EF of 25 to 30%.  Telemetry revealed short episodes of NSVT.  Subjective: No complaints.  Has numerous questions about heart failure    Assessment & Plan:   Principal Problem:   Chest pain New diagnosis of cardiomyopathy with an EF of 25 to 30% -Troponin max was 69 -cardiology is recommending a cardiac cath but the patient has declined this -Continue medical management with aspirin, Lipitor, carvedilol, ARB -Patient was on losartan/HCTZ at home-HCTZ was discontinued -Creatinine has risen after being given IV Lasix and therefore Lasix is held for now -Dose of losartan has been decreased to 25 mg daily -Continue to follow in hospital until creatinine improves  Active Problems:     NSVT (nonsustained ventricular tachycardia) (HCC) -Continue carvedilol and follow on telemetry      Hypomagnesemia -Replaced  Amputation of right arm (HCC)   Time spent in minutes: 35 DVT prophylaxis: enoxaparin (LOVENOX) injection 40 mg Start: 11/28/20 2200  Code Status: Full code Family Communication:  Level of Care: Level of care: Progressive Cardiac Disposition Plan:  Status is: Observation  The patient will require care spanning > 2 midnights and should be moved to inpatient because: Ongoing diagnostic testing needed not appropriate for outpatient work up  Dispo: The patient is from: Home              Anticipated d/c is to: Home              Patient  currently is not medically stable to d/c.   Difficult to place patient No      Consultants:   Cardiology Procedures:   2D echo Antimicrobials:  Anti-infectives (From admission, onward)   None       Objective: Vitals:   11/29/20 0424 11/29/20 0500 11/29/20 0853 11/29/20 1136  BP: 117/64  124/86 98/80  Pulse: 73  76 72  Resp: 17  18 19   Temp: 97.9 F (36.6 C)  (!) 97.5 F (36.4 C) (!) 97.4 F (36.3 C)  TempSrc: Oral  Oral Oral  SpO2: 97%  93% 96%  Weight:  104.3 kg    Height:        Intake/Output Summary (Last 24 hours) at 11/29/2020 1558 Last data filed at 11/29/2020 1040 Gross per 24 hour  Intake 243 ml  Output 550 ml  Net -307 ml   Filed Weights   11/27/20 1751 11/28/20 1119 11/29/20 0500  Weight: 108.9 kg 104.6 kg 104.3 kg    Examination: General exam: Appears comfortable  HEENT: PERRLA, oral mucosa moist, no sclera icterus or thrush Respiratory system: Clear to auscultation. Respiratory effort normal. Cardiovascular system: S1 & S2 heard, RRR.   Gastrointestinal system: Abdomen soft, non-tender, nondistended. Normal bowel sounds. Central nervous system: Alert and oriented. No focal neurological deficits. Extremities: No cyanosis, clubbing -right lower extremity discoloration and swelling Skin: No rashes or ulcers Psychiatry:  Mood & affect appropriate.     Data Reviewed: I have personally  reviewed following labs and imaging studies  CBC: Recent Labs  Lab 11/27/20 1818 11/28/20 0633  WBC 7.6 8.7  NEUTROABS 5.1  --   HGB 15.0 15.1  HCT 45.5 45.6  MCV 92.3 92.1  PLT 160 165   Basic Metabolic Panel: Recent Labs  Lab 11/27/20 1818 11/27/20 2039 11/28/20 0633 11/29/20 0710  NA 137  --  138 139  K 4.4  --  3.5 4.2  CL 107  --  104 104  CO2 23  --  25 25  GLUCOSE 93  --  120* 108*  BUN 19  --  20 34*  CREATININE 1.06  --  1.07 1.58*  CALCIUM 9.6  --  9.7 9.8  MG  --  1.6*  --  2.1   GFR: Estimated Creatinine Clearance: 56.9 mL/min  (A) (by C-G formula based on SCr of 1.58 mg/dL (H)). Liver Function Tests: Recent Labs  Lab 11/27/20 1818  AST 30  ALT 24  ALKPHOS 81  BILITOT 1.2  PROT 7.3  ALBUMIN 3.8   No results for input(s): LIPASE, AMYLASE in the last 168 hours. No results for input(s): AMMONIA in the last 168 hours. Coagulation Profile: No results for input(s): INR, PROTIME in the last 168 hours. Cardiac Enzymes: No results for input(s): CKTOTAL, CKMB, CKMBINDEX, TROPONINI in the last 168 hours. BNP (last 3 results) No results for input(s): PROBNP in the last 8760 hours. HbA1C: Recent Labs    11/29/20 0710  HGBA1C 4.9   CBG: No results for input(s): GLUCAP in the last 168 hours. Lipid Profile: Recent Labs    11/29/20 0710  CHOL 198  HDL 32*  LDLCALC 147*  TRIG 93  CHOLHDL 6.2   Thyroid Function Tests: Recent Labs    11/27/20 2039  TSH 1.745   Anemia Panel: No results for input(s): VITAMINB12, FOLATE, FERRITIN, TIBC, IRON, RETICCTPCT in the last 72 hours. Urine analysis: No results found for: COLORURINE, APPEARANCEUR, LABSPEC, PHURINE, GLUCOSEU, HGBUR, BILIRUBINUR, KETONESUR, PROTEINUR, UROBILINOGEN, NITRITE, LEUKOCYTESUR Sepsis Labs: @LABRCNTIP (procalcitonin:4,lacticidven:4) ) Recent Results (from the past 240 hour(s))  SARS CORONAVIRUS 2 (TAT 6-24 HRS) Nasopharyngeal Nasopharyngeal Swab     Status: None   Collection Time: 11/27/20  8:28 PM   Specimen: Nasopharyngeal Swab  Result Value Ref Range Status   SARS Coronavirus 2 NEGATIVE NEGATIVE Final    Comment: (NOTE) SARS-CoV-2 target nucleic acids are NOT DETECTED.  The SARS-CoV-2 RNA is generally detectable in upper and lower respiratory specimens during the acute phase of infection. Negative results do not preclude SARS-CoV-2 infection, do not rule out co-infections with other pathogens, and should not be used as the sole basis for treatment or other patient management decisions. Negative results must be combined with clinical  observations, patient history, and epidemiological information. The expected result is Negative.  Fact Sheet for Patients: 11/29/20  Fact Sheet for Healthcare Providers: HairSlick.no  This test is not yet approved or cleared by the quierodirigir.com FDA and  has been authorized for detection and/or diagnosis of SARS-CoV-2 by FDA under an Emergency Use Authorization (EUA). This EUA will remain  in effect (meaning this test can be used) for the duration of the COVID-19 declaration under Se ction 564(b)(1) of the Act, 21 U.S.C. section 360bbb-3(b)(1), unless the authorization is terminated or revoked sooner.  Performed at [facility], [address] [place], [place]          Radiology Studies: 17510 Venous Img Lower Unilateral Right  Result Date: 11/27/2020  CLINICAL DATA:  Initial evaluation for right lower extremity swelling for 2 weeks, evaluate for DVT EXAM: RIGHT LOWER EXTREMITY VENOUS DOPPLER ULTRASOUND TECHNIQUE: Gray-scale sonography with graded compression, as well as color Doppler and duplex ultrasound were performed to evaluate the lower extremity deep venous systems from the level of the common femoral vein and including the common femoral, femoral, profunda femoral, popliteal and calf veins including the posterior tibial, peroneal and gastrocnemius veins when visible. The superficial great saphenous vein was also interrogated. Spectral Doppler was utilized to evaluate flow at rest and with distal augmentation maneuvers in the common femoral, femoral and popliteal veins. COMPARISON:  None. FINDINGS: Contralateral Common Femoral Vein: Respiratory phasicity is normal and symmetric with the symptomatic side. No evidence of thrombus. Normal compressibility. Common Femoral Vein: No evidence of thrombus. Normal compressibility, respiratory phasicity and response to augmentation. Saphenofemoral Junction: No  evidence of thrombus. Normal compressibility and flow on color Doppler imaging. Profunda Femoral Vein: No evidence of thrombus. Normal compressibility and flow on color Doppler imaging. Femoral Vein: No evidence of thrombus. Normal compressibility, respiratory phasicity and response to augmentation. Popliteal Vein: No evidence of thrombus. Normal compressibility, respiratory phasicity and response to augmentation. Calf Veins: Veins of the calf are not well visualized on this examination. Superficial Great Saphenous Vein: No evidence of thrombus. Normal compressibility. Venous Reflux:  None. Other Findings:  None. IMPRESSION: No sonographic evidence of deep venous thrombosis. Electronically Signed   By: Rise Mu M.D.   On: 11/27/2020 18:52   DG Chest Portable 1 View  Result Date: 11/27/2020 CLINICAL DATA:  Hypertension EXAM: PORTABLE CHEST 1 VIEW COMPARISON:  None. FINDINGS: Mild pulmonary vascular congestion. No pleural effusion or pneumothorax. Cardiomediastinal contours are within normal limits. IMPRESSION: Mild pulmonary vascular congestion. Electronically Signed   By: Guadlupe Spanish M.D.   On: 11/27/2020 18:19   ECHOCARDIOGRAM COMPLETE  Result Date: 11/28/2020    ECHOCARDIOGRAM REPORT   Patient Name:   Scott Meyer Date of Exam: 11/28/2020 Medical Rec #:  [id number]     Height:       75.0 in Accession #:    [id number]    Weight:       240.0 lb Date of Birth:  1951/08/16      BSA:          2.371 m Patient Age:    69 years      BP:           127/93 mmHg Patient Gender: M             HR:           83 bpm. Exam Location:  [facility] Procedure: 2D Echo, Cardiac Doppler, Color Doppler and Strain Analysis Indications:     CHF- acute diastolic I50.31  History:         Patient has no prior history of Echocardiogram examinations.                  Risk Factors:Hypertension.  Sonographer:     Cristela Blue RDCS (AE) Referring Phys:  [id number] Vernetta Honey MANSY Diagnosing Phys: Yvonne Kendall MD  Sonographer Comments:  Global longitudinal strain was attempted. IMPRESSIONS  1. Left ventricular ejection fraction, by estimation, is 25 to 30%. The left ventricle has severely decreased function. The left ventricle demonstrates global hypokinesis. There is moderate left ventricular hypertrophy. Left ventricular diastolic parameters are consistent with Grade I diastolic dysfunction (impaired relaxation). The average left ventricular global longitudinal strain is -5.1 %. The global longitudinal  strain is abnormal.  2. Right ventricular systolic function was not well visualized. The right ventricular size is not well visualized. Tricuspid regurgitation signal is inadequate for assessing PA pressure.  3. The mitral valve is grossly normal. No evidence of mitral valve regurgitation. No evidence of mitral stenosis.  4. The aortic valve has an indeterminant number of cusps. There is mild calcification of the aortic valve. There is mild thickening of the aortic valve. Aortic valve regurgitation is not visualized. Mild to moderate aortic valve sclerosis/calcification is present, without any evidence of aortic stenosis. FINDINGS  Left Ventricle: Left ventricular ejection fraction, by estimation, is 25 to 30%. The left ventricle has severely decreased function. The left ventricle demonstrates global hypokinesis. The average left ventricular global longitudinal strain is -5.1 %. The global longitudinal strain is abnormal. The left ventricular internal cavity size was normal in size. There is moderate left ventricular hypertrophy. Left ventricular diastolic parameters are consistent with Grade I diastolic dysfunction (impaired relaxation). Right Ventricle: The right ventricular size is not well visualized. Right vetricular wall thickness was not well visualized. Right ventricular systolic function was not well visualized. Tricuspid regurgitation signal is inadequate for assessing PA pressure. Left Atrium: Left atrial size was normal in size. Right  Atrium: Right atrial size was normal in size. Pericardium: The pericardium was not well visualized. Mitral Valve: The mitral valve is grossly normal. No evidence of mitral valve regurgitation. No evidence of mitral valve stenosis. Tricuspid Valve: The tricuspid valve is not well visualized. Tricuspid valve regurgitation is trivial. Aortic Valve: The aortic valve has an indeterminant number of cusps. There is mild calcification of the aortic valve. There is mild thickening of the aortic valve. Aortic valve regurgitation is not visualized. Mild to moderate aortic valve sclerosis/calcification is present, without any evidence of aortic stenosis. Aortic valve mean gradient measures 4.0 mmHg. Aortic valve peak gradient measures 6.9 mmHg. Aortic valve area, by VTI measures 2.19 cm. Pulmonic Valve: The pulmonic valve was not well visualized. Pulmonic valve regurgitation is not visualized. No evidence of pulmonic stenosis. Aorta: The aortic root is normal in size and structure. Pulmonary Artery: The pulmonary artery is not well seen. Venous: The inferior vena cava was not well visualized. IAS/Shunts: The interatrial septum was not well visualized.  LEFT VENTRICLE PLAX 2D LVIDd:         5.66 cm      Diastology LVIDs:         4.90 cm      LV e' medial:    5.00 cm/s LV PW:         1.45 cm      LV E/e' medial:  10.9 LV IVS:        1.46 cm      LV e' lateral:   3.48 cm/s LVOT diam:     2.20 cm      LV E/e' lateral: 15.6 LV SV:         49 LV SV Index:   21           2D Longitudinal Strain LVOT Area:     3.80 cm     2D Strain GLS Avg:     -5.1 %  LV Volumes (MOD) LV vol d, MOD A2C: 87.9 ml LV vol d, MOD A4C: 164.0 ml LV vol s, MOD A2C: 99.2 ml LV vol s, MOD A4C: 123.0 ml LV SV MOD A2C:     -11.3 ml LV SV MOD A4C:     164.0 ml  LV SV MOD BP:      11.8 ml LEFT ATRIUM             Index       RIGHT ATRIUM           Index LA diam:        3.40 cm 1.43 cm/m  RA Area:     17.20 cm LA Vol (A2C):   65.4 ml 27.58 ml/m RA Volume:   46.70  ml  19.69 ml/m LA Vol (A4C):   12.8 ml 5.40 ml/m LA Biplane Vol: 30.1 ml 12.69 ml/m  AORTIC VALVE                   PULMONIC VALVE AV Area (Vmax):    2.08 cm    PV Vmax:        0.65 m/s AV Area (Vmean):   1.93 cm    PV Peak grad:   1.7 mmHg AV Area (VTI):     2.19 cm    RVOT Peak grad: 4 mmHg AV Vmax:           131.00 cm/s AV Vmean:          94.400 cm/s AV VTI:            0.224 m AV Peak Grad:      6.9 mmHg AV Mean Grad:      4.0 mmHg LVOT Vmax:         71.80 cm/s LVOT Vmean:        48.000 cm/s LVOT VTI:          0.129 m LVOT/AV VTI ratio: 0.58  AORTA Ao Root diam: 3.70 cm MITRAL VALVE MV Area (PHT): 5.38 cm    SHUNTS MV Decel Time: 141 msec    Systemic VTI:  0.13 m MV E velocity: 54.40 cm/s  Systemic Diam: 2.20 cm MV A velocity: 90.80 cm/s MV E/A ratio:  0.60 Cristal Deer End MD Electronically signed by Yvonne Kendall MD Signature Date/Time: 11/28/2020/12:25:58 PM    Final       Scheduled Meds: . aspirin EC  81 mg Oral Daily  . atorvastatin  40 mg Oral Daily  . carvedilol  3.125 mg Oral BID WC  . enoxaparin (LOVENOX) injection  40 mg Subcutaneous Q24H  . [START ON 11/30/2020] losartan  25 mg Oral Daily  . sodium chloride flush  10 mL Intravenous Q12H   Continuous Infusions:   LOS: 0 days      Calvert Cantor, MD [facility] Pager: [email or web address] 11/29/2020, 3:58 PM

## 2020-11-30 ENCOUNTER — Telehealth: Payer: Self-pay

## 2020-11-30 DIAGNOSIS — Z20822 Contact with and (suspected) exposure to covid-19: Secondary | ICD-10-CM | POA: Diagnosis present

## 2020-11-30 DIAGNOSIS — I471 Supraventricular tachycardia: Secondary | ICD-10-CM

## 2020-11-30 DIAGNOSIS — Z89211 Acquired absence of right upper limb below elbow: Secondary | ICD-10-CM | POA: Diagnosis not present

## 2020-11-30 DIAGNOSIS — I420 Dilated cardiomyopathy: Secondary | ICD-10-CM | POA: Diagnosis not present

## 2020-11-30 DIAGNOSIS — Z79899 Other long term (current) drug therapy: Secondary | ICD-10-CM | POA: Diagnosis not present

## 2020-11-30 DIAGNOSIS — I495 Sick sinus syndrome: Secondary | ICD-10-CM | POA: Diagnosis not present

## 2020-11-30 DIAGNOSIS — I429 Cardiomyopathy, unspecified: Secondary | ICD-10-CM | POA: Diagnosis present

## 2020-11-30 DIAGNOSIS — R609 Edema, unspecified: Secondary | ICD-10-CM | POA: Diagnosis present

## 2020-11-30 DIAGNOSIS — L280 Lichen simplex chronicus: Secondary | ICD-10-CM | POA: Diagnosis present

## 2020-11-30 DIAGNOSIS — I248 Other forms of acute ischemic heart disease: Secondary | ICD-10-CM | POA: Diagnosis present

## 2020-11-30 DIAGNOSIS — I110 Hypertensive heart disease with heart failure: Secondary | ICD-10-CM | POA: Diagnosis present

## 2020-11-30 DIAGNOSIS — I493 Ventricular premature depolarization: Secondary | ICD-10-CM | POA: Diagnosis present

## 2020-11-30 DIAGNOSIS — R778 Other specified abnormalities of plasma proteins: Secondary | ICD-10-CM | POA: Diagnosis not present

## 2020-11-30 DIAGNOSIS — I4719 Other supraventricular tachycardia: Secondary | ICD-10-CM

## 2020-11-30 DIAGNOSIS — E785 Hyperlipidemia, unspecified: Secondary | ICD-10-CM | POA: Diagnosis present

## 2020-11-30 DIAGNOSIS — N179 Acute kidney failure, unspecified: Secondary | ICD-10-CM | POA: Diagnosis not present

## 2020-11-30 DIAGNOSIS — Z87891 Personal history of nicotine dependence: Secondary | ICD-10-CM | POA: Diagnosis not present

## 2020-11-30 DIAGNOSIS — I5041 Acute combined systolic (congestive) and diastolic (congestive) heart failure: Secondary | ICD-10-CM | POA: Diagnosis present

## 2020-11-30 DIAGNOSIS — I5021 Acute systolic (congestive) heart failure: Secondary | ICD-10-CM | POA: Diagnosis present

## 2020-11-30 DIAGNOSIS — I472 Ventricular tachycardia: Secondary | ICD-10-CM | POA: Diagnosis present

## 2020-11-30 DIAGNOSIS — I160 Hypertensive urgency: Secondary | ICD-10-CM | POA: Diagnosis present

## 2020-11-30 LAB — BASIC METABOLIC PANEL
Anion gap: 10 (ref 5–15)
BUN: 38 mg/dL — ABNORMAL HIGH (ref 8–23)
CO2: 23 mmol/L (ref 22–32)
Calcium: 9.3 mg/dL (ref 8.9–10.3)
Chloride: 102 mmol/L (ref 98–111)
Creatinine, Ser: 1.37 mg/dL — ABNORMAL HIGH (ref 0.61–1.24)
GFR, Estimated: 55 mL/min — ABNORMAL LOW (ref >60)
Glucose, Bld: 96 mg/dL (ref 70–99)
Potassium: 3.5 mmol/L (ref 3.5–5.1)
Sodium: 135 mmol/L (ref 135–145)

## 2020-11-30 MED ORDER — POTASSIUM CHLORIDE ER 10 MEQ PO TBCR: 20.0000 meq | EXTENDED_RELEASE_TABLET | Freq: Every day | ORAL | 0 refills | Status: DC

## 2020-11-30 MED ORDER — METOPROLOL SUCCINATE ER 25 MG PO TB24: 25.0000 mg | ORAL_TABLET | Freq: Every day | ORAL | 0 refills | Status: DC

## 2020-11-30 MED ORDER — NITROGLYCERIN 0.4 MG SL SUBL: 0.4000 mg | SUBLINGUAL_TABLET | SUBLINGUAL | 0 refills | Status: DC | PRN

## 2020-11-30 MED ORDER — LOSARTAN POTASSIUM 25 MG PO TABS: 25.0000 mg | ORAL_TABLET | Freq: Every day | ORAL | 0 refills | Status: DC

## 2020-11-30 MED ORDER — ATORVASTATIN CALCIUM 40 MG PO TABS: 40.0000 mg | ORAL_TABLET | Freq: Every day | ORAL | 0 refills | Status: DC

## 2020-11-30 MED ORDER — METOPROLOL SUCCINATE ER 25 MG PO TB24: 25.0000 mg | ORAL_TABLET | Freq: Every day | ORAL | Status: DC

## 2020-11-30 MED ORDER — POTASSIUM CHLORIDE CRYS ER 20 MEQ PO TBCR
40.0000 meq | EXTENDED_RELEASE_TABLET | Freq: Once | ORAL | Status: AC
  Administered 2020-11-30: 40 meq via ORAL
  Filled 2020-11-30: qty 2

## 2020-11-30 MED ORDER — FUROSEMIDE 40 MG PO TABS: 40.0000 mg | ORAL_TABLET | Freq: Every day | ORAL | 0 refills | Status: DC

## 2020-11-30 NOTE — Progress Notes (Addendum)
   Heart Failure Nurse Navigator Note  Hf EF 25 to 30%  He presented to the emergency room from urgent care with complaints of right leg swelling, dyspnea on exertion and chest discomfort.  Comorbidities:  Hypertension   Medications:  Aspirin 81 mg daily  atorvastatin 40 mg daily Losartan 25 mg daily Metoprolol succinate 25 mg daily   Labs:  Sodium 135, potassium 3.5, chloride 102, CO2 23, BUN 38 up from 34 of yesterday, creatinine 1.37 down from 1.58 of yesterday. Intake 3 cc Output 400 cc Weight 104.9 Blood pressure 163/143   Assessment:   General-he is awake and alert sitting up on the bedside, had been looking at the heart failure video.  HEENT-pupils are equal, poor dentition  Cardiac-heart tones of regular rate and rhythm.  Chest-breath sounds are clear to posterior auscultation  Abdomen soft nontender.  Musculoskeletal-  Psych-is pleasant and appropriate, makes eye contact  Neurologic-speech is clear he moves all extremities without difficulty.    Met with patient today to reinforce the importance of daily weight.  He was questioning if he should eat 3 meals a day as he was afraid that that would make his weight go up.  Explained  that he should eat 3 meals a day if he is hungry but to abstain from foods that are high in sodium.  And also to monitor his fluid intake.  Tried to explain to him that the reason for monitoring the weights is not from the foods that he is eating but from the fluid that he might take on if he is eating foods that contain high sodium content.  Stressed to call the doctor for 2 to 3 pounds overnight or 5 pounds within a week.  He states that while watching the heart failure video his vision became distorted he could hear it but could not see the screen.  He also states that he is an introvert and was not sure that he was going to keep his appointments he says now all he does is go to the grocery store at home.  Stressed the  importance with his heart that he follow-up in the heart failure clinic along with his doctor.  With all the time that I have spent with this gentleman  just do not feel that he has a good understanding.  Pricilla Riffle RN [facility]

## 2020-11-30 NOTE — Progress Notes (Addendum)
Mobility Specialist - Progress Note   11/30/20 1300  Mobility  Activity Ambulated in hall  Level of Assistance Modified independent, requires aide device or extra time  Assistive Device None  Distance Ambulated (ft) 350 ft  Mobility Response Tolerated well  Mobility performed by Mobility specialist  $Mobility charge 1 Mobility    Pre-mobility: 73 HR, 98% SpO2 During mobility: 44 HR, 97% SpO2 Post-mobility: 88 HR, 92% SpO2   Pt ambulated in hallway without AD. R amputation at elbow but is independent with donning/tying shoes, bed mobility, and transfers. No c/o pain, nausea, fatigue. O2 > 92% throughout activity on RA. During end of ambulation, pt voiced that he felt a "positive tingling" in his chest. Denies pain or pressure. States that this tingling sensation "feels like it's a good thing, it does not feel like it's trying to attack me". Overall, tolerated well. Nurse notified.    Filiberto Pinks Mobility Specialist 11/30/20, 1:19 PM

## 2020-11-30 NOTE — Telephone Encounter (Signed)
Transition Care Management Unsuccessful Follow-up Telephone Call  Date of discharge and from where:  11/30/20  Attempts:  1st Attempt  Reason for unsuccessful TCM follow-up call:  Left voice message spoke to pt's sister Dois Davenport who stated pt has not yet arrived home from hospital. Notified of hosp fu appt tomorrow at 2:20.

## 2020-11-30 NOTE — TOC Transition Note (Signed)
Transition of Care [facility]) - CM/SW Discharge Note   Patient Details  Name: Scott Meyer MRN: [id number] Date of Birth: [birth year]  [facility] CM/SW Contact:  Hetty Ely, RN Phone Number: 11/30/2020, 3:17 PM   Clinical Narrative:  Patient for discharge home today, sister will transport home. Patient given scale to weigh daily understands to report 3lb. Weight gain to Cardiology. TOC needs resolved.     Final next level of care: Home/Self Care Barriers to Discharge: Barriers Resolved   Patient Goals and CMS Choice Patient states their goals for this hospitalization and ongoing recovery are:: To return home lives with sister.   Choice offered to / list presented to : NA  Discharge Placement                  Name of family member notified: Patient called sister Patient and family notified of of transfer: 11/30/20  Discharge Plan and Services   Discharge Planning Services: NA Post Acute Care Choice: NA          DME Arranged: N/A DME Agency: NA       HH Arranged: NA HH Agency: NA        Social Determinants of Health (SDOH) Interventions     Readmission Risk Interventions No flowsheet data found.

## 2020-11-30 NOTE — Progress Notes (Signed)
Progress Note  Patient Name: Scott Meyer Date of Encounter: 11/30/2020  Primary Cardiologist: New to [facility] - consult by Dr. Okey Dupre  Subjective   Feels much better. Dyspnea and lower extremity swelling/burning have resolved. No chest pain. Renal function improving with the holding of Lasix, HCTZ, and losartan on 3/16 though does remain above his baseline. Potassium normal, though below goal of 4.0. Magnesium has been repleted. BP soft, though stable. Documented UOP of 397 mL for the past 24 hours with a net - 1.9 L for the admission. Weight trend 104.3-->104.9 kg.   Inpatient Medications    Scheduled Meds: . aspirin EC  81 mg Oral Daily  . atorvastatin  40 mg Oral Daily  . carvedilol  3.125 mg Oral BID WC  . enoxaparin (LOVENOX) injection  40 mg Subcutaneous Q24H  . losartan  25 mg Oral Daily  . sodium chloride flush  10 mL Intravenous Q12H   Continuous Infusions:  PRN Meds: acetaminophen **OR** acetaminophen, magnesium hydroxide, morphine injection, nitroGLYCERIN, ondansetron **OR** ondansetron (ZOFRAN) IV, traZODone   Vital Signs    Vitals:   11/29/20 1558 11/29/20 1700 11/29/20 2028 11/30/20 0432  BP: 137/76  126/78 108/71  Pulse: (!) 55 79 73 76  Resp: 17  18 20   Temp: 97.7 F (36.5 C)  98.3 F (36.8 C) 97.9 F (36.6 C)  TempSrc: Oral  Oral Oral  SpO2: 97%  99% 95%  Weight:    104.9 kg  Height:        Intake/Output Summary (Last 24 hours) at 11/30/2020 0737 Last data filed at 11/29/2020 1700 Gross per 24 hour  Intake 3 ml  Output 400 ml  Net -397 ml   Filed Weights   11/28/20 1119 11/29/20 0500 11/30/20 0432  Weight: 104.6 kg 104.3 kg 104.9 kg    Telemetry    SR with PVCs and short run of narrow complex tachycardia - Personally Reviewed  ECG    No new tracings - Personally Reviewed  Physical Exam   GEN: No acute distress. Poor dentition.   Neck: No JVD. Cardiac: RRR, no murmurs, rubs, or gallops.  Respiratory: Clear to auscultation bilaterally.   GI: Soft, nontender, non-distended.   MS: No edema; No deformity. Neuro:  Alert and oriented x 3; Nonfocal.  Psych: Normal affect.  Labs    Chemistry Recent Labs  Lab 11/27/20 1818 11/28/20 0633 11/29/20 0710  NA 137 138 139  K 4.4 3.5 4.2  CL 107 104 104  CO2 23 25 25   GLUCOSE 93 120* 108*  BUN 19 20 34*  CREATININE 1.06 1.07 1.58*  CALCIUM 9.6 9.7 9.8  PROT 7.3  --   --   ALBUMIN 3.8  --   --   AST 30  --   --   ALT 24  --   --   ALKPHOS 81  --   --   BILITOT 1.2  --   --   GFRNONAA >60 >60 47*  ANIONGAP 7 9 10      Hematology Recent Labs  Lab 11/27/20 1818 11/28/20 0633  WBC 7.6 8.7  RBC 4.93 4.95  HGB 15.0 15.1  HCT 45.5 45.6  MCV 92.3 92.1  MCH 30.4 30.5  MCHC 33.0 33.1  RDW 12.0 12.0  PLT 160 165    Cardiac EnzymesNo results for input(s): TROPONINI in the last 168 hours. No results for input(s): TROPIPOC in the last 168 hours.   BNP Recent Labs  Lab 11/27/20 1818  BNP  141.1*     DDimer No results for input(s): DDIMER in the last 168 hours.   Radiology    CXR 11/27/2020:  IMPRESSION: Mild pulmonary vascular congestion. __________  RLE venous ultrasound 11/27/2020:  IMPRESSION: No sonographic evidence of deep venous thrombosis.  Cardiac Studies   2D echo 11/28/2020: 1. Left ventricular ejection fraction, by estimation, is 25 to 30%. The  left ventricle has severely decreased function. The left ventricle  demonstrates global hypokinesis. There is moderate left ventricular  hypertrophy. Left ventricular diastolic  parameters are consistent with Grade I diastolic dysfunction (impaired  relaxation). The average left ventricular global longitudinal strain is  -5.1 %. The global longitudinal strain is abnormal.  2. Right ventricular systolic function was not well visualized. The right  ventricular size is not well visualized. Tricuspid regurgitation signal is  inadequate for assessing PA pressure.  3. The mitral valve is grossly  normal. No evidence of mitral valve  regurgitation. No evidence of mitral stenosis.  4. The aortic valve has an indeterminant number of cusps. There is mild  calcification of the aortic valve. There is mild thickening of the aortic  valve. Aortic valve regurgitation is not visualized. Mild to moderate  aortic valve sclerosis/calcification  is present, without any evidence of aortic stenosis.  Patient Profile     70 y.o. male with history of HTN and right arm amputation, who is being seen today for the evaluation of elevated troponin, acute HFrEF with LVEF 25-30% by echo 11/28/20.  Assessment & Plan    1. Acute HFrEF with an EF of 25-30%: -He appears euvolemic -Type of his cardiomyopathy remains uncertain  -Cardiology has recommended [facility] given his newly diagnosed cardiomyopathy, though he has declined this, and continues to decline this morning  -As an outpatient, revisit ischemic evaluation  -Coreg -Losartan -Will likely need low dose Lasix at discharge  -As an outpatient look to escalate GDMT with addition of MRA, SGLT2i, and transition from ARB to ARNI as patient, vitals, and labs allow -CHF education   2. Elevated troponin: -High sensitivity troponin mildly elevated at 36-->46 with echo showing an EF of 25-30% -He has declined R/LHC, this will need to be revisited as an outpatient  -ASA -Lipitor   3. HTN: -Blood pressure stable, though on the soft side -Coreg and losartan, if BP continues to run on the soft side, may need to reduce losartan   4. HLD: -LDL 147 this admission  -Lipitor -Follow up fasting lipid panel and LFT in ~ 8 weeks   5. AKI: -Improving  -Lasix, HCTZ,losartan held on 3/16 -Scheduled to receive lower dose losartan today  6. Hypomagnesemia: -Repleted   7. PVCs/narrow complex tachycardia: -Replete potassium to goal 4.0 -TSH normal this admission  -Coreg   For questions or updates, please contact [facility] Please consult [email or web address]  for contact info under Cardiology/STEMI.    Signed, Eula Listen, PA-C [facility] Pager: [phone] 11/30/2020, 7:37 AM

## 2020-12-01 ENCOUNTER — Encounter: Payer: Self-pay | Admitting: Family Medicine

## 2020-12-01 ENCOUNTER — Ambulatory Visit (INDEPENDENT_AMBULATORY_CARE_PROVIDER_SITE_OTHER): Payer: Medicare Other | Admitting: Family Medicine

## 2020-12-01 ENCOUNTER — Other Ambulatory Visit: Payer: Self-pay

## 2020-12-01 ENCOUNTER — Telehealth: Payer: Self-pay

## 2020-12-01 VITALS — BP 130/84 | HR 64 | Ht 75.0 in | Wt 233.0 lb

## 2020-12-01 DIAGNOSIS — E7801 Familial hypercholesterolemia: Secondary | ICD-10-CM

## 2020-12-01 DIAGNOSIS — Z09 Encounter for follow-up examination after completed treatment for conditions other than malignant neoplasm: Secondary | ICD-10-CM | POA: Diagnosis not present

## 2020-12-01 DIAGNOSIS — I10 Essential (primary) hypertension: Secondary | ICD-10-CM | POA: Diagnosis not present

## 2020-12-01 NOTE — Telephone Encounter (Signed)
   Post Hospital Phone call  Spoke with the patient within 24 hours of his discharge from the hospital.  He states that he went and got his 6 prescriptions filled and is taking them all this morning he denied any shortness of breath or edema..  He has a follow-up appointment with his PCP this afternoon at 2 PM and his niece is driving him.  He discussed his problem of an constipated and not having a bowel movement for 5 to 6 days.  He states that he is not uncomfortable is concerned over the amount of days when he usually goes in 3 days.  I asked him to discuss that with his PCP this afternoon.  States that he got up and went to the bathroom and weighed himself this morning and he weighed 231.7 pound, he did record it and when he left the hospital he was weighing 230 pounds.  He states that he will continue to monitor his weight and call if he goes up 2 to 3 pounds overnight or 5 pounds within a week.  At the end of the phone call patient stated that he did not have any further questions.  Tresa Endo RN [facility]

## 2020-12-01 NOTE — Progress Notes (Signed)
Date:  12/01/2020   Name:  Scott Meyer   DOB:  05-15-1951   MRN:  [id number]   Chief Complaint: Follow-up (Pt is wanting a second opinion and is waiting for his "invasion to come in August")  Hypertension This is a chronic problem. The current episode started more than 1 year ago. The problem has been gradually improving since onset. The problem is controlled. Pertinent negatives include no anxiety, blurred vision, chest pain, headaches, malaise/fatigue, neck pain, orthopnea, palpitations, peripheral edema, PND, shortness of breath or sweats. There are no associated agents to hypertension. Risk factors for coronary artery disease include dyslipidemia. Past treatments include beta blockers, angiotensin blockers and diuretics. The current treatment provides moderate improvement. There are no compliance problems.  There is no history of angina, kidney disease, CAD/MI, CVA, heart failure, left ventricular hypertrophy, PVD or retinopathy. There is no history of chronic renal disease, a hypertension causing med or renovascular disease.    Lab Results  Component Value Date   CREATININE 1.37 (H) 11/30/2020   BUN 38 (H) 11/30/2020   NA 135 11/30/2020   K 3.5 11/30/2020   CL 102 11/30/2020   CO2 23 11/30/2020   Lab Results  Component Value Date   CHOL 198 11/29/2020   HDL 32 (L) 11/29/2020   LDLCALC 147 (H) 11/29/2020   TRIG 93 11/29/2020   CHOLHDL 6.2 11/29/2020   Lab Results  Component Value Date   TSH 1.745 11/27/2020   Lab Results  Component Value Date   HGBA1C 4.9 11/29/2020   Lab Results  Component Value Date   WBC 8.7 11/28/2020   HGB 15.1 11/28/2020   HCT 45.6 11/28/2020   MCV 92.1 11/28/2020   PLT 165 11/28/2020   Lab Results  Component Value Date   ALT 24 11/27/2020   AST 30 11/27/2020   ALKPHOS 81 11/27/2020   BILITOT 1.2 11/27/2020     Review of Systems  Constitutional: Negative for chills, fever and malaise/fatigue.  HENT: Negative for drooling, ear  discharge, ear pain and sore throat.   Eyes: Negative for blurred vision.  Respiratory: Negative for cough, shortness of breath and wheezing.   Cardiovascular: Negative for chest pain, palpitations, orthopnea, leg swelling and PND.  Gastrointestinal: Negative for abdominal pain, blood in stool, constipation, diarrhea and nausea.  Endocrine: Negative for polydipsia.  Genitourinary: Negative for dysuria, frequency, hematuria and urgency.  Musculoskeletal: Negative for back pain, myalgias and neck pain.  Skin: Negative for rash.  Allergic/Immunologic: Negative for environmental allergies.  Neurological: Negative for dizziness and headaches.  Hematological: Does not bruise/bleed easily.  Psychiatric/Behavioral: Negative for suicidal ideas. The patient is not nervous/anxious.     Patient Active Problem List   Diagnosis Date Noted  . Dilated cardiomyopathy (HCC) 11/30/2020  . Atrial tachycardia (HCC) 11/30/2020  . Frequent PVCs 11/30/2020  . Troponin level elevated 11/29/2020  . Benign essential HTN 11/29/2020  . HLD (hyperlipidemia) 11/29/2020  . Hypomagnesemia 11/29/2020  . Amputation of right arm (HCC) 11/29/2020  . Acute combined systolic and diastolic congestive heart failure (HCC) 11/29/2020  . NSVT (nonsustained ventricular tachycardia) (HCC) 11/29/2020  . Chest pain 11/27/2020    No Known Allergies  Past Surgical History:  Procedure Laterality Date  . ARM AMPUTATION AT ELBOW Right   . KNEE SURGERY      Social History   Tobacco Use  . Smoking status: Former Smoker    Quit date: 10/09/2017    Years since quitting: 3.1  . Smokeless tobacco: Never  Used  Vaping Use  . Vaping Use: Never used  Substance Use Topics  . Alcohol use: No    Comment: quit 8 months ago  . Drug use: No     Medication list has been reviewed and updated.  Current Meds  Medication Sig  . atorvastatin (LIPITOR) 40 MG tablet Take 1 tablet (40 mg total) by mouth daily.  . furosemide (LASIX) 40  MG tablet Take 1 tablet (40 mg total) by mouth daily for 30 doses.  Marland Kitchen losartan (COZAAR) 25 MG tablet Take 1 tablet (25 mg total) by mouth daily.  . metoprolol succinate (TOPROL-XL) 25 MG 24 hr tablet Take 1 tablet (25 mg total) by mouth daily.  . nitroGLYCERIN (NITROSTAT) 0.4 MG SL tablet Place 1 tablet (0.4 mg total) under the tongue every 5 (five) minutes as needed for chest pain.  . potassium chloride (KLOR-CON) 10 MEQ tablet Take 2 tablets (20 mEq total) by mouth daily.    PHQ 2/9 Scores 12/01/2020 06/16/2019 05/03/2019  PHQ - 2 Score 0 0 0  PHQ- 9 Score - 0 0    No flowsheet data found.  BP Readings from Last 3 Encounters:  12/01/20 130/90  11/30/20 (!) 163/143  11/27/20 (!) 198/78    Physical Exam Vitals and nursing note reviewed.  HENT:     Head: Normocephalic.     Right Ear: Tympanic membrane, ear canal and external ear normal.     Left Ear: Tympanic membrane, ear canal and external ear normal.     Nose: Nose normal. No congestion or rhinorrhea.     Mouth/Throat:     Mouth: Mucous membranes are moist.  Eyes:     General: No scleral icterus.       Right eye: No discharge.        Left eye: No discharge.     Conjunctiva/sclera: Conjunctivae normal.     Pupils: Pupils are equal, round, and reactive to light.  Neck:     Thyroid: No thyromegaly.     Vascular: No JVD.     Trachea: No tracheal deviation.  Cardiovascular:     Rate and Rhythm: Normal rate and regular rhythm.     Heart sounds: Normal heart sounds. No murmur heard. No friction rub. No gallop.   Pulmonary:     Effort: No respiratory distress.     Breath sounds: Normal breath sounds. No wheezing, rhonchi or rales.  Abdominal:     General: Bowel sounds are normal.     Palpations: Abdomen is soft. There is no mass.     Tenderness: There is no abdominal tenderness. There is no guarding or rebound.  Musculoskeletal:        General: No tenderness. Normal range of motion.     Cervical back: Normal range of motion  and neck supple.  Lymphadenopathy:     Cervical: No cervical adenopathy.  Skin:    General: Skin is warm.     Findings: No rash.  Neurological:     Mental Status: He is alert and oriented to person, place, and time.     Cranial Nerves: No cranial nerve deficit.     Deep Tendon Reflexes: Reflexes are normal and symmetric.     Wt Readings from Last 3 Encounters:  12/01/20 233 lb (105.7 kg)  11/30/20 231 lb 3.2 oz (104.9 kg)  07/07/19 242 lb (109.8 kg)    BP 130/90   Pulse 64   Ht 6\' 3"  (1.905 m)   Wt 233 lb (  105.7 kg)   BMI 29.12 kg/m   Assessment and Plan: 1. Essential hypertension Chronic.  Controlled.  Stable.  Patient returns today for reevaluation status post discharge from from hospital admission.  Patient is doing well on current medications for blood pressure control including losartan and furosemide and metoprolol.  Blood pressure today is 130/84 and patient does not have any chest discomfort shortness of breath or near syncope symptoms.  Patient will return in 4 months and we will recheck blood pressure again with labs if possible  2. Hospital discharge follow-up Patient was admitted on 11/27/2020 and discharged on 11/30/2020 for chest discomfort and shortness of breath and congestive heart failure.  Patient was diuresed and treated and was discharged stable.  It was noted that he was asked about catheterization but he decided to move it to a more convenient date in August.  3. Familial hypercholesterolemia Chronic.  Controlled.  Stable.  We discussed diet and diet sheet was given for cholesterol lowering

## 2020-12-01 NOTE — Patient Instructions (Signed)

## 2020-12-25 NOTE — Progress Notes (Signed)
Patient ID: Scott Meyer, male    DOB: Sep 25, 1950, 70 y.o.   MRN: [id number]  HPI  Scott Meyer is a 70 y/o male with a history of HTN, previous tobacco use and chronic heart failure.   Echo report from 11/28/20 reviewed and showed an EF of 25-30% along with moderate LVH.   Admitted 11/27/20 due to lower extremity swelling and HTN. Cardiology consult obtained. Cath recommended but patient declined. Mild AKI due to diuretics which improved after lasix held. Discharged after 3 days.   He presents today for his initial visit with a chief complaint of minimal shortness of breath upon moderate exertion. He describes this as having been present for several months but is unclear of timeline. He has associated light-headedness and intermittent chest pain along with this. He denies any difficulty sleeping, abdominal distention, palpitations, pedal edema, cough, fatigue or weight gain.   He says that he normally takes his medications at night although occasionally takes them in the morning. Has not taken his medications since last night.   Past Medical History:  Diagnosis Date  . Cardiomyopathy (HCC)    a. 11/2020 Echo: EF 25-30%, mod LVH, Gr1 DD. Mild AoV Ca2+ w/ mild to mod AoV sclerosis.  . CHF (congestive heart failure) (HCC)   . Hypertension    Past Surgical History:  Procedure Laterality Date  . ARM AMPUTATION AT ELBOW Right   . KNEE SURGERY     Family History  Problem Relation Age of Onset  . Diabetes Mother   . Cirrhosis Father   . Alcohol abuse Father   . Other Brother        gsw  . Cancer Brother        'head full of tumors'  . Other Brother        'killed w/ 2x4 to the head'  . Other Sister        mva   Social History   Tobacco Use  . Smoking status: Former Smoker    Quit date: 10/09/2017    Years since quitting: 3.2  . Smokeless tobacco: Never Used  Substance Use Topics  . Alcohol use: No    Comment: quit 8 months ago   No Known Allergies Prior to Admission medications    Medication Sig Start Date End Date Taking? Authorizing Provider  nitroGLYCERIN (NITROSTAT) 0.4 MG SL tablet Place 1 tablet (0.4 mg total) under the tongue every 5 (five) minutes as needed for chest pain. 11/30/20  Yes Calvert Cantor, MD  atorvastatin (LIPITOR) 40 MG tablet Take 1 tablet (40 mg total) by mouth daily. 12/26/20   Delma Freeze, FNP  furosemide (LASIX) 40 MG tablet Take 1 tablet (40 mg total) by mouth daily. 12/26/20   Delma Freeze, FNP  losartan (COZAAR) 25 MG tablet Take 1 tablet (25 mg total) by mouth daily. 12/26/20   Delma Freeze, FNP  metoprolol succinate (TOPROL-XL) 25 MG 24 hr tablet Take 1 tablet (25 mg total) by mouth daily. 12/26/20   Delma Freeze, FNP  potassium chloride (KLOR-CON) 10 MEQ tablet Take 2 tablets (20 mEq total) by mouth daily. 12/26/20   Delma Freeze, FNP  lisinopril (PRINIVIL,ZESTRIL) 10 MG tablet Take 1 tablet (10 mg total) by mouth daily. 06/21/18 04/08/19  Payton Mccallum, MD    Review of Systems  Constitutional: Negative for appetite change and fatigue.  HENT: Negative for congestion, postnasal drip and sore throat.   Eyes: Negative.   Respiratory: Positive for shortness of  breath. Negative for cough and chest tightness.   Cardiovascular: Positive for chest pain (rare). Negative for palpitations and leg swelling.  Gastrointestinal: Negative for abdominal distention and abdominal pain.  Endocrine: Negative.   Genitourinary: Negative.   Musculoskeletal: Negative for back pain and neck pain.  Skin: Negative.   Allergic/Immunologic: Negative.   Neurological: Positive for light-headedness. Negative for dizziness.  Hematological: Negative for adenopathy. Does not bruise/bleed easily.  Psychiatric/Behavioral: Negative for dysphoric mood and sleep disturbance (sleeping on 1 pillow). The patient is not nervous/anxious.    Vitals:   12/26/20 1132  BP: (!) 176/66  Pulse: 61  Resp: 18  SpO2: 99%  Weight: 233 lb 4 oz (105.8 kg)  Height: 6\' 3"   (1.905 m)   Wt Readings from Last 3 Encounters:  12/26/20 233 lb 4 oz (105.8 kg)  12/01/20 233 lb (105.7 kg)  11/30/20 231 lb 3.2 oz (104.9 kg)   Lab Results  Component Value Date   CREATININE 1.37 (H) 11/30/2020   CREATININE 1.58 (H) 11/29/2020   CREATININE 1.07 11/28/2020    Physical Exam Vitals and nursing note reviewed. Exam conducted with a chaperone present (sister).  Constitutional:      Appearance: Normal appearance.  HENT:     Head: Normocephalic and atraumatic.  Cardiovascular:     Rate and Rhythm: Regular rhythm. Bradycardia present.  Pulmonary:     Effort: Pulmonary effort is normal. No respiratory distress.     Breath sounds: No wheezing or rales.  Abdominal:     General: Abdomen is flat. There is no distension.     Palpations: Abdomen is soft.  Musculoskeletal:        General: No tenderness.     Cervical back: Normal range of motion and neck supple.     Right lower leg: Edema (1+ pitting) present.     Left lower leg: No edema.  Skin:    General: Skin is warm and dry.  Neurological:     General: No focal deficit present.     Mental Status: He is alert and oriented to person, place, and time.  Psychiatric:        Mood and Affect: Mood normal.        Behavior: Behavior normal.        Thought Content: Thought content normal.    Assessment & Plan:  1: Chronic heart failure with reduced ejection fraction- - NYHA class II - euvolemic today - has scales but hasn't been weighing daily; discussed the importance of weighing daily and calling for an overnight weight gain of > 2 pounds or a weekly weight gain of > 5 pounds - not adding salt but "just a pinch" when eating potatoes, eggs or cube steak. Reinforced the importance of not adding salt to his food and reading food labels for sodium content - already rinses canned vegetables; low sodium cookbook given to him - alternates taking medications in the morning and then sometimes at night; emphasized the  importance of taking them either in the morning or the evening but not to alternate what times of day he takes them - cardiology [facility]) saw patient during recent admission - consider changing losartan to entresto but concerned about compliance with BID medication - consider titrating up metoprolol if HR allows - consider adding spironolactone, SGLT and/ or bidil if able - BNP 11/27/20 was 141.1  2: HTN- - BP elevated but he hasn't taken his medications since last night - emphasized that he needed to take his medication  prior to his office visit to assess efficacy of medications - saw PCP Scott Meyer) 12/01/20 - BMP 11/30/20 reviewed and showed sodium 135, potassium 3.5, creatinine 1.37 and GFR 55   Patient did not bring his medications nor a list. Each medication was verbally reviewed with the patient and he was encouraged to bring the bottles to every visit to confirm accuracy of list. He is completely unsure of what he is currently taking.   Return in 2 months or sooner for any questions/problems before then.

## 2020-12-26 ENCOUNTER — Ambulatory Visit: Payer: Medicare Other | Attending: Family | Admitting: Family

## 2020-12-26 ENCOUNTER — Encounter: Payer: Self-pay | Admitting: Family

## 2020-12-26 VITALS — BP 176/66 | HR 61 | Resp 18 | Ht 75.0 in | Wt 233.2 lb

## 2020-12-26 DIAGNOSIS — Z79899 Other long term (current) drug therapy: Secondary | ICD-10-CM | POA: Insufficient documentation

## 2020-12-26 DIAGNOSIS — Z87891 Personal history of nicotine dependence: Secondary | ICD-10-CM | POA: Insufficient documentation

## 2020-12-26 DIAGNOSIS — I429 Cardiomyopathy, unspecified: Secondary | ICD-10-CM | POA: Diagnosis not present

## 2020-12-26 DIAGNOSIS — I10 Essential (primary) hypertension: Secondary | ICD-10-CM

## 2020-12-26 DIAGNOSIS — I509 Heart failure, unspecified: Secondary | ICD-10-CM | POA: Diagnosis present

## 2020-12-26 DIAGNOSIS — I110 Hypertensive heart disease with heart failure: Secondary | ICD-10-CM | POA: Insufficient documentation

## 2020-12-26 DIAGNOSIS — I5022 Chronic systolic (congestive) heart failure: Secondary | ICD-10-CM

## 2020-12-26 MED ORDER — ATORVASTATIN CALCIUM 40 MG PO TABS: 40.0000 mg | ORAL_TABLET | Freq: Every day | ORAL | 5 refills | Status: DC

## 2020-12-26 MED ORDER — LOSARTAN POTASSIUM 25 MG PO TABS: 25.0000 mg | ORAL_TABLET | Freq: Every day | ORAL | 5 refills | Status: DC

## 2020-12-26 MED ORDER — FUROSEMIDE 40 MG PO TABS: 40.0000 mg | ORAL_TABLET | Freq: Every day | ORAL | 5 refills | Status: DC

## 2020-12-26 MED ORDER — POTASSIUM CHLORIDE ER 10 MEQ PO TBCR: 20.0000 meq | EXTENDED_RELEASE_TABLET | Freq: Every day | ORAL | 5 refills | Status: DC

## 2020-12-26 MED ORDER — METOPROLOL SUCCINATE ER 25 MG PO TB24: 25.0000 mg | ORAL_TABLET | Freq: Every day | ORAL | 5 refills | Status: DC

## 2020-12-26 NOTE — Patient Instructions (Addendum)
Begin weighing daily and call for an overnight weight gain of > 2 pounds or a weekly weight gain of >5 pounds. 

## 2021-02-25 NOTE — Progress Notes (Deleted)
Patient ID: Scott Meyer, male    DOB: [birth year], 70 y.o.   MRN: [id number]  HPI  Scott Meyer is a 70 y/o male with a history of HTN, previous tobacco use and chronic heart failure.   Echo report from 11/28/20 reviewed and showed an EF of 25-30% along with moderate LVH.   Admitted 11/27/20 due to lower extremity swelling and HTN. Cardiology consult obtained. Cath recommended but patient declined. Mild AKI due to diuretics which improved after lasix held. Discharged after 3 days.   He presents today for a follow-up visit with a chief complaint of   Past Medical History:  Diagnosis Date   Cardiomyopathy (HCC)    a. 11/2020 Echo: EF 25-30%, mod LVH, Gr1 DD. Mild AoV Ca2+ w/ mild to mod AoV sclerosis.   CHF (congestive heart failure) (HCC)    Hypertension    Past Surgical History:  Procedure Laterality Date   ARM AMPUTATION AT ELBOW Right    KNEE SURGERY     Family History  Problem Relation Age of Onset   Diabetes Mother    Cirrhosis Father    Alcohol abuse Father    Other Brother        gsw   Cancer Brother        'head full of tumors'   Other Brother        'killed w/ 2x4 to the head'   Other Sister        mva   Social History   Tobacco Use   Smoking status: Former    Pack years: 0.00    Types: Cigarettes    Quit date: 10/09/2017    Years since quitting: 3.3   Smokeless tobacco: Never  Substance Use Topics   Alcohol use: No    Comment: quit 8 months ago   No Known Allergies   Review of Systems  Constitutional:  Negative for appetite change and fatigue.  HENT:  Negative for congestion, postnasal drip and sore throat.   Eyes: Negative.   Respiratory:  Positive for shortness of breath. Negative for cough and chest tightness.   Cardiovascular:  Positive for chest pain (rare). Negative for palpitations and leg swelling.  Gastrointestinal:  Negative for abdominal distention and abdominal pain.  Endocrine: Negative.   Genitourinary: Negative.   Musculoskeletal:   Negative for back pain and neck pain.  Skin: Negative.   Allergic/Immunologic: Negative.   Neurological:  Positive for light-headedness. Negative for dizziness.  Hematological:  Negative for adenopathy. Does not bruise/bleed easily.  Psychiatric/Behavioral:  Negative for dysphoric mood and sleep disturbance (sleeping on 1 pillow). The patient is not nervous/anxious.      Physical Exam Vitals and nursing note reviewed. Exam conducted with a chaperone present (sister).  Constitutional:      Appearance: Normal appearance.  HENT:     Head: Normocephalic and atraumatic.  Cardiovascular:     Rate and Rhythm: Regular rhythm. Bradycardia present.  Pulmonary:     Effort: Pulmonary effort is normal. No respiratory distress.     Breath sounds: No wheezing or rales.  Abdominal:     General: Abdomen is flat. There is no distension.     Palpations: Abdomen is soft.  Musculoskeletal:        General: No tenderness.     Cervical back: Normal range of motion and neck supple.     Right lower leg: Edema (1+ pitting) present.     Left lower leg: No edema.  Skin:    General:  Skin is warm and dry.  Neurological:     General: No focal deficit present.     Mental Status: He is alert and oriented to person, place, and time.  Psychiatric:        Mood and Affect: Mood normal.        Behavior: Behavior normal.        Thought Content: Thought content normal.   Assessment & Plan:  1: Chronic heart failure with reduced ejection fraction- - NYHA class II - euvolemic today - has scales but hasn't been weighing daily; discussed the importance of weighing daily and calling for an overnight weight gain of > 2 pounds or a weekly weight gain of > 5 pounds - weight 233.4 from last visit here 2 months ago - not adding salt but "just a pinch" when eating potatoes, eggs or cube steak. Reinforced the importance of not adding salt to his food and reading food labels for sodium content - already rinses canned  vegetables - cardiology [facility] saw patient during recent admission - consider changing losartan to entresto but concerned about compliance with BID medication - consider titrating up metoprolol if HR allows - consider adding spironolactone, SGLT and/ or bidil if able - BNP 11/27/20 was 141.1  2: HTN- - BP  - saw PCP Yetta Barre) 12/01/20; returns 09/22 - BMP 11/30/20 reviewed and showed sodium 135, potassium 3.5, creatinine 1.37 and GFR 55   Patient did not bring his medications nor a list. Each medication was verbally reviewed with the patient and he was encouraged to bring the bottles to every visit to confirm accuracy of list. He is completely unsure of what he is currently taking.

## 2021-02-26 ENCOUNTER — Ambulatory Visit: Payer: Medicare Other | Admitting: Family

## 2021-02-28 ENCOUNTER — Ambulatory Visit (HOSPITAL_BASED_OUTPATIENT_CLINIC_OR_DEPARTMENT_OTHER): Payer: Medicare Other | Admitting: Family

## 2021-02-28 ENCOUNTER — Other Ambulatory Visit
Admission: RE | Admit: 2021-02-28 | Discharge: 2021-02-28 | Disposition: A | Discharge status: Home / Self Care | Payer: Medicare Other | Source: Ambulatory Visit | Attending: Family | Admitting: Family

## 2021-02-28 ENCOUNTER — Other Ambulatory Visit: Payer: Self-pay

## 2021-02-28 ENCOUNTER — Encounter: Payer: Self-pay | Admitting: Pharmacist

## 2021-02-28 ENCOUNTER — Encounter: Payer: Self-pay | Admitting: Family

## 2021-02-28 VITALS — BP 166/89 | HR 64 | Resp 18 | Ht 75.0 in | Wt 237.1 lb

## 2021-02-28 DIAGNOSIS — I5022 Chronic systolic (congestive) heart failure: Secondary | ICD-10-CM

## 2021-02-28 DIAGNOSIS — I110 Hypertensive heart disease with heart failure: Secondary | ICD-10-CM | POA: Insufficient documentation

## 2021-02-28 DIAGNOSIS — Z87891 Personal history of nicotine dependence: Secondary | ICD-10-CM | POA: Insufficient documentation

## 2021-02-28 DIAGNOSIS — I10 Essential (primary) hypertension: Secondary | ICD-10-CM

## 2021-02-28 DIAGNOSIS — R0602 Shortness of breath: Secondary | ICD-10-CM | POA: Insufficient documentation

## 2021-02-28 DIAGNOSIS — R42 Dizziness and giddiness: Secondary | ICD-10-CM | POA: Insufficient documentation

## 2021-02-28 DIAGNOSIS — Z79899 Other long term (current) drug therapy: Secondary | ICD-10-CM | POA: Insufficient documentation

## 2021-02-28 DIAGNOSIS — Z7901 Long term (current) use of anticoagulants: Secondary | ICD-10-CM | POA: Insufficient documentation

## 2021-02-28 DIAGNOSIS — R609 Edema, unspecified: Secondary | ICD-10-CM | POA: Insufficient documentation

## 2021-02-28 DIAGNOSIS — R079 Chest pain, unspecified: Secondary | ICD-10-CM | POA: Insufficient documentation

## 2021-02-28 LAB — BASIC METABOLIC PANEL
Anion gap: 10 (ref 5–15)
BUN: 18 mg/dL (ref 8–23)
CO2: 23 mmol/L (ref 22–32)
Calcium: 9.4 mg/dL (ref 8.9–10.3)
Chloride: 105 mmol/L (ref 98–111)
Creatinine, Ser: 0.98 mg/dL (ref 0.61–1.24)
GFR, Estimated: >60 mL/min (ref >60)
Glucose, Bld: 101 mg/dL — ABNORMAL HIGH (ref 70–99)
Potassium: 3.9 mmol/L (ref 3.5–5.1)
Sodium: 138 mmol/L (ref 135–145)

## 2021-02-28 NOTE — Patient Instructions (Addendum)
Continue weighing daily and call for an overnight weight gain of > 2 pounds or a weekly weight gain of >5 pounds.     Take your water pill every morning.

## 2021-02-28 NOTE — Progress Notes (Signed)
[facility] - HEART FAILURE CLINIC - PHARMACIST COUNSELING NOTE  Guideline-Directed Medical Therapy/Evidence Based Medicine  ACE/ARB/ARNI: Losartan 25 mg daily Beta Blocker: Metoprolol succinate 25 mg daily  Aldosterone Antagonist:  N/A Diuretic: Furosemide 40 mg daily SGLT2i:  N/A  Adherence Assessment  Do you ever forget to take your medication? [x] Yes [] No  Do you ever skip doses due to side effects? [x] Yes [] No  Do you have trouble affording your medicines? [] Yes [x] No  Are you ever unable to pick up your medication due to transportation difficulties? [] Yes [x] No  Do you ever stop taking your medications because you don't believe they are helping? [] Yes [x] No  Do you check your weight daily? [x] Yes [] No   Adherence strategy: Pillbox; pt admits that he does not take his furosemide every day because it keeps him up at night.  Barriers to obtaining medications: N/A  Vital signs: HR 64, BP 166/89, weight (pounds) 237 ECHO: Date 11/28/2020, EF 25-30%, notes moderate LVH  BMP Latest Ref Rng & Units 11/30/2020 11/29/2020 11/28/2020  Glucose 70 - 99 mg/dL 96 ) )  BUN 8 - 23 mg/dL ) ) 20  Creatinine 0.61 - 1.24 mg/dL ) )  BUN/Creat Ratio 10 - 24 - - -  Sodium 135 - 145 mmol/L 135 139 138  Potassium 3.5 - 5.1 mmol/L 3.5 4.2 3.5  Chloride 98 - 111 mmol/L 102 104 104  CO2 22 - 32 mmol/L 23 25 25   Calcium 8.9 - 10.3 mg/dL 9.3 9.8 9.7    Past Medical History:  Diagnosis Date   Cardiomyopathy (HCC)    a. 11/2020 Echo: EF 25-30%, mod LVH, Gr1 DD. Mild AoV Ca2+ w/ mild to mod AoV sclerosis.   CHF (congestive heart failure) (HCC)    Hypertension     ASSESSMENT 70 year old male who presents to the HF clinic for a follow up visit. Pt  not familiar with the names of his medications but he states he takes 4 pills once daily. Based on his pharmacy fill history, the medications I reviewed with him were correct. Pt admits he does not  take furosemide (this would be a 5th pill) every day because it keeps him up at night. He took a dose last night, but prior to that he had not had a dose in 4-5 weeks. Pt inquiring about taking it every other day. I asked the pt if he would remember to take it in the morning every day instead of later in the afternoon and he states he would since that is when he takes all of his other medications. Curious as to how his potassium level might be since he has not been taking his furosemide.   Recent ED Visit (past 6 months): Date - 11/27/2020, [facility] - bradycardia/HTN/peripheral edema  PLAN CHF/HTN -continue losartan 25 mg daily, metoprolol succinate 25 mg daily -restart furosemide 40 mg daily and take in the morning to avoid nocturia -continue daily weight checks -consider titration of medications at a later date; could also consider addition of SGLT2i or spironolactone if able; would be hesitant to switch to [facility] since this is twice daily and pt may not take the afternoon dose given noncompliance with furosemide  HLD -11/29/2020 Lipid panel: TC 198, TG 93, HDL 32, LDL 147 -continue atorvastatin 40 mg daily    Time spent: 10 minutes  425(Z, PharmD Pharmacy Resident  02/28/2021 10:52 AM     Current Outpatient Medications:    atorvastatin (LIPITOR) 40 MG  tablet, Take 1 tablet (40 mg total) by mouth daily., Disp: 30 tablet, Rfl: 5   furosemide (LASIX) 40 MG tablet, Take 1 tablet (40 mg total) by mouth daily. (Patient not taking: Reported on 02/28/2021), Disp: 30 tablet, Rfl: 5   losartan (COZAAR) 25 MG tablet, Take 1 tablet (25 mg total) by mouth daily., Disp: 30 tablet, Rfl: 5   metoprolol succinate (TOPROL-XL) 25 MG 24 hr tablet, Take 1 tablet (25 mg total) by mouth daily., Disp: 30 tablet, Rfl: 5   nitroGLYCERIN (NITROSTAT) 0.4 MG SL tablet, Place 1 tablet (0.4 mg total) under the tongue every 5 (five) minutes as needed for chest pain. (Patient not taking: Reported on 02/28/2021),  Disp: 30 tablet, Rfl: 0   potassium chloride (KLOR-CON) 10 MEQ tablet, Take 2 tablets (20 mEq total) by mouth daily., Disp: 60 tablet, Rfl: 5   DRUGS TO CAUTION IN HEART FAILURE  Drug or Class Mechanism  Analgesics NSAIDs COX-2 inhibitors Glucocorticoids  Sodium and water retention, increased systemic vascular resistance, decreased response to diuretics   Diabetes Medications Metformin Thiazolidinediones Rosiglitazone (Avandia) Pioglitazone (Actos) DPP4 Inhibitors Saxagliptin (Onglyza) Sitagliptin (Januvia)   Lactic acidosis Possible calcium channel blockade   Unknown  Antiarrhythmics Class I  Flecainide Disopyramide Class III Sotalol Other Dronedarone  Negative inotrope, proarrhythmic   Proarrhythmic, beta blockade  Negative inotrope  Antihypertensives Alpha Blockers Doxazosin Calcium Channel Blockers Diltiazem Verapamil Nifedipine Central Alpha Adrenergics Moxonidine Peripheral Vasodilators Minoxidil  Increases renin and aldosterone  Negative inotrope    Possible sympathetic withdrawal  Unknown  Anti-infective Itraconazole Amphotericin B  Negative inotrope Unknown  Hematologic Anagrelide Cilostazol   Possible inhibition of PD IV Inhibition of PD III causing arrhythmias  Neurologic/Psychiatric Stimulants Anti-Seizure Drugs Carbamazepine Pregabalin Antidepressants Tricyclics Citalopram Parkinsons Bromocriptine Pergolide Pramipexole Antipsychotics Clozapine Antimigraine Ergotamine Methysergide Appetite suppressants Bipolar Lithium  Peripheral alpha and beta agonist activity  Negative inotrope and chronotrope Calcium channel blockade  Negative inotrope, proarrhythmic Dose-dependent QT prolongation  Excessive serotonin activity/valvular damage Excessive serotonin activity/valvular damage Unknown  IgE mediated hypersensitivy, calcium channel blockade  Excessive serotonin activity/valvular damage Excessive serotonin  activity/valvular damage Valvular damage  Direct myofibrillar degeneration, adrenergic stimulation  Antimalarials Chloroquine Hydroxychloroquine Intracellular inhibition of lysosomal enzymes  Urologic Agents Alpha Blockers Doxazosin Prazosin Tamsulosin Terazosin  Increased renin and aldosterone  Adapted from Page Williemae Natter, et al. "Drugs That May Cause or Exacerbate Heart Failure: A Scientific Statement from the American Heart  Association." Circulation 2016; 134:e32-e69. DOI: 10.1161/CIR.0000000000000426   MEDICATION ADHERENCES TIPS AND STRATEGIES Taking medication as prescribed improves patient outcomes in heart failure (reduces hospitalizations, improves symptoms, increases survival) Side effects of medications can be managed by decreasing doses, switching agents, stopping drugs, or adding additional therapy. Please let someone in the Heart Failure Clinic know if you have having bothersome side effects so we can modify your regimen. Do not alter your medication regimen without talking to [place].  Medication reminders can help patients remember to take drugs on time. If you are missing or forgetting doses you can try linking behaviors, using pill boxes, or an electronic reminder like an alarm on your phone or an app. Some people can also get automated phone calls as medication reminders.

## 2021-02-28 NOTE — Progress Notes (Signed)
Patient ID: Scott Meyer, male    DOB: [birth year], 70 y.o.   MRN: [id number]  HPI  Scott Meyer is Meyer 70 y/o male with Meyer history of HTN, previous tobacco use and chronic heart failure.   Echo report from 11/28/20 reviewed and showed an EF of 25-30% along with moderate LVH.   Admitted 11/27/20 due to lower extremity swelling and HTN. Cardiology consult obtained. Cath recommended but patient declined. Mild AKI due to diuretics which improved after lasix held. Discharged after 3 days.   He presents today for Meyer follow-up visit with Meyer chief complaint of minimal shortness of breath upon moderate exertion. He says that this has been present for "awhile" but he can't quantify exactly how long. He has associated chest pain, light-headedness and pedal edema along with this. He denies any difficulty sleeping, abdominal distention, palpitations, cough, fatigue or weight gain.   Says that he infrequently takes his furosemide because he was taking it later in the day and it was causing him to get up during the night often to urinate.   Past Medical History:  Diagnosis Date   Cardiomyopathy (HCC)    Meyer. 11/2020 Echo: EF 25-30%, mod LVH, Gr1 DD. Mild AoV Ca2+ w/ mild to mod AoV sclerosis.   CHF (congestive heart failure) (HCC)    Hypertension    Past Surgical History:  Procedure Laterality Date   ARM AMPUTATION AT ELBOW Right    KNEE SURGERY     Family History  Problem Relation Age of Onset   Diabetes Mother    Cirrhosis Father    Alcohol abuse Father    Other Brother        gsw   Cancer Brother        'head full of tumors'   Other Brother        'killed w/ 2x4 to the head'   Other Sister        mva   Social History   Tobacco Use   Smoking status: Former    Pack years: 0.00    Types: Cigarettes    Quit date: 10/09/2017    Years since quitting: 3.3   Smokeless tobacco: Never  Substance Use Topics   Alcohol use: No    Comment: quit 8 months ago   No Known Allergies  Prior to Admission  medications   Medication Sig Start Date End Date Taking? Authorizing Provider  atorvastatin (LIPITOR) 40 MG tablet Take 1 tablet (40 mg total) by mouth daily. 12/26/20  Yes Scott Kindred A, FNP  losartan (COZAAR) 25 MG tablet Take 1 tablet (25 mg total) by mouth daily. 12/26/20  Yes Scott Kindred A, FNP  metoprolol succinate (TOPROL-XL) 25 MG 24 hr tablet Take 1 tablet (25 mg total) by mouth daily. 12/26/20  Yes Scott Meyer, Scott Fermo A, FNP  potassium chloride (KLOR-CON) 10 MEQ tablet Take 2 tablets (20 mEq total) by mouth daily. 12/26/20  Yes Scott Kindred A, FNP  furosemide (LASIX) 40 MG tablet Take 1 tablet (40 mg total) by mouth daily. Patient not taking: Reported on 02/28/2021 12/26/20   Scott Freeze, FNP  nitroGLYCERIN (NITROSTAT) 0.4 MG SL tablet Place 1 tablet (0.4 mg total) under the tongue every 5 (five) minutes as needed for chest pain. Patient not taking: Reported on 02/28/2021 11/30/20   Scott Cantor, MD  lisinopril (PRINIVIL,ZESTRIL) 10 MG tablet Take 1 tablet (10 mg total) by mouth daily. 06/21/18 04/08/19  Scott Mccallum, MD    Review of Systems  Constitutional:  Negative for appetite change and fatigue.  HENT:  Negative for congestion, postnasal drip and sore throat.   Eyes: Negative.   Respiratory:  Positive for shortness of breath. Negative for cough and chest tightness.   Cardiovascular:  Positive for chest pain (rare) and leg swelling. Negative for palpitations.  Gastrointestinal:  Negative for abdominal distention and abdominal pain.  Endocrine: Negative.   Genitourinary: Negative.   Musculoskeletal:  Negative for back pain and neck pain.  Skin: Negative.   Allergic/Immunologic: Negative.   Neurological:  Positive for light-headedness. Negative for dizziness.  Hematological:  Negative for adenopathy. Does not bruise/bleed easily.  Psychiatric/Behavioral:  Negative for dysphoric mood and sleep disturbance (sleeping on 1 pillow). The patient is not nervous/anxious.    Vitals:    02/28/21 1019  BP: (!) 166/89  Pulse: 64  Resp: 18  SpO2: 100%  Weight: 237 lb 2 oz (107.6 kg)  Height: 6\' 3"  (1.905 m)   Wt Readings from Last 3 Encounters:  02/28/21 237 lb 2 oz (107.6 kg)  12/26/20 233 lb 4 oz (105.8 kg)  12/01/20 233 lb (105.7 kg)   Lab Results  Component Value Date   CREATININE 0.98 02/28/2021   CREATININE 1.37 (H) 11/30/2020   CREATININE 1.58 (H) 11/29/2020     Physical Exam Vitals and nursing note reviewed. Exam conducted with Meyer chaperone present (sister).  Constitutional:      Appearance: Normal appearance.  HENT:     Head: Normocephalic and atraumatic.  Cardiovascular:     Rate and Rhythm: Regular rhythm. Bradycardia present.  Pulmonary:     Effort: Pulmonary effort is normal. No respiratory distress.     Breath sounds: No wheezing or rales.  Abdominal:     General: Abdomen is flat. There is no distension.     Palpations: Abdomen is soft.  Musculoskeletal:        General: Deformity (right arm amputation at elbow) present. No tenderness.     Cervical back: Normal range of motion and neck supple.     Right lower leg: Edema (2+ pitting) present.     Left lower leg: Edema (1+ pitting) present.  Skin:    General: Skin is warm and dry.  Neurological:     General: No focal deficit present.     Mental Status: He is alert and oriented to person, place, and time.  Psychiatric:        Mood and Affect: Mood normal.        Behavior: Behavior normal.        Thought Content: Thought content normal.   Assessment & Plan:  1: Chronic heart failure with reduced ejection fraction- - NYHA class II - euvolemic today - weighing daily; reminded to call for an overnight weight gain of > 2 pounds or Meyer weekly weight gain of > 5 pounds - weight up 4 pounds from last visit here 2 months ago - not adding salt but "just Meyer pinch" when eating potatoes, eggs or cube steak. Reinforced the importance of not adding salt to his food and reading food labels for sodium  content - already rinses canned vegetables - cardiology [facility]) saw patient during recent admission - on GDMT of losartan and metoprolol - consider changing losartan to entresto but concerned about BID dosing and compliance - HR will not allow for metoprolol titration - consider adding spironolactone, SGLT2 and then titrate losartan if can't change to entresto - agreeable to taking his furosemide in the mornings; was taking it infrequently because  he was taking it in the evenings - BNP 11/27/20 was 141.1 - PharmD reconciled medications with the patient  2: HTN- - BP mildly elevated but he hasn't been taking his furosemide; resuming per above - saw PCP Yetta Barre) 12/01/20; returns 09/22 - BMP 11/30/20 reviewed and showed sodium 135, potassium 3.5, creatinine 1.37 and GFR 55 - check BMP today to make sure potassium level is still ok   Patient did not bring his medications nor Meyer list. Each medication was verbally reviewed with the patient and he was encouraged to bring the bottles to every visit to confirm accuracy of list. He is completely unsure of what he is currently taking.   Return in 6 weeks or sooner for any questions/problems before then.

## 2021-03-16 IMAGING — DX DG CHEST 1V PORT
2 series · 2 of 2 positions shown · non-contrast
Comparison: None.

CLINICAL DATA: Hypertension

EXAM:
PORTABLE CHEST 1 VIEW

[chest ap (1 of 2)]
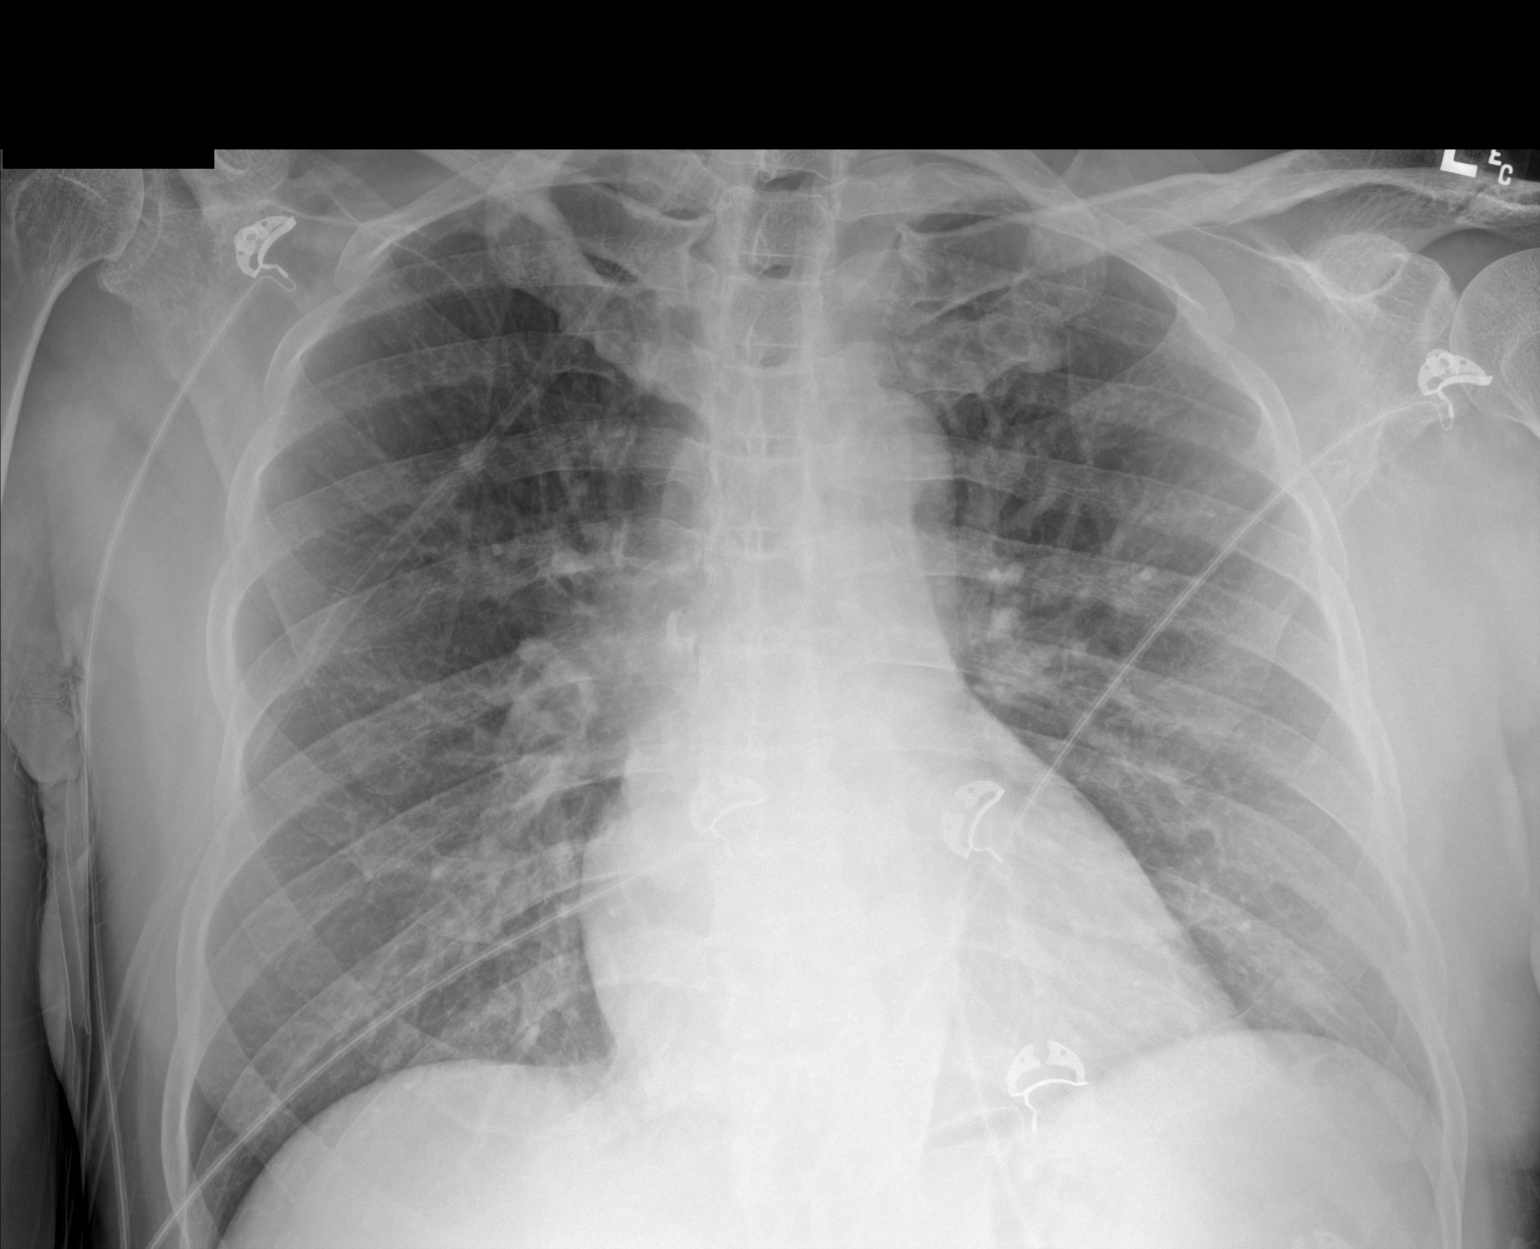

[chest ap (2 of 2)]
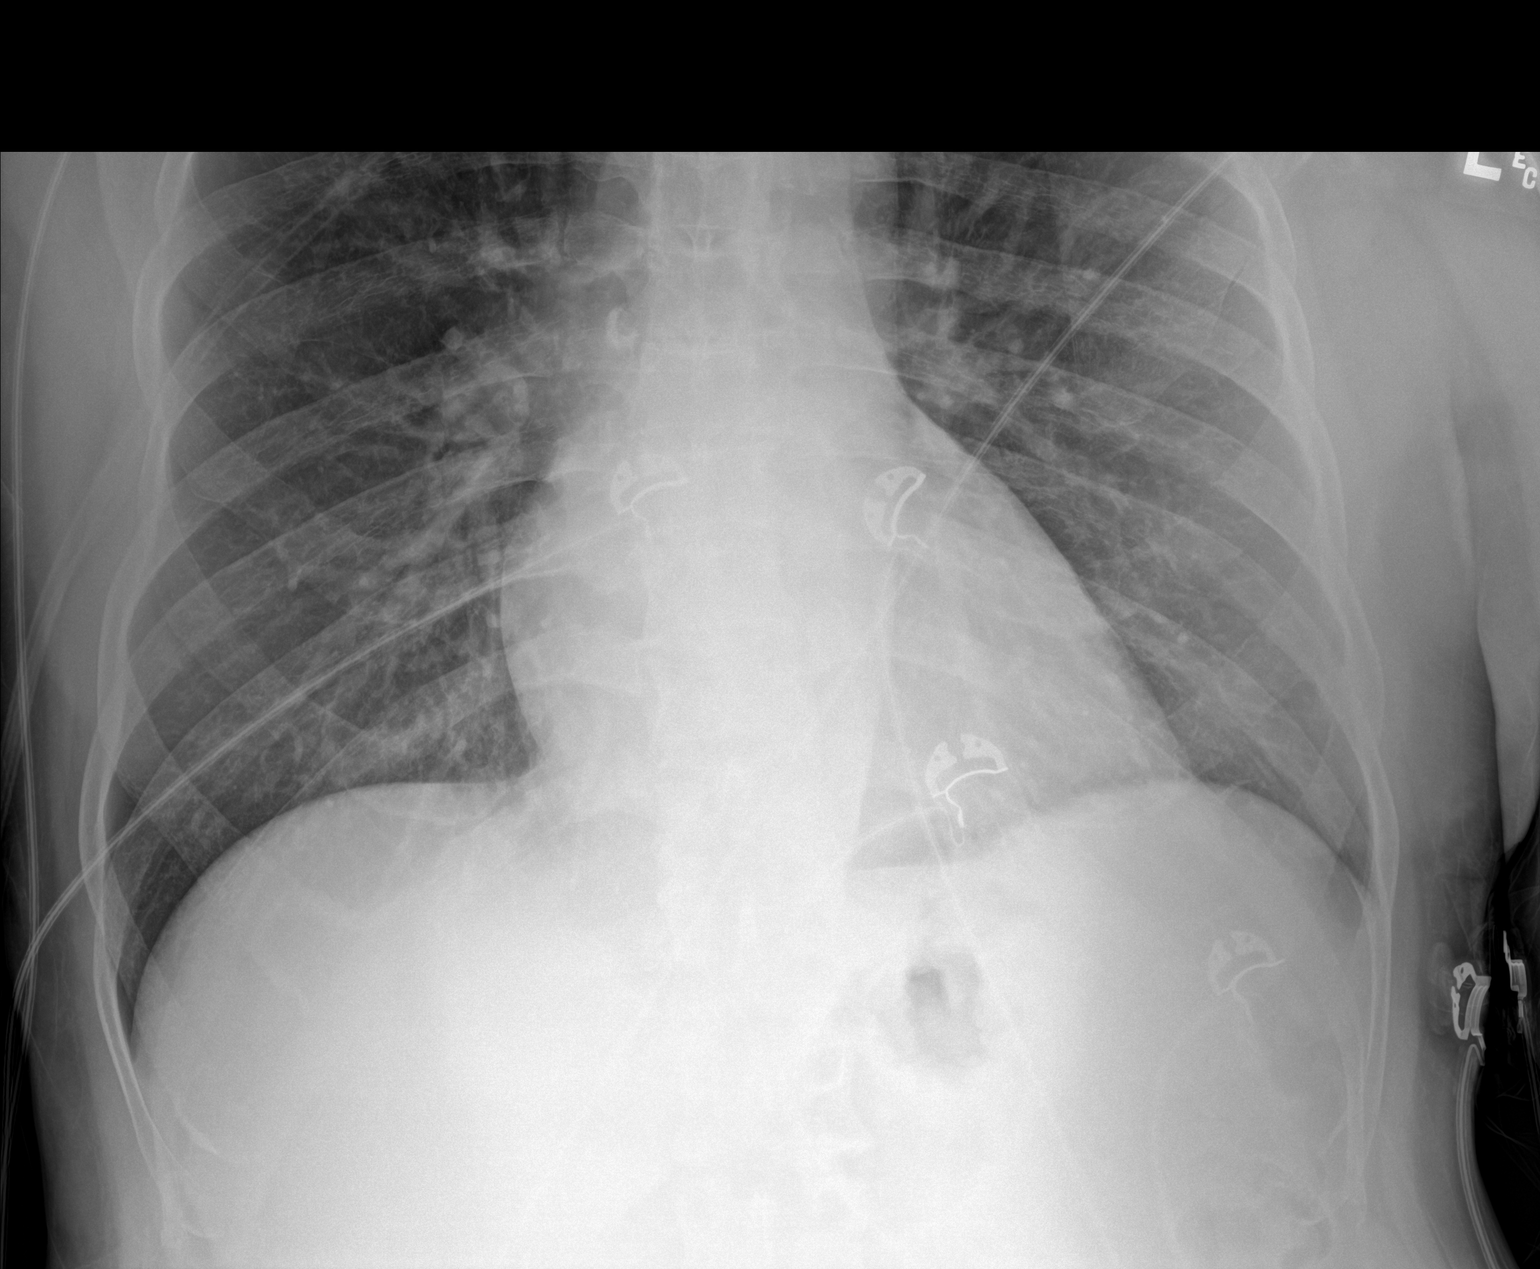

[2 of 2 positions shown; findings below may reference images not displayed]

FINDINGS: Mild pulmonary vascular congestion. No pleural effusion or
pneumothorax. Cardiomediastinal contours are within normal limits.
IMPRESSION: Mild pulmonary vascular congestion.

## 2021-03-16 IMAGING — US US EXTREM LOW VENOUS*R*
1 series · 13 of 24 positions shown · non-contrast
Comparison: None.

CLINICAL DATA: Initial evaluation for right lower extremity
swelling for 2 weeks, evaluate for DVT



[Series 1: us venous img lower uni right (dvt) · portal-venous · 13 of 33 slices shown]
[im 1/33]
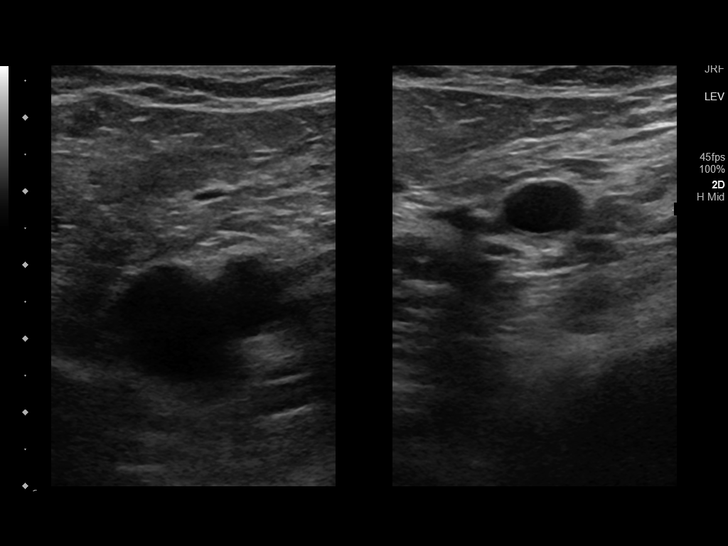
[im 3/33]
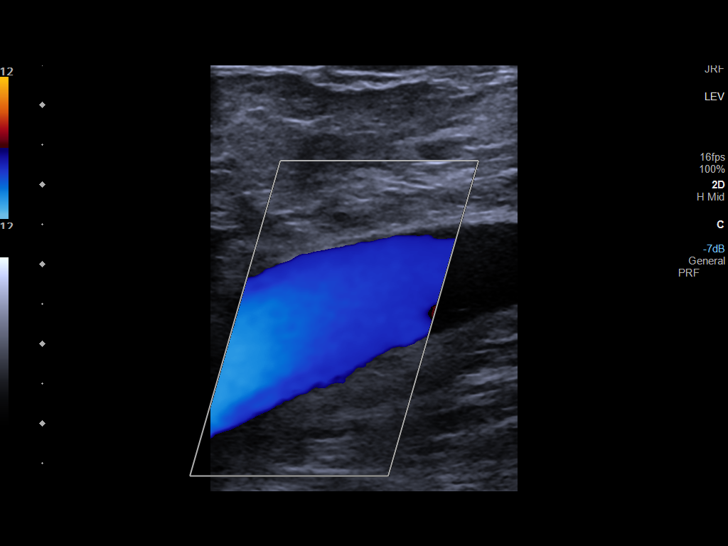
[im 6/33]
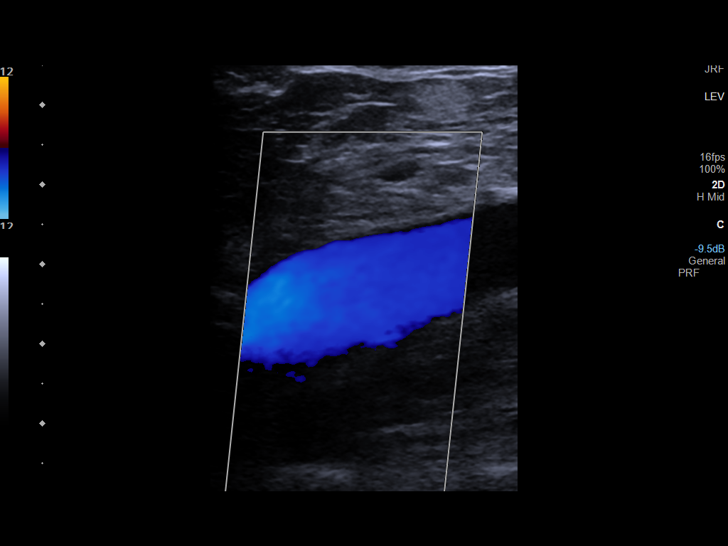
[im 9/33]
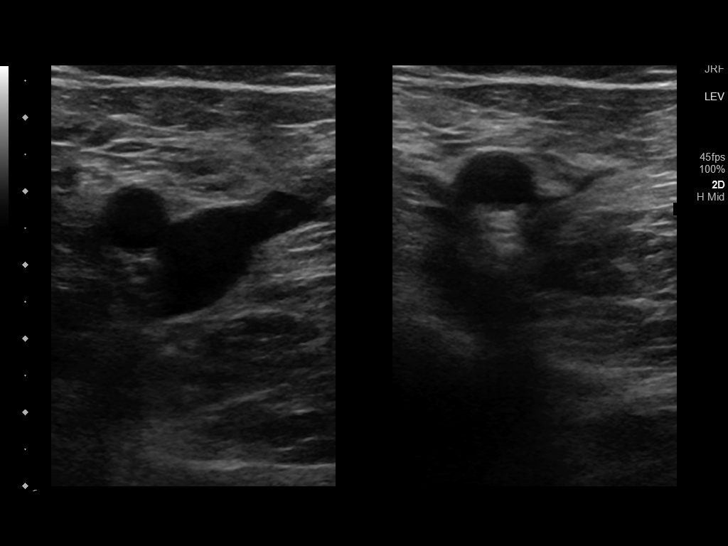
[im 12/33]
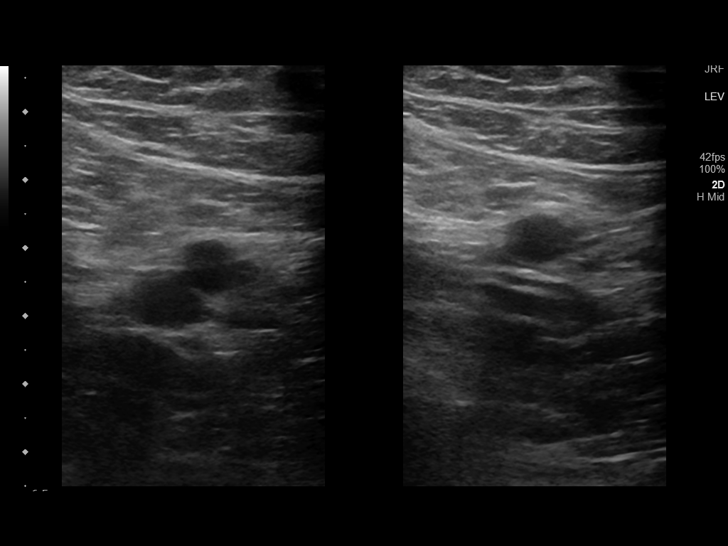
[im 14/33]
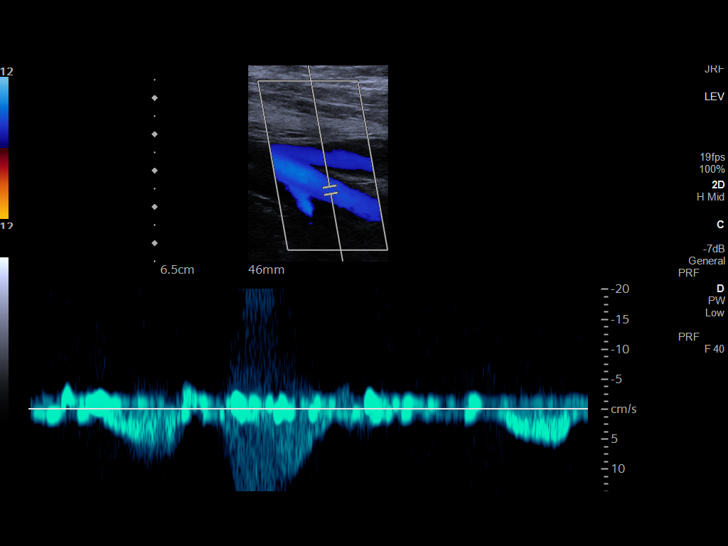
[im 17/33]
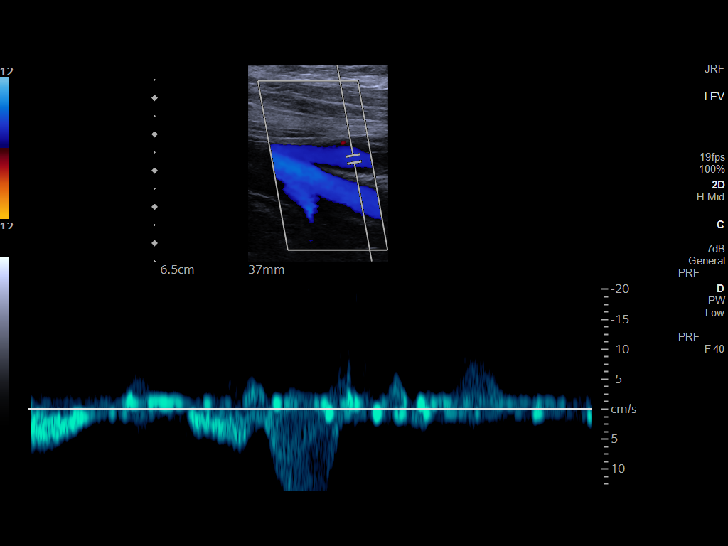
[im 19/33]
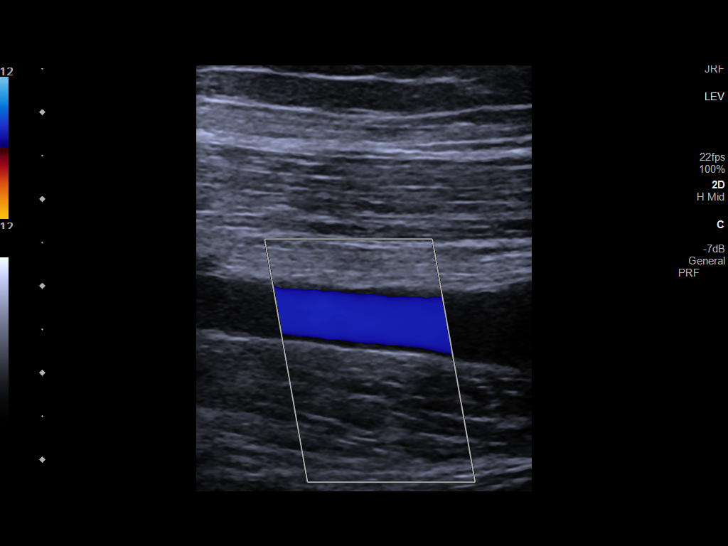
[im 21/33]
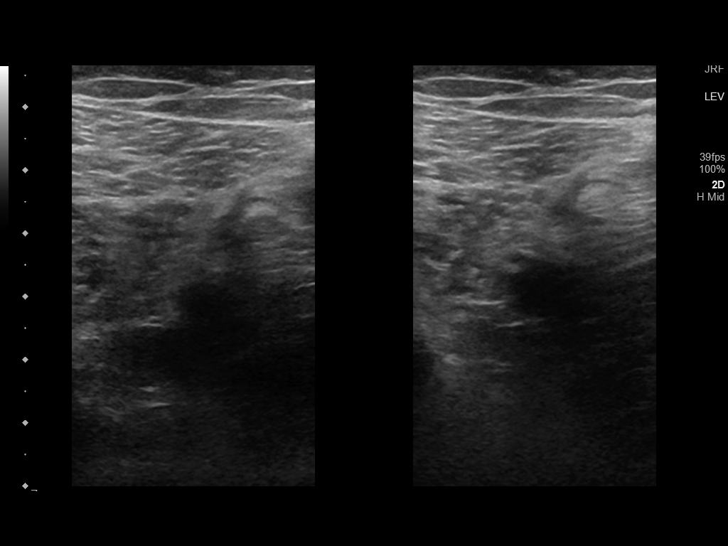
[im 24/33]
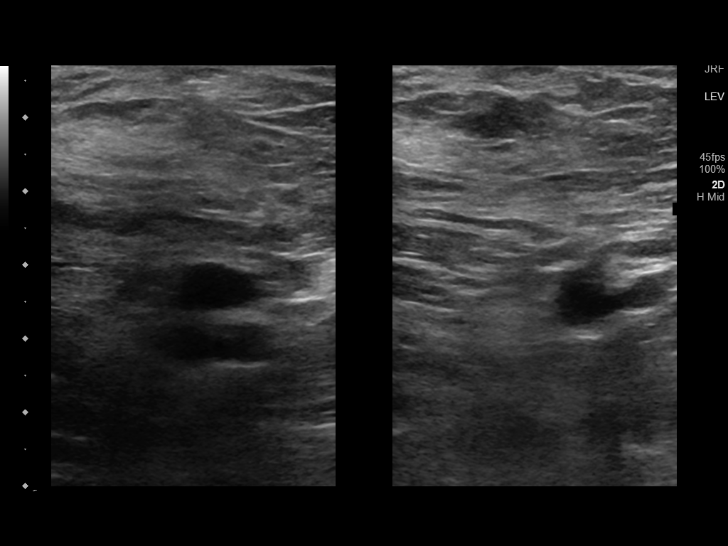
[im 27/33]
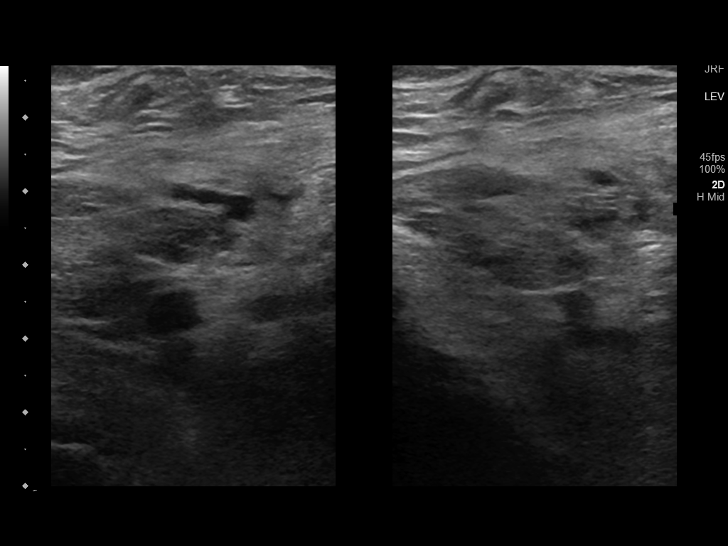
[im 30/33]
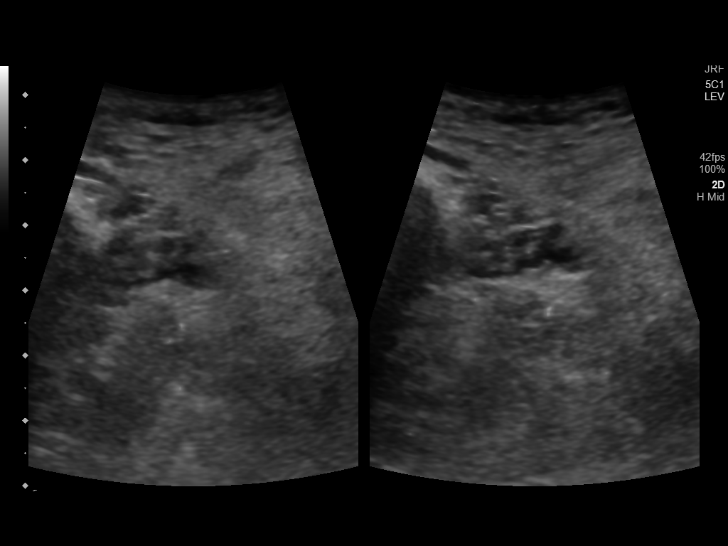
[im 33/33]
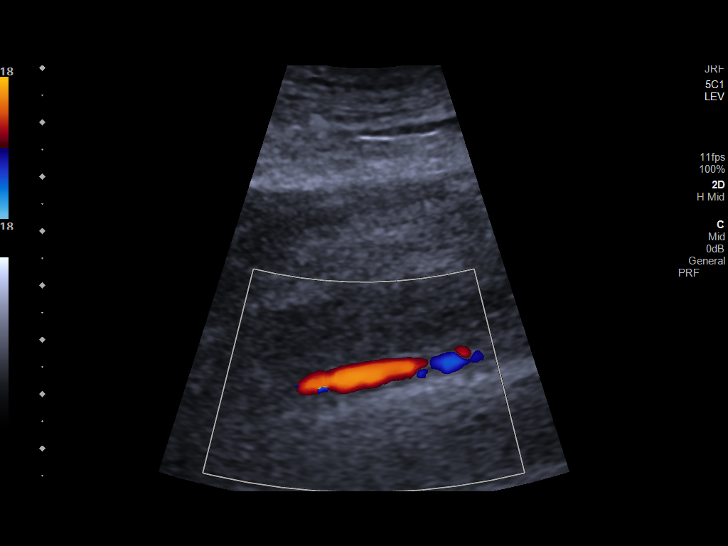

[13 of 24 positions shown; findings below may reference images not displayed]

FINDINGS: Contralateral Common Femoral Vein: Respiratory phasicity is normal
and symmetric with the symptomatic side. No evidence of thrombus.
Normal compressibility.

Common Femoral Vein: No evidence of thrombus. Normal
compressibility, respiratory phasicity and response to augmentation.

Saphenofemoral Junction: No evidence of thrombus. Normal
compressibility and flow on color Doppler imaging.

Profunda Femoral Vein: No evidence of thrombus. Normal
compressibility and flow on color Doppler imaging.

Femoral Vein: No evidence of thrombus. Normal compressibility,
respiratory phasicity and response to augmentation.

Popliteal Vein: No evidence of thrombus. Normal compressibility,
respiratory phasicity and response to augmentation.

Calf Veins: Veins of the calf are not well visualized on this
examination.

Superficial Great Saphenous Vein: No evidence of thrombus. Normal
compressibility.

Venous Reflux:  None.

Other Findings:  None.
IMPRESSION: No sonographic evidence of deep venous thrombosis.

## 2021-04-10 NOTE — Progress Notes (Signed)
Patient ID: Fitzhugh Vizcarrondo, male    DOB: April 20, 1951, 70 y.o.   MRN: [id number]  HPI  Mr Tully is a 70 y/o male with a history of HTN, previous tobacco use and chronic heart failure.   Echo report from 11/28/20 reviewed and showed an EF of 25-30% along with moderate LVH.   Admitted 11/27/20 due to lower extremity swelling and HTN. Cardiology consult obtained. Cath recommended but patient declined. Mild AKI due to diuretics which improved after lasix held. Discharged after 3 days.   He presents today for a follow-up visit with a chief complaint of minimal shortness of breath upon moderate exertion. He describes this as occurring on an intermittent basis. He has associated pedal edema (improving) and light-headedness along with this. He denies any difficulty sleeping, abdominal distention, palpitations, chest pain, cough, fatigue or weight gain.   Weighing ~ 3 times/ week but says that he can tell when his weight goes up. Says that the swelling in his legs goes down overnight but returns during the day and his right leg is worse than his left leg. Doesn't like to wear socks and hasn't worn compression socks in the past.   Says that he hasn't taken his medications yet today but is planning on doing so once he returns home.   Past Medical History:  Diagnosis Date   Cardiomyopathy (HCC)    a. 11/2020 Echo: EF 25-30%, mod LVH, Gr1 DD. Mild AoV Ca2+ w/ mild to mod AoV sclerosis.   CHF (congestive heart failure) (HCC)    Hypertension    Past Surgical History:  Procedure Laterality Date   ARM AMPUTATION AT ELBOW Right    KNEE SURGERY     Family History  Problem Relation Age of Onset   Diabetes Mother    Cirrhosis Father    Alcohol abuse Father    Other Brother        gsw   Cancer Brother        'head full of tumors'   Other Brother        'killed w/ 2x4 to the head'   Other Sister        mva   Social History   Tobacco Use   Smoking status: Former    Types: Cigarettes    Quit date:  10/09/2017    Years since quitting: 3.5   Smokeless tobacco: Never  Substance Use Topics   Alcohol use: No    Comment: quit 8 months ago   No Known Allergies  Prior to Admission medications   Medication Sig Start Date End Date Taking? Authorizing Provider  atorvastatin (LIPITOR) 40 MG tablet Take 1 tablet (40 mg total) by mouth daily. 12/26/20  Yes Clarisa Kindred A, FNP  furosemide (LASIX) 40 MG tablet Take 1 tablet (40 mg total) by mouth daily. 12/26/20  Yes Clarisa Kindred A, FNP  losartan (COZAAR) 25 MG tablet Take 1 tablet (25 mg total) by mouth daily. 12/26/20  Yes Clarisa Kindred A, FNP  metoprolol succinate (TOPROL-XL) 25 MG 24 hr tablet Take 1 tablet (25 mg total) by mouth daily. 12/26/20  Yes Eber Ferrufino, Inetta Fermo A, FNP  potassium chloride (KLOR-CON) 10 MEQ tablet Take 2 tablets (20 mEq total) by mouth daily. 12/26/20  Yes Esthela Brandner, Inetta Fermo A, FNP  nitroGLYCERIN (NITROSTAT) 0.4 MG SL tablet Place 1 tablet (0.4 mg total) under the tongue every 5 (five) minutes as needed for chest pain. Patient not taking: No sig reported 11/30/20   Calvert Cantor, MD  Review of Systems  Constitutional:  Negative for appetite change and fatigue.  HENT:  Negative for congestion, postnasal drip and sore throat.   Eyes: Negative.   Respiratory:  Positive for shortness of breath. Negative for cough and chest tightness.   Cardiovascular:  Positive for leg swelling (improving). Negative for chest pain and palpitations.  Gastrointestinal:  Negative for abdominal distention and abdominal pain.  Endocrine: Negative.   Genitourinary: Negative.   Musculoskeletal:  Negative for back pain and neck pain.  Skin: Negative.   Allergic/Immunologic: Negative.   Neurological:  Positive for light-headedness. Negative for dizziness.  Hematological:  Negative for adenopathy. Does not bruise/bleed easily.  Psychiatric/Behavioral:  Negative for dysphoric mood and sleep disturbance (sleeping on 1 pillow). The patient is not  nervous/anxious.    Vitals:   04/11/21 1021  BP: (!) 126/59  Pulse: 63  Resp: 16  SpO2: 100%  Weight: 236 lb (107 kg)  Height: 6\' 3"  (1.905 m)   Wt Readings from Last 3 Encounters:  04/11/21 236 lb (107 kg)  02/28/21 237 lb 2 oz (107.6 kg)  12/26/20 233 lb 4 oz (105.8 kg)   Lab Results  Component Value Date   CREATININE 0.98 02/28/2021   CREATININE 1.37 (H) 11/30/2020   CREATININE 1.58 (H) 11/29/2020    Physical Exam Vitals and nursing note reviewed. Exam conducted with a chaperone present (sister).  Constitutional:      Appearance: Normal appearance.  HENT:     Head: Normocephalic and atraumatic.  Cardiovascular:     Rate and Rhythm: Regular rhythm. Bradycardia present.  Pulmonary:     Effort: Pulmonary effort is normal. No respiratory distress.     Breath sounds: No wheezing or rales.  Abdominal:     General: Abdomen is flat. There is no distension.     Palpations: Abdomen is soft.  Musculoskeletal:        General: Deformity (right arm amputation at elbow) present. No tenderness.     Cervical back: Normal range of motion and neck supple.     Right lower leg: Edema (2+ pitting) present.     Left lower leg: Edema (1+ pitting) present.  Skin:    General: Skin is warm and dry.  Neurological:     General: No focal deficit present.     Mental Status: He is alert and oriented to person, place, and time.  Psychiatric:        Mood and Affect: Mood normal.        Behavior: Behavior normal.        Thought Content: Thought content normal.   Assessment & Plan:  1: Chronic heart failure with reduced ejection fraction- - NYHA class II - euvolemic today - weighing a few times/ week; encouraged to weigh daily so that he can call for an overnight weight gain of > 2 pounds or a weekly weight gain of > 5 pounds - weight stable from last visit here 6 weeks ago - not adding salt but "just a pinch" when eating potatoes, eggs or cube steak. Reinforced the importance of not adding  salt to his food and reading food labels for sodium content - already rinses canned vegetables - cardiology [facility]) saw patient during recent admission - on GDMT of losartan and metoprolol - consider changing losartan to entresto but concerned about BID dosing and compliance - HR will not allow for metoprolol titration - patient is not interested in starting any new medications - discussed getting compression socks and putting them  on every morning with removal at bedtime - BNP 11/27/20 was 141.1  2: HTN- - BP looks good today - saw PCP Yetta Barre) 12/01/20; returns 09/22 - BMP 02/28/21 reviewed and showed sodium 138, potassium 3.9, creatinine 0.98 and GFR >60   Patient did not bring his medications nor a list. Each medication was verbally reviewed with the patient and he was encouraged to bring the bottles to every visit to confirm accuracy of list.   Patient prefers to not make a return appointment at this time. Advised that he could call back at anytime for any questions or to make another appointment and he was comfortable with this plan.

## 2021-04-11 ENCOUNTER — Encounter: Payer: Self-pay | Admitting: Family

## 2021-04-11 ENCOUNTER — Ambulatory Visit: Payer: Medicare Other | Attending: Family | Admitting: Family

## 2021-04-11 ENCOUNTER — Other Ambulatory Visit: Payer: Self-pay

## 2021-04-11 VITALS — BP 126/59 | HR 63 | Resp 16 | Ht 75.0 in | Wt 236.0 lb

## 2021-04-11 DIAGNOSIS — R609 Edema, unspecified: Secondary | ICD-10-CM | POA: Diagnosis not present

## 2021-04-11 DIAGNOSIS — Z87891 Personal history of nicotine dependence: Secondary | ICD-10-CM | POA: Diagnosis not present

## 2021-04-11 DIAGNOSIS — Z79899 Other long term (current) drug therapy: Secondary | ICD-10-CM | POA: Diagnosis not present

## 2021-04-11 DIAGNOSIS — R42 Dizziness and giddiness: Secondary | ICD-10-CM | POA: Diagnosis not present

## 2021-04-11 DIAGNOSIS — I5022 Chronic systolic (congestive) heart failure: Secondary | ICD-10-CM | POA: Insufficient documentation

## 2021-04-11 DIAGNOSIS — I110 Hypertensive heart disease with heart failure: Secondary | ICD-10-CM | POA: Insufficient documentation

## 2021-04-11 DIAGNOSIS — I10 Essential (primary) hypertension: Secondary | ICD-10-CM

## 2021-04-11 NOTE — Patient Instructions (Addendum)
Continue weighing daily and call for an overnight weight gain of > 2 pounds or a weekly weight gain of >5 pounds.    Call us for any questions or if you would like to make another appointment

## 2021-05-22 ENCOUNTER — Ambulatory Visit: Payer: Medicare Other | Admitting: Family Medicine

## 2021-05-26 ENCOUNTER — Other Ambulatory Visit: Payer: Self-pay

## 2021-05-26 ENCOUNTER — Encounter: Payer: Self-pay | Admitting: Emergency Medicine

## 2021-05-26 ENCOUNTER — Ambulatory Visit
Admission: EM | Admit: 2021-05-26 | Discharge: 2021-05-26 | Disposition: A | Discharge status: Home / Self Care | Payer: Medicare Other | Attending: Family Medicine | Admitting: Family Medicine

## 2021-05-26 DIAGNOSIS — B353 Tinea pedis: Secondary | ICD-10-CM

## 2021-05-26 DIAGNOSIS — H60502 Unspecified acute noninfective otitis externa, left ear: Secondary | ICD-10-CM

## 2021-05-26 MED ORDER — CLOTRIMAZOLE 1 % EX CREA: TOPICAL_CREAM | CUTANEOUS | 0 refills | Status: DC

## 2021-05-26 MED ORDER — CIPROFLOXACIN-DEXAMETHASONE 0.3-0.1 % OT SUSP: 4.0000 [drp] | Freq: Two times a day (BID) | OTIC | 0 refills | Status: AC

## 2021-05-26 NOTE — ED Triage Notes (Signed)
Pt is present today with left ear pain. Pt states that sx started x2 days ago

## 2021-05-26 NOTE — ED Provider Notes (Signed)
[name]    CSN: [id number] Arrival date & time: 05/26/21  0948      History   Chief Complaint Chief Complaint  Patient presents with   Otalgia    HPI  70 year old male presents with the above complaint.  Patient is very hard of hearing.  He states that he is having left ear pain which started 2 days ago.  He states that every few years he has a "infection" of this year.  Pain is 6/10 in severity.  No relieving factors.  Additionally, patient reports that he is having itching of his feet/toes.  Worse at night.  He would like this addressed as well today.  Past Medical History:  Diagnosis Date   Cardiomyopathy (HCC)    a. 11/2020 Echo: EF 25-30%, mod LVH, Gr1 DD. Mild AoV Ca2+ w/ mild to mod AoV sclerosis.   CHF (congestive heart failure) (HCC)    Hypertension     Patient Active Problem List   Diagnosis Date Noted   Dilated cardiomyopathy (HCC) 11/30/2020   Atrial tachycardia (HCC) 11/30/2020   Frequent PVCs 11/30/2020   Troponin level elevated 11/29/2020   Benign essential HTN 11/29/2020   HLD (hyperlipidemia) 11/29/2020   Hypomagnesemia 11/29/2020   Amputation of right arm (HCC) 11/29/2020   Acute combined systolic and diastolic congestive heart failure (HCC) 11/29/2020   NSVT (nonsustained ventricular tachycardia) (HCC) 11/29/2020   Chest pain 11/27/2020    Past Surgical History:  Procedure Laterality Date   ARM AMPUTATION AT ELBOW Right    KNEE SURGERY     Home Medications    Prior to Admission medications   Medication Sig Start Date End Date Taking? Authorizing Provider  ciprofloxacin-dexamethasone (CIPRODEX) OTIC suspension Place 4 drops into the left ear 2 (two) times daily for 7 days. 05/26/21 06/02/21 Yes Nyheem Binette G, DO  clotrimazole (LOTRIMIN) 1 % cream Apply to affected area 2 times daily 05/26/21  Yes Yudith Norlander G, DO  atorvastatin (LIPITOR) 40 MG tablet Take 1 tablet (40 mg total) by mouth daily. 12/26/20   Delma Freeze, FNP   furosemide (LASIX) 40 MG tablet Take 1 tablet (40 mg total) by mouth daily. 12/26/20   Delma Freeze, FNP  losartan (COZAAR) 25 MG tablet Take 1 tablet (25 mg total) by mouth daily. 12/26/20   Delma Freeze, FNP  metoprolol succinate (TOPROL-XL) 25 MG 24 hr tablet Take 1 tablet (25 mg total) by mouth daily. 12/26/20   Delma Freeze, FNP  nitroGLYCERIN (NITROSTAT) 0.4 MG SL tablet Place 1 tablet (0.4 mg total) under the tongue every 5 (five) minutes as needed for chest pain. Patient not taking: No sig reported 11/30/20   Calvert Cantor, MD  potassium chloride (KLOR-CON) 10 MEQ tablet Take 2 tablets (20 mEq total) by mouth daily. 12/26/20   Delma Freeze, FNP  lisinopril (PRINIVIL,ZESTRIL) 10 MG tablet Take 1 tablet (10 mg total) by mouth daily. 06/21/18 04/08/19  Payton Mccallum, MD    Family History Family History  Problem Relation Age of Onset   Diabetes Mother    Cirrhosis Father    Alcohol abuse Father    Other Brother        gsw   Cancer Brother        'head full of tumors'   Other Brother        'killed w/ 2x4 to the head'   Other Sister        mva    Social History Social  History   Tobacco Use   Smoking status: Former    Types: Cigarettes    Quit date: 10/09/2017    Years since quitting: 3.6   Smokeless tobacco: Never  Vaping Use   Vaping Use: Never used  Substance Use Topics   Alcohol use: No    Comment: quit 8 months ago   Drug use: No     Allergies   Patient has no known allergies.   Review of Systems Review of Systems Per HPI  Physical Exam Triage Vital Signs ED Triage Vitals  Enc Vitals Group     BP 05/26/21 1043 (!) 147/77     Pulse Rate 05/26/21 1042 62     Resp 05/26/21 1042 17     Temp 05/26/21 1042 98 F (36.7 C)     Temp src --      SpO2 05/26/21 1042 100 %     Weight --      Height --      Head Circumference --      Peak Flow --      Pain Score 05/26/21 1041 6     Pain Loc --      Pain Edu? --      Excl. in GC? --    No data  found.  Updated Vital Signs BP (!) 147/77   Pulse 62   Temp 98 F (36.7 C)   Resp 17   SpO2 100%   Visual Acuity Right Eye Distance:   Left Eye Distance:   Bilateral Distance:    Right Eye Near:   Left Eye Near:    Bilateral Near:     Physical Exam Vitals and nursing note reviewed.  Constitutional:      General: He is not in acute distress. HENT:     Head: Normocephalic and atraumatic.     Ears:     Comments: Left ear -edema and debris in the canal.  Tragal tenderness. Eyes:     General:        Right eye: No discharge.        Left eye: No discharge.     Conjunctiva/sclera: Conjunctivae normal.  Pulmonary:     Effort: Pulmonary effort is normal. No respiratory distress.  Feet:     Comments: Tinea pedis noted. Neurological:     Mental Status: He is alert.  Psychiatric:        Mood and Affect: Mood normal.        Behavior: Behavior normal.     UC Treatments / Results  Labs (all labs ordered are listed, but only abnormal results are displayed) Labs Reviewed - No data to display  EKG   Radiology No results found.  Procedures Procedures (including critical care time)  Medications Ordered in UC Medications - No data to display  Initial Impression / Assessment and Plan / UC Course  I have reviewed the triage vital signs and the nursing notes.  Pertinent labs & imaging results that were available during my care of the patient were reviewed by me and considered in my medical decision making (see chart for details).    70 year old male presents with otitis externa.  Treating with Ciprodex.  Patient also has tinea pedis.  Lotrimin as prescribed.  Final Clinical Impressions(s) / UC Diagnoses   Final diagnoses:  Acute otitis externa of left ear, unspecified type  Tinea pedis, unspecified laterality   Discharge Instructions   None    ED Prescriptions     Medication  Sig Dispense Auth. Provider   ciprofloxacin-dexamethasone (CIPRODEX) OTIC suspension  Place 4 drops into the left ear 2 (two) times daily for 7 days. 7.5 mL Geovanna Simko G, DO   clotrimazole (LOTRIMIN) 1 % cream Apply to affected area 2 times daily 60 g Tommie Sams, DO      PDMP not reviewed this encounter.   Tommie Sams, [place] 05/26/21 1221

## 2021-06-13 ENCOUNTER — Ambulatory Visit (INDEPENDENT_AMBULATORY_CARE_PROVIDER_SITE_OTHER): Payer: Medicare Other | Admitting: Family Medicine

## 2021-06-13 ENCOUNTER — Ambulatory Visit
Admission: RE | Admit: 2021-06-13 | Discharge: 2021-06-13 | Disposition: A | Discharge status: Home / Self Care | Payer: Medicare Other | Source: Ambulatory Visit | Attending: Family Medicine | Admitting: Family Medicine

## 2021-06-13 ENCOUNTER — Other Ambulatory Visit: Payer: Self-pay

## 2021-06-13 ENCOUNTER — Ambulatory Visit
Admission: RE | Admit: 2021-06-13 | Discharge: 2021-06-13 | Disposition: A | Discharge status: Home / Self Care | Payer: Medicare Other | Attending: Family Medicine | Admitting: Family Medicine

## 2021-06-13 ENCOUNTER — Other Ambulatory Visit
Admission: RE | Admit: 2021-06-13 | Discharge: 2021-06-13 | Disposition: A | Discharge status: Home / Self Care | Payer: Medicare Other | Source: Home / Self Care | Attending: Family Medicine | Admitting: Family Medicine

## 2021-06-13 ENCOUNTER — Encounter: Payer: Self-pay | Admitting: Family Medicine

## 2021-06-13 VITALS — BP 120/76 | HR 52 | Ht 75.0 in | Wt 235.0 lb

## 2021-06-13 DIAGNOSIS — M542 Cervicalgia: Secondary | ICD-10-CM

## 2021-06-13 DIAGNOSIS — E7801 Familial hypercholesterolemia: Secondary | ICD-10-CM

## 2021-06-13 DIAGNOSIS — E785 Hyperlipidemia, unspecified: Secondary | ICD-10-CM | POA: Diagnosis not present

## 2021-06-13 DIAGNOSIS — I10 Essential (primary) hypertension: Secondary | ICD-10-CM

## 2021-06-13 LAB — RENAL FUNCTION PANEL
Albumin: 4.4 g/dL (ref 3.5–5.0)
Anion gap: 9 (ref 5–15)
BUN: 18 mg/dL (ref 8–23)
CO2: 23 mmol/L (ref 22–32)
Calcium: 9.6 mg/dL (ref 8.9–10.3)
Chloride: 102 mmol/L (ref 98–111)
Creatinine, Ser: 1 mg/dL (ref 0.61–1.24)
GFR, Estimated: >60 mL/min (ref >60)
Glucose, Bld: 96 mg/dL (ref 70–99)
Phosphorus: 2.7 mg/dL (ref 2.5–4.6)
Potassium: 4.1 mmol/L (ref 3.5–5.1)
Sodium: 134 mmol/L — ABNORMAL LOW (ref 135–145)

## 2021-06-13 LAB — LIPID PANEL
Cholesterol: 109 mg/dL (ref 0–200)
HDL: 36 mg/dL — ABNORMAL LOW (ref >40)
LDL Cholesterol: 63 mg/dL (ref 0–99)
Total CHOL/HDL Ratio: 3 RATIO
Triglycerides: 52 mg/dL (ref <150)
VLDL: 10 mg/dL (ref 0–40)

## 2021-06-13 NOTE — Progress Notes (Signed)
Date:  06/13/2021   Name:  Scott Meyer   DOB:  [birth year]   MRN:  [id number]   Chief Complaint: Hypertension, Hyperlipidemia, and Neck Pain  Hypertension This is a chronic problem. The current episode started more than 1 year ago. The problem has been gradually improving since onset. Associated symptoms include neck pain and peripheral edema. Pertinent negatives include no anxiety, blurred vision, chest pain, headaches, malaise/fatigue, palpitations or shortness of breath. There are no associated agents to hypertension. Past treatments include diuretics, beta blockers and angiotensin blockers. The current treatment provides moderate improvement. There are no compliance problems.  There is no history of angina, kidney disease, CAD/MI, CVA, heart failure, left ventricular hypertrophy, PVD or retinopathy. There is no history of chronic renal disease, a hypertension causing med or renovascular disease.  Hyperlipidemia This is a chronic problem. The current episode started more than 1 year ago. The problem is controlled. Recent lipid tests were reviewed and are normal. He has no history of chronic renal disease. Pertinent negatives include no chest pain, focal sensory loss, focal weakness, leg pain, myalgias or shortness of breath. Current antihyperlipidemic treatment includes statins. The current treatment provides moderate improvement of lipids. There are no compliance problems.  Risk factors for coronary artery disease include male sex, obesity, hypertension and dyslipidemia.  Neck Pain  This is a new problem. The current episode started in the past 7 days. The problem occurs constantly. The problem has been gradually worsening. The pain is associated with nothing. The pain is present in the midline and occipital region. The quality of the pain is described as aching. The pain is at a severity of 6/10. The pain is mild. The symptoms are aggravated by position. Stiffness is present In the morning.  Pertinent negatives include no chest pain, fever, headaches, leg pain, numbness, pain with swallowing, paresis, photophobia, syncope, tingling, trouble swallowing, visual change, weakness or weight loss. He has tried nothing for the symptoms.   Lab Results  Component Value Date   CREATININE 0.98 02/28/2021   BUN 18 02/28/2021   NA 138 02/28/2021   K 3.9 02/28/2021   CL 105 02/28/2021   CO2 23 02/28/2021   Lab Results  Component Value Date   CHOL 198 11/29/2020   HDL 32 (L) 11/29/2020   LDLCALC 147 (H) 11/29/2020   TRIG 93 11/29/2020   CHOLHDL 6.2 11/29/2020   Lab Results  Component Value Date   TSH 1.745 11/27/2020   Lab Results  Component Value Date   HGBA1C 4.9 11/29/2020   Lab Results  Component Value Date   WBC 8.7 11/28/2020   HGB 15.1 11/28/2020   HCT 45.6 11/28/2020   MCV 92.1 11/28/2020   PLT 165 11/28/2020   Lab Results  Component Value Date   ALT 24 11/27/2020   AST 30 11/27/2020   ALKPHOS 81 11/27/2020   BILITOT 1.2 11/27/2020     Review of Systems  Constitutional:  Negative for chills, fever, malaise/fatigue and weight loss.  HENT:  Negative for drooling, ear discharge, ear pain, sore throat and trouble swallowing.   Eyes:  Negative for blurred vision and photophobia.  Respiratory:  Negative for cough, shortness of breath and wheezing.   Cardiovascular:  Negative for chest pain, palpitations, leg swelling and syncope.  Gastrointestinal:  Negative for abdominal pain, blood in stool, constipation, diarrhea and nausea.  Endocrine: Negative for polydipsia.  Genitourinary:  Negative for dysuria, frequency, hematuria and urgency.  Musculoskeletal:  Positive for neck pain. Negative  for back pain and myalgias.  Skin:  Negative for rash.  Allergic/Immunologic: Negative for environmental allergies.  Neurological:  Negative for dizziness, tingling, focal weakness, weakness, numbness and headaches.  Hematological:  Does not bruise/bleed easily.   Psychiatric/Behavioral:  Negative for suicidal ideas. The patient is not nervous/anxious.    Patient Active Problem List   Diagnosis Date Noted   Dilated cardiomyopathy (HCC) 11/30/2020   Atrial tachycardia (HCC) 11/30/2020   Frequent PVCs 11/30/2020   Troponin level elevated 11/29/2020   Benign essential HTN 11/29/2020   HLD (hyperlipidemia) 11/29/2020   Hypomagnesemia 11/29/2020   Amputation of right arm (HCC) 11/29/2020   Acute combined systolic and diastolic congestive heart failure (HCC) 11/29/2020   NSVT (nonsustained ventricular tachycardia) (HCC) 11/29/2020   Chest pain 11/27/2020    No Known Allergies  Past Surgical History:  Procedure Laterality Date   ARM AMPUTATION AT ELBOW Right    KNEE SURGERY      Social History   Tobacco Use   Smoking status: Former    Types: Cigarettes    Quit date: 10/09/2017    Years since quitting: 3.6   Smokeless tobacco: Never  Vaping Use   Vaping Use: Never used  Substance Use Topics   Alcohol use: No    Comment: quit 8 months ago   Drug use: No     Medication list has been reviewed and updated.  Current Meds  Medication Sig   atorvastatin (LIPITOR) 40 MG tablet Take 1 tablet (40 mg total) by mouth daily.   clotrimazole (LOTRIMIN) 1 % cream Apply to affected area 2 times daily   losartan (COZAAR) 25 MG tablet Take 1 tablet (25 mg total) by mouth daily.   metoprolol succinate (TOPROL-XL) 25 MG 24 hr tablet Take 1 tablet (25 mg total) by mouth daily.   potassium chloride (KLOR-CON) 10 MEQ tablet Take 2 tablets (20 mEq total) by mouth daily. (Patient taking differently: Take 10 mEq by mouth daily.)    PHQ 2/9 Scores 04/11/2021 12/01/2020 06/16/2019 05/03/2019  PHQ - 2 Score 0 0 0 0  PHQ- 9 Score - - 0 0    GAD 7 : Generalized Anxiety Score 12/26/2020  Nervous, Anxious, on Edge 0  Control/stop worrying 0  Worry too much - different things 0  Trouble relaxing 0  Restless 0  Easily annoyed or irritable 0  Afraid - awful  might happen 0  Total GAD 7 Score 0  Anxiety Difficulty Not difficult at all    BP Readings from Last 3 Encounters:  05/26/21 (!) 147/77  04/11/21 (!) 126/59  02/28/21 (!) 166/89    Physical Exam Vitals and nursing note reviewed.  HENT:     Head: Normocephalic.     Right Ear: External ear normal.     Left Ear: External ear normal.     Nose: No congestion or rhinorrhea.  Eyes:     General: No scleral icterus.       Right eye: No discharge.        Left eye: No discharge.     Conjunctiva/sclera: Conjunctivae normal.     Pupils: Pupils are equal, round, and reactive to light.  Neck:     Thyroid: No thyroid mass, thyromegaly or thyroid tenderness.     Vascular: No JVD.     Trachea: No tracheal deviation.  Cardiovascular:     Rate and Rhythm: Normal rate and regular rhythm.     Heart sounds: Normal heart sounds, S1 normal and S2 normal.  No murmur heard. No systolic murmur is present.  No diastolic murmur is present.    No friction rub. No gallop. No S3 or S4 sounds.  Pulmonary:     Effort: No respiratory distress.     Breath sounds: Normal breath sounds. No wheezing, rhonchi or rales.  Abdominal:     General: Bowel sounds are normal.     Palpations: Abdomen is soft. There is no mass.     Tenderness: There is no abdominal tenderness. There is no guarding or rebound.  Musculoskeletal:        General: No tenderness. Normal range of motion.     Cervical back: Normal range of motion and neck supple. Spasms present. No rigidity or tenderness.  Lymphadenopathy:     Cervical: No cervical adenopathy.     Right cervical: No superficial, deep or posterior cervical adenopathy.    Left cervical: No superficial, deep or posterior cervical adenopathy.  Skin:    General: Skin is warm.     Findings: No rash.  Neurological:     Mental Status: He is alert and oriented to person, place, and time.     Cranial Nerves: Cranial nerves are intact. No cranial nerve deficit.     Sensory:  Sensation is intact.     Motor: Motor function is intact.     Deep Tendon Reflexes: Reflexes are normal and symmetric.    Wt Readings from Last 3 Encounters:  06/13/21 235 lb (106.6 kg)  04/11/21 236 lb (107 kg)  02/28/21 237 lb 2 oz (107.6 kg)    Ht 6\' 3"  (1.905 m)   Wt 235 lb (106.6 kg)   BMI 29.37 kg/m   Assessment and Plan:  1. Essential hypertension Chronic.  Controlled.  Stable.  Blood pressure 120/76.  Patient is followed for blood pressure and an heart failure in heart failure clinic.  Patient has been encouraged to continue on his medications as provided by the a.m. of losartan 25 mg metoprolol XL 25 mg and furosemide which she has not been taking have been encouraged to resume 40 mg once a day.  Will check renal function panel. - Renal Function Panel  2. Familial hypercholesterolemia Chronic.  Controlled.  Stable.  Patient is to continue on his atorvastatin 40 mg once a day as provided by heart failure clinic.  We will recheck with lipid panel. - Lipid Panel With LDL/HDL Ratio  3. Neck pain Patient is having 4 to 5 days of neck pain with no palpable bony tenderness but some spasm of the neck there is been no injury.  We will obtain cervical spine x-ray which I suspect will show arthritis but patient has been encouraged to use Tylenol for pain in the event that we would prefer not to use NSAIDs given his systolic congestive heart failure condition. - DG Cervical Spine Complete; Future

## 2021-06-19 NOTE — Progress Notes (Signed)
Please let pt know her results.  [facility] nurse may give results to patient if they return call to clinic, a CRM has been created.  KP

## 2021-06-20 ENCOUNTER — Other Ambulatory Visit: Payer: Self-pay | Admitting: Family

## 2021-06-21 ENCOUNTER — Encounter: Payer: Self-pay | Admitting: Family

## 2021-06-21 ENCOUNTER — Other Ambulatory Visit: Payer: Self-pay

## 2021-06-21 ENCOUNTER — Ambulatory Visit: Payer: Medicare Other | Attending: Family | Admitting: Family

## 2021-06-21 VITALS — BP 148/83 | HR 84 | Resp 18 | Ht 75.0 in | Wt 232.5 lb

## 2021-06-21 DIAGNOSIS — Z79899 Other long term (current) drug therapy: Secondary | ICD-10-CM | POA: Insufficient documentation

## 2021-06-21 DIAGNOSIS — I5022 Chronic systolic (congestive) heart failure: Secondary | ICD-10-CM | POA: Insufficient documentation

## 2021-06-21 DIAGNOSIS — I10 Essential (primary) hypertension: Secondary | ICD-10-CM

## 2021-06-21 DIAGNOSIS — I110 Hypertensive heart disease with heart failure: Secondary | ICD-10-CM | POA: Diagnosis present

## 2021-06-21 DIAGNOSIS — F17210 Nicotine dependence, cigarettes, uncomplicated: Secondary | ICD-10-CM | POA: Diagnosis not present

## 2021-06-21 DIAGNOSIS — R0789 Other chest pain: Secondary | ICD-10-CM | POA: Insufficient documentation

## 2021-06-21 DIAGNOSIS — Z720 Tobacco use: Secondary | ICD-10-CM | POA: Diagnosis not present

## 2021-06-21 MED ORDER — POTASSIUM CHLORIDE ER 10 MEQ PO TBCR: 10.0000 meq | EXTENDED_RELEASE_TABLET | Freq: Every day | ORAL | 6 refills | Status: DC

## 2021-06-21 NOTE — Patient Instructions (Signed)
Continue weighing daily and call for an overnight weight gain of > 2 pounds or a weekly weight gain of >5 pounds. 

## 2021-06-21 NOTE — Progress Notes (Signed)
Called cell number could not leave VM.  [facility] nurse may give results to patient if they return call to clinic, a CRM has been created.  KP

## 2021-06-21 NOTE — Progress Notes (Signed)
Patient ID: Scott Meyer, male    DOB: July 06, 1951, 70 y.o.   MRN: [id number]  HPI  Scott Meyer is a 70 y/o male with a history of HTN, previous tobacco use and chronic heart failure.   Echo report from 11/28/20 reviewed and showed an EF of 25-30% along with moderate LVH.   Hasn't been admitted or been in the ED in the last 6 months.   He presents today for a follow-up visit with a chief complaint of minimal shortness of breath upon moderate exertion. He describes this as chronic in nature having been present for several years. He has associated pedal edema, intermittent chest pain, neck pain and light-headedness along with this. He denies any difficulty sleeping, abdominal distention, palpitations, cough, fatigue or weight gain.   He mentions feeling like there's a "ball in my chest that travels across the chest". He says that he knows the ball is helping his heart and he wants to keep it. He says that when he smokes, the ball goes away and he doesn't like that, so he's trying to not smoke. When asked to clarify what he meant by a ball, he was unable to say anything different except that he sees black lines sometimes and he doesn't know if that is related to the ball (possibly floaters). Says that he's been taking all of his medications for the last week but couldn't say why he had stopped taking them.   Denies any alcohol or drug use.   Did not take any of his medications yet today but does need refills on everything. This is why he came today because he said his PCP office would not order the refills.   Past Medical History:  Diagnosis Date   Cardiomyopathy (HCC)    a. 11/2020 Echo: EF 25-30%, mod LVH, Gr1 DD. Mild AoV Ca2+ w/ mild to mod AoV sclerosis.   CHF (congestive heart failure) (HCC)    Hypertension    Past Surgical History:  Procedure Laterality Date   ARM AMPUTATION AT ELBOW Right    KNEE SURGERY     Family History  Problem Relation Age of Onset   Diabetes Mother     Cirrhosis Father    Alcohol abuse Father    Other Brother        gsw   Cancer Brother        'head full of tumors'   Other Brother        'killed w/ 2x4 to the head'   Other Sister        mva   Social History   Tobacco Use   Smoking status: Former    Types: Cigarettes    Quit date: 10/09/2017    Years since quitting: 3.7   Smokeless tobacco: Never  Substance Use Topics   Alcohol use: No    Comment: quit 8 months ago   No Known Allergies  Prior to Admission medications   Medication Sig Start Date End Date Taking? Authorizing Provider  atorvastatin (LIPITOR) 40 MG tablet TAKE 1 TABLET(40 MG) BY MOUTH DAILY 06/21/21   Clarisa Kindred A, FNP  clotrimazole (LOTRIMIN) 1 % cream Apply to affected area 2 times daily 05/26/21   Everlene Other G, DO  furosemide (LASIX) 40 MG tablet TAKE 1 TABLET(40 MG) BY MOUTH DAILY 06/21/21   Clarisa Kindred A, FNP  losartan (COZAAR) 25 MG tablet TAKE 1 TABLET(25 MG) BY MOUTH DAILY 06/21/21   Clarisa Kindred A, FNP  metoprolol succinate (TOPROL-XL) 25 MG  24 hr tablet TAKE 1 TABLET(25 MG) BY MOUTH DAILY 06/21/21   Clarisa Kindred A, FNP  nitroGLYCERIN (NITROSTAT) 0.4 MG SL tablet Place 1 tablet (0.4 mg total) under the tongue every 5 (five) minutes as needed for chest pain. Patient not taking: No sig reported 11/30/20   Calvert Cantor, MD  potassium chloride (KLOR-CON) 10 MEQ tablet Take 1 tablet (10 mEq total) by mouth daily. 06/21/21   Delma Freeze, FNP   Review of Systems  Constitutional:  Negative for appetite change and fatigue.  HENT:  Negative for congestion, postnasal drip and sore throat.   Eyes: Negative.   Respiratory:  Positive for shortness of breath. Negative for cough and chest tightness.   Cardiovascular:  Positive for chest pain (rarely) and leg swelling (improving). Negative for palpitations.  Gastrointestinal:  Negative for abdominal distention and abdominal pain.  Endocrine: Negative.   Genitourinary: Negative.   Musculoskeletal:  Positive for  neck pain. Negative for back pain.  Skin: Negative.   Allergic/Immunologic: Negative.   Neurological:  Positive for light-headedness. Negative for dizziness.  Hematological:  Negative for adenopathy. Does not bruise/bleed easily.  Psychiatric/Behavioral:  Negative for dysphoric mood and sleep disturbance (sleeping on 1 pillow). The patient is not nervous/anxious.    Vitals:   06/21/21 1207 06/21/21 1225  BP: (!) 174/63 (!) 148/83  Pulse: 84   Resp: 18   SpO2: 99%   Weight: 232 lb 8 oz (105.5 kg)   Height: 6\' 3"  (1.905 m)    Wt Readings from Last 3 Encounters:  06/21/21 232 lb 8 oz (105.5 kg)  06/13/21 235 lb (106.6 kg)  04/11/21 236 lb (107 kg)   Lab Results  Component Value Date   CREATININE 1.00 06/13/2021   CREATININE 0.98 02/28/2021   CREATININE 1.37 (H) 11/30/2020   Physical Exam Vitals and nursing note reviewed. Exam conducted with a chaperone present (sister).  Constitutional:      Appearance: Normal appearance.  HENT:     Head: Normocephalic and atraumatic.  Cardiovascular:     Rate and Rhythm: Normal rate and regular rhythm.  Pulmonary:     Effort: Pulmonary effort is normal. No respiratory distress.     Breath sounds: No wheezing or rales.  Abdominal:     General: Abdomen is flat. There is no distension.     Palpations: Abdomen is soft.  Musculoskeletal:        General: Deformity (right arm amputation at elbow) present. No tenderness.     Cervical back: Normal range of motion and neck supple.     Right lower leg: Edema (1+ pitting) present.     Left lower leg: Edema (trace pitting) present.  Skin:    General: Skin is warm and dry.  Neurological:     General: No focal deficit present.     Mental Status: He is alert and oriented to person, place, and time.  Psychiatric:        Mood and Affect: Mood normal.        Behavior: Behavior normal.        Thought Content: Thought content normal.   Assessment & Plan:  1: Chronic heart failure with reduced  ejection fraction- - NYHA class II - euvolemic today - weighing a few times/ week; encouraged to weigh daily so that he can call for an overnight weight gain of > 2 pounds or a weekly weight gain of > 5 pounds - weight down 4 pounds from last visit here 2 months ago -  not adding salt but "just a pinch" when eating potatoes, eggs or cube steak. Reinforced the importance of not adding salt to his food and reading food labels for sodium content - already rinses canned vegetables - on GDMT of losartan and metoprolol - consider changing losartan to entresto but concerned about BID dosing and compliance - patient is not interested in starting any new medications - BNP 11/27/20 was 141.1  2: HTN- - BP elevated (174/63) initially with improvement upon recheck (148/83) - saw PCP Yetta Barre) 06/13/21 - BMP 06/13/21 reviewed and showed sodium 134, potassium 4.1, creatinine 1.00 and GFR >60  3: Tobacco use- - smoking 2 cigarettes but is trying to quit because when he smokes "the ball in the chest disappears" - complete cessation discussed for 3 minutes with him   Medication bottles reviewed. Encouraged him to follow-up with PCP regarding the ball sensation in his chest.   Return in 6 months or sooner for any questions/problems before then.

## 2021-07-10 ENCOUNTER — Telehealth: Payer: Self-pay | Admitting: Family Medicine

## 2021-07-10 NOTE — Telephone Encounter (Signed)
Copied from CRM [id number]. Topic: Medicare AWV >> Jul 10, 2021  1:06 PM Claudette Laws R wrote: Reason for CRM:   No answer unable to leave a message for patient to call back and schedule Medicare Annual Wellness Visit (AWV) in office.   If unable to come into the office for AWV,  please offer to do virtually or by telephone.  No hx of AWV eligible for AWVI as of 09/16/2009 per [employer]  Please schedule at anytime with [facility] Advisor.      40 Minutes appointment   Any questions, please call me at [phone]

## 2021-10-10 ENCOUNTER — Ambulatory Visit: Payer: Medicare Other

## 2021-12-11 ENCOUNTER — Ambulatory Visit: Payer: Medicare Other | Admitting: Family Medicine

## 2021-12-20 ENCOUNTER — Encounter: Payer: Self-pay | Admitting: Family

## 2021-12-20 ENCOUNTER — Ambulatory Visit: Payer: Medicare Other | Attending: Family | Admitting: Family

## 2021-12-20 VITALS — BP 155/92 | HR 42 | Resp 18 | Ht 75.0 in | Wt 239.4 lb

## 2021-12-20 DIAGNOSIS — I110 Hypertensive heart disease with heart failure: Secondary | ICD-10-CM | POA: Insufficient documentation

## 2021-12-20 DIAGNOSIS — I10 Essential (primary) hypertension: Secondary | ICD-10-CM

## 2021-12-20 DIAGNOSIS — F17210 Nicotine dependence, cigarettes, uncomplicated: Secondary | ICD-10-CM | POA: Insufficient documentation

## 2021-12-20 DIAGNOSIS — I5022 Chronic systolic (congestive) heart failure: Secondary | ICD-10-CM | POA: Diagnosis present

## 2021-12-20 DIAGNOSIS — Z79899 Other long term (current) drug therapy: Secondary | ICD-10-CM | POA: Insufficient documentation

## 2021-12-20 DIAGNOSIS — Z720 Tobacco use: Secondary | ICD-10-CM

## 2021-12-20 MED ORDER — LOSARTAN POTASSIUM 25 MG PO TABS: ORAL_TABLET | ORAL | 6 refills | Status: DC

## 2021-12-20 NOTE — Progress Notes (Signed)
? Patient ID: Scott Meyer, male    DOB: 1951/01/13, 71 y.o.   MRN: [id number] ? ?HPI ? ?Mr Shreve is a 71 y/o male with a history of HTN, previous tobacco use and chronic heart failure.  ? ?Echo report from 11/28/20 reviewed and showed an EF of 25-30% along with moderate LVH.  ? ?Hasn't been admitted or been in the ED in the last 6 months.  ? ?He presents today for a follow-up visit with a chief complaint of minimal shortness of breath upon moderate exertion. Describes this as chronic in nature. Has associated pedal edema, neck pain and anxiety along with this. He denies any difficulty sleeping, dizziness, abdominal distention, palpitations, chest pain, cough or fatigue although it's hard for him to accurately describe how he's feeling.  ? ?He still says that there's a "ball in my chest that travels across the chest". He says that he knows the ball is helping his heart and he wants to keep it. Says that he feels better knowing this ball is in his chest.  ? ?Past Medical History:  ?Diagnosis Date  ? Cardiomyopathy (HCC)   ? a. 11/2020 Echo: EF 25-30%, mod LVH, Gr1 DD. Mild AoV Ca2+ w/ mild to mod AoV sclerosis.  ? CHF (congestive heart failure) (HCC)   ? Hypertension   ? ?Past Surgical History:  ?Procedure Laterality Date  ? ARM AMPUTATION AT ELBOW Right   ? KNEE SURGERY    ? ?Family History  ?Problem Relation Age of Onset  ? Diabetes Mother   ? Cirrhosis Father   ? Alcohol abuse Father   ? Other Brother   ?     gsw  ? Cancer Brother   ?     'head full of tumors'  ? Other Brother   ?     'killed w/ 2x4 to the head'  ? Other Sister   ?     mva  ? ?Social History  ? ?Tobacco Use  ? Smoking status: Former  ?  Types: Cigarettes  ?  Quit date: 10/09/2017  ?  Years since quitting: 4.2  ? Smokeless tobacco: Never  ?Substance Use Topics  ? Alcohol use: No  ?  Comment: quit 8 months ago  ? ?No Known Allergies ? ?Prior to Admission medications   ?Medication Sig Start Date End Date Taking? Authorizing Provider  ?metoprolol  succinate (TOPROL-XL) 25 MG 24 hr tablet TAKE 1 TABLET(25 MG) BY MOUTH DAILY ?Patient taking differently: 25 mg every other day. 06/21/21  Yes Clarisa Kindred A, FNP  ?nitroGLYCERIN (NITROSTAT) 0.4 MG SL tablet Place 1 tablet (0.4 mg total) under the tongue every 5 (five) minutes as needed for chest pain. 11/30/20  Yes Calvert Cantor, MD  ?atorvastatin (LIPITOR) 40 MG tablet TAKE 1 TABLET(40 MG) BY MOUTH DAILY ?Patient not taking: Reported on 12/20/2021 06/21/21   Delma Freeze, FNP  ?furosemide (LASIX) 40 MG tablet TAKE 1 TABLET(40 MG) BY MOUTH DAILY ?Patient not taking: Reported on 12/20/2021 06/21/21   Delma Freeze, FNP  ?losartan (COZAAR) 25 MG tablet TAKE 1 TABLET(25 MG) BY MOUTH DAILY ?Patient not taking: Reported on 12/20/2021 06/21/21   Delma Freeze, FNP  ?potassium chloride (KLOR-CON) 10 MEQ tablet Take 1 tablet (10 mEq total) by mouth daily. ?Patient not taking: Reported on 12/20/2021 06/21/21   Delma Freeze, FNP  ? ? ?Review of Systems  ?Constitutional:  Negative for appetite change.  ?HENT:  Negative for congestion, postnasal drip and sore throat.   ?  Eyes: Negative.   ?Respiratory:  Positive for shortness of breath. Negative for cough and chest tightness.   ?Cardiovascular:  Positive for leg swelling (improving). Negative for chest pain.  ?Gastrointestinal:  Negative for abdominal distention.  ?Endocrine: Negative.   ?Genitourinary: Negative.   ?Musculoskeletal:  Positive for neck pain. Negative for back pain.  ?Skin: Negative.   ?Allergic/Immunologic: Negative.   ?Neurological:  Negative for dizziness and light-headedness.  ?Hematological:  Negative for adenopathy. Does not bruise/bleed easily.  ?Psychiatric/Behavioral:  Negative for dysphoric mood and sleep disturbance (sleeping on 1 pillow). The patient is nervous/anxious.   ? ?Vitals:  ? 12/20/21 1050  ?BP: (!) 155/92  ?Pulse: (!) 42  ?Resp: 18  ?SpO2: 100%  ?Weight: 239 lb 6 oz (108.6 kg)  ?Height: 6\' 3"  (1.905 m)  ? ?Wt Readings from Last 3 Encounters:   ?12/20/21 239 lb 6 oz (108.6 kg)  ?06/21/21 232 lb 8 oz (105.5 kg)  ?06/13/21 235 lb (106.6 kg)  ? ?Lab Results  ?Component Value Date  ? CREATININE 1.00 06/13/2021  ? CREATININE 0.98 02/28/2021  ? CREATININE 1.37 (H) 11/30/2020  ? ?Physical Exam ?Vitals and nursing note reviewed. Exam conducted with a chaperone present (sister).  ?Constitutional:   ?   Appearance: Normal appearance.  ?HENT:  ?   Head: Normocephalic and atraumatic.  ?Cardiovascular:  ?   Rate and Rhythm: Regular rhythm. Bradycardia present.  ?Pulmonary:  ?   Effort: Pulmonary effort is normal. No respiratory distress.  ?   Breath sounds: No wheezing or rales.  ?Abdominal:  ?   General: Abdomen is flat. There is no distension.  ?   Palpations: Abdomen is soft.  ?Musculoskeletal:     ?   General: Deformity (right arm amputation at elbow) present. No tenderness.  ?   Cervical back: Normal range of motion and neck supple.  ?   Right lower leg: Edema (1+ pitting) present.  ?   Left lower leg: Edema (trace pitting) present.  ?Skin: ?   General: Skin is warm and dry.  ?Neurological:  ?   General: No focal deficit present.  ?   Mental Status: He is alert and oriented to person, place, and time.  ?Psychiatric:     ?   Mood and Affect: Mood is anxious.     ?   Speech: Speech is rapid and pressured.     ?   Behavior: Behavior normal.  ? ?Assessment & Plan: ? ?1: Chronic heart failure with reduced ejection fraction- ?- NYHA class II ?- euvolemic today ?- says that he's weighing daily but unsure if that's accurate; reminded to call for an overnight weight gain of > 2 pounds or a weekly weight gain of > 5 pounds ?- weight up 7 pounds from last visit here 6 months ago ?- not adding salt but "just a pinch" when eating potatoes, eggs or cube steak. Reinforced the importance of not adding salt to his food and reading food labels for sodium content ?- already rinses canned vegetables ?- on GDMT of metoprolol although taking it every other day; told him to continue  this way as he is bradycardic today ?- will resume losartan today as he doesn't know why he's not taking it anymore; is not wanting to take very many medications and I'm not sure how compliant he can be with multi-dose medications like entresto ?- BNP 11/27/20 was 141.1 ? ?2: HTN- ?- BP mildly elevated (155/92); starting losartan per above ?- saw PCP 11/29/20) 06/13/21;  returns 05/2022 ?- BMP 06/13/21 reviewed and showed sodium 134, potassium 4.1, creatinine 1.00 and GFR >60 ? ?3: Tobacco use- ?- smoking 2 cigarettes but is trying to quit because when he smokes "the ball in the chest disappears" ?- complete cessation discussed for 3 minutes with him ? ? ?Medication bottle reviewed.  ? ?Return in 1 month, sooner if needed.  ? ? ? ? ?

## 2021-12-20 NOTE — Patient Instructions (Signed)
Continue weighing daily and call for an overnight weight gain of 3 pounds or more or a weekly weight gain of more than 5 pounds.   If you have voicemail, please make sure your mailbox is cleaned out so that we may leave a message and please make sure to listen to any voicemails.     

## 2022-01-11 ENCOUNTER — Telehealth: Payer: Self-pay | Admitting: Family Medicine

## 2022-01-11 NOTE — Telephone Encounter (Signed)
Copied from CRM [id number]. Topic: Medicare AWV ?>> Jan 11, 2022  3:13 PM Claudette Laws R wrote: ?Reason for CRM:  ?No answer unable to leave a  message for patient to call back and schedule Medicare Annual Wellness Visit (AWV) in office.  ? ?If unable to come into the office for AWV,  please offer to do virtually or by telephone. ? ?No hx of AWV eligible for AWVI per [employer] as of 09/16/2009 ? ?Please schedule at anytime with [facility] Advisor.     ? ?45 minute appointment  ? ?Any questions, please call me at [phone] ?

## 2022-01-22 NOTE — Progress Notes (Signed)
? Patient ID: Scott Meyer, male    DOB: 02-Mar-1951, 71 y.o.   MRN: [id number] ? ?HPI ? ?Scott Meyer is a 71 y/o male with a history of HTN, previous tobacco use and chronic heart failure.  ? ?Echo report from 11/28/20 reviewed and showed an EF of 25-30% along with moderate LVH.  ? ?Hasn't been admitted or been in the ED in the last 6 months.  ? ?He presents today for a follow-up visit with a chief complaint of moderate fatigue upon minimal exertion. Describes this as chronic in nature. Has associated shortness of breath, pedal edema, light-headedness and anxiety along with this. Denies any difficulty sleeping, abdominal distention, palpitations, chest pain, cough or weight gain.  ? ?Says that he takes the metoprolol every other day. Has not been taking his furosemide and also has not picked up his losartan from the pharmacy.  ? ?Says that he's using less salt but still adds it at times.  ? ?He still says that there's a "ball in my chest that travels across the chest". He says that he knows the ball is helping his heart and he wants to keep it. Says that he feels better knowing this ball is in his chest.  ? ?Past Medical History:  ?Diagnosis Date  ? Cardiomyopathy (HCC)   ? a. 11/2020 Echo: EF 25-30%, mod LVH, Gr1 DD. Mild AoV Ca2+ w/ mild to mod AoV sclerosis.  ? CHF (congestive heart failure) (HCC)   ? Hypertension   ? ?Past Surgical History:  ?Procedure Laterality Date  ? ARM AMPUTATION AT ELBOW Right   ? KNEE SURGERY    ? ?Family History  ?Problem Relation Age of Onset  ? Diabetes Mother   ? Cirrhosis Father   ? Alcohol abuse Father   ? Other Brother   ?     gsw  ? Cancer Brother   ?     'head full of tumors'  ? Other Brother   ?     'killed w/ 2x4 to the head'  ? Other Sister   ?     mva  ? ?Social History  ? ?Tobacco Use  ? Smoking status: Former  ?  Types: Cigarettes  ?  Quit date: 10/09/2017  ?  Years since quitting: 4.2  ? Smokeless tobacco: Never  ?Substance Use Topics  ? Alcohol use: No  ?  Comment: quit 8  months ago  ? ?No Known Allergies ? ?Prior to Admission medications   ?Medication Sig Start Date End Date Taking? Authorizing Provider  ?metoprolol succinate (TOPROL-XL) 25 MG 24 hr tablet TAKE 1 TABLET(25 MG) BY MOUTH DAILY ?Patient taking differently: 25 mg every other day. 06/21/21  Yes Delma Freeze, FNP  ?atorvastatin (LIPITOR) 40 MG tablet TAKE 1 TABLET(40 MG) BY MOUTH DAILY ?Patient not taking: Reported on 12/20/2021 06/21/21   Delma Freeze, FNP  ?furosemide (LASIX) 40 MG tablet TAKE 1 TABLET(40 MG) BY MOUTH DAILY ?Patient not taking: Reported on 12/20/2021 06/21/21   Delma Freeze, FNP  ?losartan (COZAAR) 25 MG tablet TAKE 1 TABLET(25 MG) BY MOUTH DAILY ?Patient not taking: Reported on 01/23/2022 12/20/21   Delma Freeze, FNP  ?nitroGLYCERIN (NITROSTAT) 0.4 MG SL tablet Place 1 tablet (0.4 mg total) under the tongue every 5 (five) minutes as needed for chest pain. ?Patient not taking: Reported on 01/23/2022 11/30/20   Calvert Cantor, MD  ?potassium chloride (KLOR-CON) 10 MEQ tablet Take 1 tablet (10 mEq total) by mouth daily. ?Patient not taking:  Reported on 12/20/2021 06/21/21   Delma Freeze, FNP  ? ?Review of Systems  ?Constitutional:  Positive for fatigue. Negative for appetite change.  ?HENT:  Negative for congestion, postnasal drip and sore throat.   ?Eyes: Negative.   ?Respiratory:  Positive for shortness of breath. Negative for cough and chest tightness.   ?Cardiovascular:  Positive for leg swelling. Negative for chest pain and palpitations.  ?Gastrointestinal:  Negative for abdominal distention and abdominal pain.  ?Endocrine: Negative.   ?Genitourinary: Negative.   ?Musculoskeletal:  Positive for neck pain. Negative for back pain.  ?Skin: Negative.   ?Allergic/Immunologic: Negative.   ?Neurological:  Positive for light-headedness (at times). Negative for dizziness.  ?Hematological:  Negative for adenopathy. Does not bruise/bleed easily.  ?Psychiatric/Behavioral:  Negative for dysphoric mood and sleep  disturbance (sleeping on 1 pillow). The patient is nervous/anxious.   ? ?Vitals:  ? 01/23/22 1036  ?BP: (!) 157/85  ?Pulse: 69  ?Resp: 16  ?SpO2: 95%  ?Weight: 240 lb 4 oz (109 kg)  ? ?Wt Readings from Last 3 Encounters:  ?01/23/22 240 lb 4 oz (109 kg)  ?12/20/21 239 lb 6 oz (108.6 kg)  ?06/21/21 232 lb 8 oz (105.5 kg)  ? ?Lab Results  ?Component Value Date  ? CREATININE 1.00 06/13/2021  ? CREATININE 0.98 02/28/2021  ? CREATININE 1.37 (H) 11/30/2020  ? ?Physical Exam ?Vitals and nursing note reviewed.  ?Constitutional:   ?   Appearance: Normal appearance.  ?HENT:  ?   Head: Normocephalic and atraumatic.  ?Cardiovascular:  ?   Rate and Rhythm: Regular rhythm. Bradycardia present.  ?Pulmonary:  ?   Effort: Pulmonary effort is normal. No respiratory distress.  ?   Breath sounds: No wheezing or rales.  ?Abdominal:  ?   General: Abdomen is flat. There is no distension.  ?   Palpations: Abdomen is soft.  ?Musculoskeletal:     ?   General: Deformity (right arm amputation at elbow) present. No tenderness.  ?   Cervical back: Normal range of motion and neck supple.  ?   Right lower leg: Edema (2+ pitting) present.  ?   Left lower leg: Edema (1+ pitting) present.  ?Skin: ?   General: Skin is warm and dry.  ?Neurological:  ?   General: No focal deficit present.  ?   Mental Status: He is alert and oriented to person, place, and time.  ?Psychiatric:     ?   Mood and Affect: Mood is anxious.     ?   Speech: Speech is rapid and pressured.     ?   Behavior: Behavior normal.  ? ?Assessment & Plan: ? ?1: Chronic heart failure with reduced ejection fraction- ?- NYHA class II ?- euvolemic today ?- says that he's weighing daily but unsure if that's accurate; reminded to call for an overnight weight gain of > 2 pounds or a weekly weight gain of > 5 pounds ?- weight stable from last visit here 1 month ago ?- not adding salt but "just a pinch" when eating potatoes, eggs or cube steak. Reinforced the importance of not adding salt to his  food and reading food labels for sodium content ?- already rinses canned vegetables ?- on GDMT of metoprolol although still taking it every other day ?- has not been taking furosemide (unsure of why) and did not pick up losartan ?- put on his AVS to take his furosemide daily and pick up the losartan at the pharmacy ?- will not check labs  today since he didn't start the losartan ?- plan to check labs next visit ?- BNP 11/27/20 was 141.1 ? ?2: HTN- ?- BP mildly elevated (157/85); resuming meds per above ?- saw PCP Yetta Barre) 06/13/21; returns 05/2022 ?- BMP 06/13/21 reviewed and showed sodium 134, potassium 4.1, creatinine 1.00 and GFR >60 ? ?3: Tobacco use- ?- smoking 2 cigarettes but is trying to quit because when he smokes "the ball in the chest disappears" ?- complete cessation discussed for 3 minutes with him ? ? ?Patient did not bring his medications nor a list. Each medication was verbally reviewed with the patient and he was encouraged to bring the bottles to every visit to confirm accuracy of list.   ? ?Return in 2 months, sooner if needed.  ? ? ? ? ? ?

## 2022-01-23 ENCOUNTER — Ambulatory Visit: Payer: Medicare Other | Attending: Family | Admitting: Family

## 2022-01-23 ENCOUNTER — Encounter: Payer: Self-pay | Admitting: Family

## 2022-01-23 VITALS — BP 157/85 | HR 69 | Resp 16 | Wt 240.2 lb

## 2022-01-23 DIAGNOSIS — T465X6A Underdosing of other antihypertensive drugs, initial encounter: Secondary | ICD-10-CM | POA: Insufficient documentation

## 2022-01-23 DIAGNOSIS — F17210 Nicotine dependence, cigarettes, uncomplicated: Secondary | ICD-10-CM | POA: Insufficient documentation

## 2022-01-23 DIAGNOSIS — Z79899 Other long term (current) drug therapy: Secondary | ICD-10-CM | POA: Diagnosis not present

## 2022-01-23 DIAGNOSIS — I10 Essential (primary) hypertension: Secondary | ICD-10-CM | POA: Diagnosis not present

## 2022-01-23 DIAGNOSIS — I110 Hypertensive heart disease with heart failure: Secondary | ICD-10-CM | POA: Insufficient documentation

## 2022-01-23 DIAGNOSIS — Z720 Tobacco use: Secondary | ICD-10-CM

## 2022-01-23 DIAGNOSIS — I5022 Chronic systolic (congestive) heart failure: Secondary | ICD-10-CM | POA: Diagnosis present

## 2022-01-23 NOTE — Patient Instructions (Addendum)
Continue weighing daily and call for an overnight weight gain of 3 pounds or more or a weekly weight gain of more than 5 pounds. ? ? ?If you have voicemail, please make sure your mailbox is cleaned out so that we may leave a message and please make sure to listen to any voicemails.  ? ? ? ?Pick up your losartan at the pharmacy and also start back taking your fluid pill every day ? ? ?Bring ALL medications including any over the counter or vitamins to every visit.  ? ?

## 2022-03-23 NOTE — Progress Notes (Deleted)
Patient ID: Scott Meyer, male    DOB: [birth year], 71 y.o.   MRN: [id number]  HPI  Scott Meyer is a 71 y/o male with a history of HTN, previous tobacco use and chronic heart failure.   Echo report from 11/28/20 reviewed and showed an EF of 25-30% along with moderate LVH.   Hasn't been admitted or been in the ED in the last 6 months.   He presents today for a follow-up visit with a chief complaint of   Past Medical History:  Diagnosis Date   Cardiomyopathy (HCC)    a. 11/2020 Echo: EF 25-30%, mod LVH, Gr1 DD. Mild AoV Ca2+ w/ mild to mod AoV sclerosis.   CHF (congestive heart failure) (HCC)    Hypertension    Past Surgical History:  Procedure Laterality Date   ARM AMPUTATION AT ELBOW Right    KNEE SURGERY     Family History  Problem Relation Age of Onset   Diabetes Mother    Cirrhosis Father    Alcohol abuse Father    Other Brother        gsw   Cancer Brother        'head full of tumors'   Other Brother        'killed w/ 2x4 to the head'   Other Sister        mva   Social History   Tobacco Use   Smoking status: Former    Types: Cigarettes    Quit date: 10/09/2017    Years since quitting: 4.4   Smokeless tobacco: Never  Substance Use Topics   Alcohol use: No    Comment: quit 8 months ago   No Known Allergies   Review of Systems  Constitutional:  Positive for fatigue. Negative for appetite change.  HENT:  Negative for congestion, postnasal drip and sore throat.   Eyes: Negative.   Respiratory:  Positive for shortness of breath. Negative for cough and chest tightness.   Cardiovascular:  Positive for leg swelling. Negative for chest pain and palpitations.  Gastrointestinal:  Negative for abdominal distention and abdominal pain.  Endocrine: Negative.   Genitourinary: Negative.   Musculoskeletal:  Positive for neck pain. Negative for back pain.  Skin: Negative.   Allergic/Immunologic: Negative.   Neurological:  Positive for light-headedness (at times). Negative  for dizziness.  Hematological:  Negative for adenopathy. Does not bruise/bleed easily.  Psychiatric/Behavioral:  Negative for dysphoric mood and sleep disturbance (sleeping on 1 pillow). The patient is nervous/anxious.       Physical Exam Vitals and nursing note reviewed.  Constitutional:      Appearance: Normal appearance.  HENT:     Head: Normocephalic and atraumatic.  Cardiovascular:     Rate and Rhythm: Regular rhythm. Bradycardia present.  Pulmonary:     Effort: Pulmonary effort is normal. No respiratory distress.     Breath sounds: No wheezing or rales.  Abdominal:     General: Abdomen is flat. There is no distension.     Palpations: Abdomen is soft.  Musculoskeletal:        General: Deformity (right arm amputation at elbow) present. No tenderness.     Cervical back: Normal range of motion and neck supple.     Right lower leg: Edema (2+ pitting) present.     Left lower leg: Edema (1+ pitting) present.  Skin:    General: Skin is warm and dry.  Neurological:     General: No focal deficit present.  Mental Status: He is alert and oriented to person, place, and time.  Psychiatric:        Mood and Affect: Mood is anxious.        Speech: Speech is rapid and pressured.        Behavior: Behavior normal.    Assessment & Plan:  1: Chronic heart failure with reduced ejection fraction- - NYHA class II - euvolemic today - says that he's weighing daily but unsure if that's accurate; reminded to call for an overnight weight gain of > 2 pounds or a weekly weight gain of > 5 pounds - weight stable from last visit here 1 month ago - not adding salt but "just a pinch" when eating potatoes, eggs or cube steak. Reinforced the importance of not adding salt to his food and reading food labels for sodium content - already rinses canned vegetables - on GDMT of metoprolol although still taking it every other day - plan to check labs today  - BNP 11/27/20 was 141.1  2: HTN- - BP  -  saw PCP Scott Meyer) 06/13/21; returns 05/2022 - BMP 06/13/21 reviewed and showed sodium 134, potassium 4.1, creatinine 1.00 and GFR >60  3: Tobacco use- - smoking 2 cigarettes but is trying to quit because when he smokes "the ball in the chest disappears" - complete cessation discussed for 3 minutes with him   Patient did not bring his medications nor a list. Each medication was verbally reviewed with the patient and he was encouraged to bring the bottles to every visit to confirm accuracy of list.

## 2022-03-25 ENCOUNTER — Ambulatory Visit: Payer: Medicare Other | Admitting: Family

## 2022-03-25 ENCOUNTER — Telehealth: Payer: Self-pay | Admitting: Family

## 2022-03-25 NOTE — Telephone Encounter (Signed)
Patient did not show for his [facility] appointment on 03/25/22. Will attempt to reschedule.   

## 2022-04-01 ENCOUNTER — Ambulatory Visit: Payer: Medicare Other

## 2022-04-15 ENCOUNTER — Ambulatory Visit: Payer: Medicare Other | Admitting: Nurse Practitioner

## 2022-04-16 ENCOUNTER — Encounter: Payer: Self-pay | Admitting: Family

## 2022-04-16 ENCOUNTER — Ambulatory Visit (HOSPITAL_BASED_OUTPATIENT_CLINIC_OR_DEPARTMENT_OTHER): Payer: Medicare Other | Admitting: Family

## 2022-04-16 ENCOUNTER — Other Ambulatory Visit
Admission: RE | Admit: 2022-04-16 | Discharge: 2022-04-16 | Disposition: A | Discharge status: Home / Self Care | Payer: Medicare Other | Source: Ambulatory Visit | Attending: Family | Admitting: Family

## 2022-04-16 VITALS — BP 138/72 | HR 70 | Resp 14 | Ht 75.0 in | Wt 231.1 lb

## 2022-04-16 DIAGNOSIS — Z720 Tobacco use: Secondary | ICD-10-CM | POA: Diagnosis not present

## 2022-04-16 DIAGNOSIS — I10 Essential (primary) hypertension: Secondary | ICD-10-CM

## 2022-04-16 DIAGNOSIS — I5022 Chronic systolic (congestive) heart failure: Secondary | ICD-10-CM | POA: Insufficient documentation

## 2022-04-16 LAB — BASIC METABOLIC PANEL
Anion gap: 5 (ref 5–15)
BUN: 18 mg/dL (ref 8–23)
CO2: 23 mmol/L (ref 22–32)
Calcium: 9.3 mg/dL (ref 8.9–10.3)
Chloride: 111 mmol/L (ref 98–111)
Creatinine, Ser: 0.99 mg/dL (ref 0.61–1.24)
GFR, Estimated: >60 mL/min (ref >60)
Glucose, Bld: 97 mg/dL (ref 70–99)
Potassium: 3.7 mmol/L (ref 3.5–5.1)
Sodium: 139 mmol/L (ref 135–145)

## 2022-04-16 NOTE — Progress Notes (Signed)
Patient ID: Scott Meyer, male    DOB: 12-01-1950, 71 y.o.   MRN: [id number]  HPI  Scott Meyer is Meyer 71 y/o male with Meyer history of HTN, previous tobacco use and chronic heart failure.   Echo report from 11/28/20 reviewed and showed an EF of 25-30% along with moderate LVH.   Hasn't been admitted or been in the ED in the last 6 months.   He presents today for Meyer follow-up visit with Meyer chief complaint of minimal fatigue with moderate exertion. Describes this as chronic in nature. Has associated shortness of breath, pedal edema, palpitations, light-headedness, neck pain and anxiety along with this. He denies any difficult sleeping, abdominal distention, chest pain, cough or weight gain.   Has inadvertently been taking 2 different colored losartan tablets because he thought they were different. He says that he has great difficulty in reading/ comprehension.   Has only been taking his furosemide PRN because when he takes it, it makes his urine burn.   Past Medical History:  Diagnosis Date   Cardiomyopathy (HCC)    Meyer. 11/2020 Echo: EF 25-30%, mod LVH, Gr1 DD. Mild AoV Ca2+ w/ mild to mod AoV sclerosis.   CHF (congestive heart failure) (HCC)    Hypertension    Past Surgical History:  Procedure Laterality Date   ARM AMPUTATION AT ELBOW Right    KNEE SURGERY     Family History  Problem Relation Age of Onset   Diabetes Mother    Cirrhosis Father    Alcohol abuse Father    Other Brother        gsw   Cancer Brother        'head full of tumors'   Other Brother        'killed w/ 2x4 to the head'   Other Sister        mva   Social History   Tobacco Use   Smoking status: Former    Types: Cigarettes    Quit date: 10/09/2017    Years since quitting: 4.5   Smokeless tobacco: Never  Substance Use Topics   Alcohol use: No    Comment: quit 8 months ago   No Known Allergies  Prior to Admission medications   Medication Sig Start Date End Date Taking? Authorizing Provider  furosemide  (LASIX) 40 MG tablet TAKE 1 TABLET(40 MG) BY MOUTH DAILY Patient taking differently: Take 40 mg by mouth daily. Takes one tablet every few days 06/21/21  Yes Scott Meyer  losartan (COZAAR) 25 MG tablet TAKE 1 TABLET(25 MG) BY MOUTH DAILY 12/20/21  Yes Scott Meyer  atorvastatin (LIPITOR) 40 MG tablet TAKE 1 TABLET(40 MG) BY MOUTH DAILY Patient not taking: Reported on 12/20/2021 06/21/21   Scott Meyer  metoprolol succinate (TOPROL-XL) 25 MG 24 hr tablet TAKE 1 TABLET(25 MG) BY MOUTH DAILY Patient not taking: Reported on 04/16/2022 06/21/21   Scott Meyer  nitroGLYCERIN (NITROSTAT) 0.4 MG SL tablet Place 1 tablet (0.4 mg total) under the tongue every 5 (five) minutes as needed for chest pain. Patient not taking: Reported on 01/23/2022 11/30/20   Scott Meyer  potassium chloride (KLOR-CON) 10 MEQ tablet Take 1 tablet (10 mEq total) by mouth daily. Patient not taking: Reported on 12/20/2021 06/21/21   Scott Meyer    Review of Systems  Constitutional:  Positive for fatigue. Negative for appetite change.  HENT:  Negative for congestion, postnasal drip and sore throat.  Eyes: Negative.   Respiratory:  Positive for shortness of breath. Negative for cough and chest tightness.   Cardiovascular:  Positive for palpitations (sometimes) and leg swelling (right ankle > L). Negative for chest pain.  Gastrointestinal:  Negative for abdominal distention and abdominal pain.  Endocrine: Negative.   Genitourinary: Negative.   Musculoskeletal:  Positive for neck pain. Negative for back pain.  Skin: Negative.   Allergic/Immunologic: Negative.   Neurological:  Positive for light-headedness (at times). Negative for dizziness.  Hematological:  Negative for adenopathy. Does not bruise/bleed easily.  Psychiatric/Behavioral:  Negative for dysphoric mood and sleep disturbance (sleeping on 1 pillow). The patient is nervous/anxious.    Vitals:   04/16/22 1259  BP: 138/72  Pulse: 70   Resp: 14  SpO2: 98%  Weight: 231 lb 2 oz (104.8 kg)  Height: 6\' 3"  (1.905 m)   Wt Readings from Last 3 Encounters:  04/16/22 231 lb 2 oz (104.8 kg)  01/23/22 240 lb 4 oz (109 kg)  12/20/21 239 lb 6 oz (108.6 kg)   Lab Results  Component Value Date   CREATININE 0.99 04/16/2022   CREATININE 1.00 06/13/2021   CREATININE 0.98 02/28/2021   Physical Exam Vitals and nursing note reviewed.  Constitutional:      Appearance: Normal appearance.  HENT:     Head: Normocephalic and atraumatic.  Cardiovascular:     Rate and Rhythm: Normal rate and regular rhythm.  Pulmonary:     Effort: Pulmonary effort is normal. No respiratory distress.     Breath sounds: No wheezing or rales.  Abdominal:     General: Abdomen is flat. There is no distension.     Palpations: Abdomen is soft.  Musculoskeletal:        General: Deformity (right arm amputation at elbow) present. No tenderness.     Cervical back: Normal range of motion and neck supple.     Right lower leg: Edema (2+ pitting) present.     Left lower leg: Edema (1+ pitting) present.  Skin:    General: Skin is warm and dry.  Neurological:     General: No focal deficit present.     Mental Status: He is alert and oriented to person, place, and time.  Psychiatric:        Mood and Affect: Mood is anxious.        Speech: Speech is rapid and pressured.        Behavior: Behavior normal.    Assessment & Plan:  1: Chronic heart failure with reduced ejection fraction- - NYHA class II - euvolemic today - says that he's weighing daily; reminded to call for an overnight weight gain of > 2 pounds or Meyer weekly weight gain of > 5 pounds - weight down 9 pounds from last visit here 2.5 months ago - not adding salt but "just Meyer pinch" when eating potatoes, eggs or cube steak. Reinforced the importance of not adding salt to his food and reading food labels for sodium content although he says that he can't read very well - already rinses canned  vegetables - on GDMT of losartan and not metoprolol but he's unsure of why - he will take his furosemide as 1 tablet every other day - consider paramedicine referral for assistance in the home - check labs today  - BNP 11/27/20 was 141.1  2: HTN- - BP mildly elevated (138/72); to take furosemide every other day - saw PCP 11/29/20) 06/13/21; returns 05/2022 - BMP 06/13/21 reviewed and showed  sodium 134, potassium 4.1, creatinine 1.00 and GFR >60  3: Tobacco use- - says that he hasn't smoked any in ~ 1 year - no alcohol since longer than that   Medication bottles reviewed.   Return in 6 months, sooner if needed.

## 2022-04-16 NOTE — Patient Instructions (Addendum)
Continue weighing daily and call for an overnight weight gain of 3 pounds or more or a weekly weight gain of more than 5 pounds.   If you have voicemail, please make sure your mailbox is cleaned out so that we may leave a message and please make sure to listen to any voicemails.    Take your fluid pill (pill with the dots on it) every other day

## 2022-05-01 ENCOUNTER — Telehealth (HOSPITAL_COMMUNITY): Payer: Self-pay

## 2022-05-01 NOTE — Telephone Encounter (Signed)
Was sent a referral from [facility].  Contacted him today and made a home appt for Aug 23 at 12:00.    Earmon Phoenix Leon EMT-Paramedic [phone]

## 2022-05-24 ENCOUNTER — Ambulatory Visit (INDEPENDENT_AMBULATORY_CARE_PROVIDER_SITE_OTHER): Payer: Medicare Other

## 2022-05-24 DIAGNOSIS — Z0000 Encounter for general adult medical examination without abnormal findings: Secondary | ICD-10-CM | POA: Diagnosis not present

## 2022-05-24 DIAGNOSIS — Z23 Encounter for immunization: Secondary | ICD-10-CM | POA: Diagnosis not present

## 2022-05-24 NOTE — Progress Notes (Signed)
I connected with  Scott Meyer on 05/24/22 by a audio enabled telemedicine application and verified that I am speaking with the correct person using two identifiers.  Patient Location: Home  Provider Location: Office/Clinic  I discussed the limitations of evaluation and management by telemedicine. The patient expressed understanding and agreed to proceed.   Subjective:   Scott Meyer is a 71 y.o. male who presents for Medicare Annual/Subsequent preventive examination.  Review of Systems    Per HPI unless specifically indicated below.  Cardiac Risk Factors include: advanced age (>70men, >16 women);male gender, hypertension, and hyperlipidemia.          Objective:    Today's Vitals   05/24/22 1132  PainSc: 1    There is no height or weight on file to calculate BMI.     11/28/2020   11:21 AM 11/27/2020    5:51 PM 04/08/2019   12:07 PM 06/21/2018   10:16 AM 06/01/2017   10:35 AM  Advanced Directives  Does Patient Have a Medical Advance Directive? No No No No No  Would patient like information on creating a medical advance directive? No - Patient declined No - Patient declined  No - Patient declined     Current Medications (verified) Outpatient Encounter Medications as of 05/24/2022  Medication Sig   furosemide (LASIX) 40 MG tablet TAKE 1 TABLET(40 MG) BY MOUTH DAILY (Patient taking differently: Take 40 mg by mouth daily. Takes one tablet every few days)   losartan (COZAAR) 25 MG tablet TAKE 1 TABLET(25 MG) BY MOUTH DAILY   atorvastatin (LIPITOR) 40 MG tablet TAKE 1 TABLET(40 MG) BY MOUTH DAILY (Patient not taking: Reported on 05/24/2022)   metoprolol succinate (TOPROL-XL) 25 MG 24 hr tablet TAKE 1 TABLET(25 MG) BY MOUTH DAILY (Patient not taking: Reported on 04/16/2022)   nitroGLYCERIN (NITROSTAT) 0.4 MG SL tablet Place 1 tablet (0.4 mg total) under the tongue every 5 (five) minutes as needed for chest pain. (Patient not taking: Reported on 01/23/2022)   potassium chloride  (KLOR-CON) 10 MEQ tablet Take 1 tablet (10 mEq total) by mouth daily. (Patient not taking: Reported on 05/24/2022)   [DISCONTINUED] lisinopril (PRINIVIL,ZESTRIL) 10 MG tablet Take 1 tablet (10 mg total) by mouth daily.   No facility-administered encounter medications on file as of 05/24/2022.    Allergies (verified) Patient has no known allergies.   History: Past Medical History:  Diagnosis Date   Cardiomyopathy (HCC)    a. 11/2020 Echo: EF 25-30%, mod LVH, Gr1 DD. Mild AoV Ca2+ w/ mild to mod AoV sclerosis.   CHF (congestive heart failure) (HCC)    Hypertension    Past Surgical History:  Procedure Laterality Date   ARM AMPUTATION AT ELBOW Right    KNEE SURGERY     Family History  Problem Relation Age of Onset   Diabetes Mother    Cirrhosis Father    Alcohol abuse Father    Other Brother        gsw   Cancer Brother        'head full of tumors'   Other Brother        'killed w/ 2x4 to the head'   Other Sister        mva   Social History   Socioeconomic History   Marital status: Single    Spouse name: Not on file   Number of children: Not on file   Years of education: Not on file   Highest education level: Not on file  Occupational History   Occupation: Retired  Tobacco Use   Smoking status: Former    Types: Cigarettes    Quit date: 10/09/2017    Years since quitting: 4.6   Smokeless tobacco: Never  Vaping Use   Vaping Use: Never used  Substance and Sexual Activity   Alcohol use: No    Comment: quit 8 months ago   Drug use: No   Sexual activity: Not on file  Other Topics Concern   Not on file  Social History Narrative   Lives in [place] w/ his sister.  Does not routinely exercise.   Social Determinants of Health   Financial Resource Strain: Low Risk  (05/24/2022)   Overall Financial Resource Strain (CARDIA)    Difficulty of Paying Living Expenses: Not hard at all  Food Insecurity: No Food Insecurity (05/24/2022)   Hunger Vital Sign    Worried About Running  Out of Food in the Last Year: Never true    Ran Out of Food in the Last Year: Never true  Transportation Needs: No Transportation Needs (05/24/2022)   PRAPARE - Administrator, Civil Service (Medical): No    Lack of Transportation (Non-Medical): No  Physical Activity: Inactive (05/24/2022)   Exercise Vital Sign    Days of Exercise per Week: 0 days    Minutes of Exercise per Session: 0 min  Stress: No Stress Concern Present (05/24/2022)   [employer] of Occupational Health - Occupational Stress Questionnaire    Feeling of Stress : Only a little  Social Connections: Socially Isolated (05/24/2022)   Social Connection and Isolation Panel [NHANES]    Frequency of Communication with Friends and Family: Never    Frequency of Social Gatherings with Friends and Family: Never    Attends Religious Services: Never    Database administrator or Organizations: No    Attends Banker Meetings: Never    Marital Status: Never married    Tobacco Counseling No counseling needed.  Clinical Intake:  Pre-visit preparation completed: No  Pain : 0-10 Pain Score: 1  Pain Type: Acute pain Pain Location: Shoulder Pain Orientation: Left Pain Descriptors / Indicators: Aching Pain Onset: Yesterday Pain Frequency: Intermittent     Nutritional Status: BMI > 30  Obese Nutritional Risks: None Diabetes: No     Diabetic?no  Interpreter Needed?: No  Information entered by :: Laurel Dimmer, CMA   Activities of Daily Living    05/24/2022   11:28 AM 04/16/2022    1:09 PM  In your present state of health, do you have any difficulty performing the following activities:  Hearing? 1 0  Vision? 1 1  Difficulty concentrating or making decisions? 0 1  Walking or climbing stairs? 0 1  Dressing or bathing? 0 1  Doing errands, shopping?  0    Patient Care Team: Duanne Limerick, MD as PCP - General (Family Medicine) End, Cristal Deer, MD as PCP - Cardiology (Cardiology)  Indicate  any recent Medical Services you may have received from other than [facility] providers in the past year (date may be approximate). No hospital in the past 12 months.     Assessment:   This is a routine wellness examination for Scott Meyer.  Hearing/Vision screen Admits he has some hearing issues in his Rt ear, from working in a [employer]. He declines an referral to audiologist. Annual Eye Exam, Wear glasses.  Dietary issues and exercise activities discussed: Current Exercise Habits: The patient does not participate in regular exercise  at present, Exercise limited by: None identified   Goals Addressed             This Visit's Progress    Stay Active and Independent       Why is this important?   Regular activity or exercise is important to managing back pain.  Activity helps to keep your muscles strong.  You will sleep better and feel more relaxed.  You will have more energy and feel less stressed.  If you are not active now, start slowly. Little changes make a big difference.  Rest, but not too much.  Stay as active as you can and listen to your body's signals.           Depression Screen    05/24/2022   11:25 AM 04/16/2022    1:09 PM 01/23/2022   10:17 AM 06/21/2021   12:22 PM 04/11/2021   10:38 AM 12/01/2020    2:44 PM 06/16/2019    9:54 AM  PHQ 2/9 Scores  PHQ - 2 Score 0 0 0 1 0 0 0  PHQ- 9 Score       0    Fall Risk    05/24/2022   11:28 AM 04/16/2022    1:07 PM 01/23/2022   10:14 AM 12/20/2021   10:48 AM 06/21/2021   12:22 PM  Fall Risk   Falls in the past year? 0 0 0 0 0  Number falls in past yr: 0 0 0 0 0  Injury with Fall? 0 0 0 0 0  Risk for fall due to : No Fall Risks Impaired balance/gait Impaired mobility    Follow up Falls evaluation completed Falls evaluation completed;Falls prevention discussed Falls prevention discussed Falls evaluation completed Falls evaluation completed;Falls prevention discussed    FALL RISK PREVENTION PERTAINING TO THE HOME:  Any stairs  in or around the home? No  If so, are there any without handrails? No  Home free of loose throw rugs in walkways, pet beds, electrical cords, etc? Yes  Adequate lighting in your home to reduce risk of falls? Yes   ASSISTIVE DEVICES UTILIZED TO PREVENT FALLS:  Life alert? No  Use of a cane, walker or w/c? No  Grab bars in the bathroom? Yes  Shower chair or bench in shower? Yes  Elevated toilet seat or a handicapped toilet? Yes   TIMED UP AND GO:  Was the test performed? No .    Cognitive Function:        05/24/2022   11:31 AM  6CIT Screen  What Year? 0 points  What month? 0 points  What time? 0 points  Count back from 20 0 points  Months in reverse 0 points  Repeat phrase 2 points  Total Score 2 points    Immunizations Immunization History  Administered Date(s) Administered   Fluad Quad(high Dose 65+) 06/16/2019    TDAP status: Due, Education has been provided regarding the importance of this vaccine. Advised may receive this vaccine at local pharmacy or Health Dept. Aware to provide a copy of the vaccination record if obtained from local pharmacy or Health Dept. Verbalized acceptance and understanding.  Flu Vaccine status: Due, Education has been provided regarding the importance of this vaccine. Advised may receive this vaccine at local pharmacy or Health Dept. Aware to provide a copy of the vaccination record if obtained from local pharmacy or Health Dept. Verbalized acceptance and understanding.  Pneumococcal vaccine status: Due, Education has been provided regarding the importance of  this vaccine. Advised may receive this vaccine at local pharmacy or Health Dept. Aware to provide a copy of the vaccination record if obtained from local pharmacy or Health Dept. Verbalized acceptance and understanding.  Covid-19 vaccine status: Declined, Education has been provided regarding the importance of this vaccine but patient still declined. Advised may receive this vaccine at  local pharmacy or Health Dept.or vaccine clinic. Aware to provide a copy of the vaccination record if obtained from local pharmacy or Health Dept. Verbalized acceptance and understanding.  Qualifies for Shingles Vaccine? Yes   Zostavax completed No   Shingrix Completed?: No.    Education has been provided regarding the importance of this vaccine. Patient has been advised to call insurance company to determine out of pocket expense if they have not yet received this vaccine. Advised may also receive vaccine at local pharmacy or Health Dept. Verbalized acceptance and understanding.  Screening Tests Health Maintenance  Topic Date Due   COVID-19 Vaccine (1) Never done   Hepatitis C Screening  Never done   TETANUS/TDAP  Never done   COLONOSCOPY (Pts 45-31yrs Insurance coverage will need to be confirmed)  Never done   Zoster Vaccines- Shingrix (1 of 2) Never done   Pneumonia Vaccine 48+ Years old (1 - PCV) Never done   INFLUENZA VACCINE  04/16/2022   HPV VACCINES  Aged Out    Health Maintenance  Health Maintenance Due  Topic Date Due   COVID-19 Vaccine (1) Never done   Hepatitis C Screening  Never done   TETANUS/TDAP  Never done   COLONOSCOPY (Pts 45-33yrs Insurance coverage will need to be confirmed)  Never done   Zoster Vaccines- Shingrix (1 of 2) Never done   Pneumonia Vaccine 81+ Years old (1 - PCV) Never done   INFLUENZA VACCINE  04/16/2022    Colorectal Screening: Over due   Lung Cancer Screening: (Low Dose CT Chest recommended if Age 76-80 years, 30 pack-year currently smoking OR have quit w/in 15years.) does not qualify.    Additional Screening:  Hepatitis C Screening: does not qualify; Completed   Vision Screening: Recommended annual ophthalmology exams for early detection of glaucoma and other disorders of the eye. Is the patient up to date with their annual eye exam?  Yes  Who is the provider or what is the name of the office in which the patient attends annual eye  exams?  If pt is not established with a provider, would they like to be referred to a provider to establish care? No .   Dental Screening: Recommended annual dental exams for proper oral hygiene  Community Resource Referral / Chronic Care Management: CRR required this visit?  No   CCM required this visit?  No      Plan:     I have personally reviewed and noted the following in the patient's chart:   Medical and social history Use of alcohol, tobacco or illicit drugs  Current medications and supplements including opioid prescriptions. Patient is not currently taking opioid prescriptions. Functional ability and status Nutritional status Physical activity Advanced directives List of other physicians Hospitalizations, surgeries, and ER visits in previous 12 months Vitals Screenings to include cognitive, depression, and falls Referrals and appointments  In addition, I have reviewed and discussed with patient certain preventive protocols, quality metrics, and best practice recommendations. A written personalized care plan for preventive services as well as general preventive health recommendations were provided to patient.    Scott Meyer , Thank you for taking  time to come for your Medicare Wellness Visit. I appreciate your ongoing commitment to your health goals. Please review the following plan we discussed and let me know if I can assist you in the future.   These are the goals we discussed:  Goals      Stay Active and Independent     Why is this important?   Regular activity or exercise is important to managing back pain.  Activity helps to keep your muscles strong.  You will sleep better and feel more relaxed.  You will have more energy and feel less stressed.  If you are not active now, start slowly. Little changes make a big difference.  Rest, but not too much.  Stay as active as you can and listen to your body's signals.            This is a list of the screening  recommended for you and due dates:  Health Maintenance  Topic Date Due   COVID-19 Vaccine (1) Never done   Hepatitis C Screening: USPSTF Recommendation to screen - Ages 68-79 yo.  Never done   Tetanus Vaccine  Never done   Colon Cancer Screening  Never done   Zoster (Shingles) Vaccine (1 of 2) Never done   Pneumonia Vaccine (1 - PCV) Never done   Flu Shot  04/16/2022   HPV Vaccine  Aged [address] [place], [place]   05/24/2022   Nurse Notes: 40 minute Non-Face-to-Face Wellness Visit

## 2022-05-24 NOTE — Patient Instructions (Signed)
Health Maintenance, Male Adopting a healthy lifestyle and getting preventive care are important in promoting health and wellness. Ask your health care provider about: The right schedule for you to have regular tests and exams. Things you can do on your own to prevent diseases and keep yourself healthy. What should I know about diet, weight, and exercise? Eat a healthy diet  Eat a diet that includes plenty of vegetables, fruits, low-fat dairy products, and lean protein. Do not eat a lot of foods that are high in solid fats, added sugars, or sodium. Maintain a healthy weight Body mass index (BMI) is a measurement that can be used to identify possible weight problems. It estimates body fat based on height and weight. Your health care provider can help determine your BMI and help you achieve or maintain a healthy weight. Get regular exercise Get regular exercise. This is one of the most important things you can do for your health. Most adults should: Exercise for at least 150 minutes each week. The exercise should increase your heart rate and make you sweat (moderate-intensity exercise). Do strengthening exercises at least twice a week. This is in addition to the moderate-intensity exercise. Spend less time sitting. Even light physical activity can be beneficial. Watch cholesterol and blood lipids Have your blood tested for lipids and cholesterol at 72 years of age, then have this test every 5 years. You may need to have your cholesterol levels checked more often if: Your lipid or cholesterol levels are high. You are older than 72 years of age. You are at high risk for heart disease. What should I know about cancer screening? Many types of cancers can be detected early and may often be prevented. Depending on your health history and family history, you may need to have cancer screening at various ages. This may include screening for: Colorectal cancer. Prostate cancer. Skin cancer. Lung  cancer. What should I know about heart disease, diabetes, and high blood pressure? Blood pressure and heart disease High blood pressure causes heart disease and increases the risk of stroke. This is more likely to develop in people who have high blood pressure readings or are overweight. Talk with your health care provider about your target blood pressure readings. Have your blood pressure checked: Every 3-5 years if you are 18-39 years of age. Every year if you are 40 years old or older. If you are between the ages of 65 and 75 and are a current or former smoker, ask your health care provider if you should have a one-time screening for abdominal aortic aneurysm (AAA). Diabetes Have regular diabetes screenings. This checks your fasting blood sugar level. Have the screening done: Once every three years after age 45 if you are at a normal weight and have a low risk for diabetes. More often and at a younger age if you are overweight or have a high risk for diabetes. What should I know about preventing infection? Hepatitis B If you have a higher risk for hepatitis B, you should be screened for this virus. Talk with your health care provider to find out if you are at risk for hepatitis B infection. Hepatitis C Blood testing is recommended for: Everyone born from 1945 through 1965. Anyone with known risk factors for hepatitis C. Sexually transmitted infections (STIs) You should be screened each year for STIs, including gonorrhea and chlamydia, if: You are sexually active and are younger than 72 years of age. You are older than 72 years of age and your   health care provider tells you that you are at risk for this type of infection. Your sexual activity has changed since you were last screened, and you are at increased risk for chlamydia or gonorrhea. Ask your health care provider if you are at risk. Ask your health care provider about whether you are at high risk for HIV. Your health care provider  may recommend a prescription medicine to help prevent HIV infection. If you choose to take medicine to prevent HIV, you should first get tested for HIV. You should then be tested every 3 months for as long as you are taking the medicine. Follow these instructions at home: Alcohol use Do not drink alcohol if your health care provider tells you not to drink. If you drink alcohol: Limit how much you have to 0-2 drinks a day. Know how much alcohol is in your drink. In the [place] one drink equals one 12 oz bottle of beer (355 mL), one 5 oz glass of wine (148 mL), or one 1 oz glass of hard liquor (44 mL). Lifestyle Do not use any products that contain nicotine or tobacco. These products include cigarettes, chewing tobacco, and vaping devices, such as e-cigarettes. If you need help quitting, ask your health care provider. Do not use street drugs. Do not share needles. Ask your health care provider for help if you need support or information about quitting drugs. General instructions Schedule regular health, dental, and eye exams. Stay current with your vaccines. Tell your health care provider if: You often feel depressed. You have ever been abused or do not feel safe at home. Summary Adopting a healthy lifestyle and getting preventive care are important in promoting health and wellness. Follow your health care provider's instructions about healthy diet, exercising, and getting tested or screened for diseases. Follow your health care provider's instructions on monitoring your cholesterol and blood pressure. This information is not intended to replace advice given to you by your health care provider. Make sure you discuss any questions you have with your health care provider. Document Revised: 01/22/2021 Document Reviewed: 01/22/2021 Elsevier Patient Education  2023 Elsevier Inc.  

## 2022-06-13 ENCOUNTER — Encounter: Payer: Self-pay | Admitting: Family Medicine

## 2022-06-13 ENCOUNTER — Ambulatory Visit (INDEPENDENT_AMBULATORY_CARE_PROVIDER_SITE_OTHER): Payer: Medicare Other | Admitting: Family Medicine

## 2022-06-13 ENCOUNTER — Other Ambulatory Visit: Payer: Self-pay

## 2022-06-13 VITALS — BP 120/62 | HR 60 | Ht 75.0 in | Wt 229.0 lb

## 2022-06-13 DIAGNOSIS — L03012 Cellulitis of left finger: Secondary | ICD-10-CM | POA: Diagnosis not present

## 2022-06-13 DIAGNOSIS — E7801 Familial hypercholesterolemia: Secondary | ICD-10-CM

## 2022-06-13 MED ORDER — CEPHALEXIN 500 MG PO CAPS: 500.0000 mg | ORAL_CAPSULE | Freq: Two times a day (BID) | ORAL | 0 refills | Status: DC

## 2022-06-13 MED ORDER — ATORVASTATIN CALCIUM 40 MG PO TABS: 40.0000 mg | ORAL_TABLET | Freq: Every day | ORAL | 0 refills | Status: DC

## 2022-06-13 NOTE — Progress Notes (Signed)
Date:  06/13/2022   Name:  Scott Meyer   DOB:  1951/07/09   MRN:  [id number]   Chief Complaint: Blood Pressure Check and Hyperlipidemia  Hyperlipidemia    Lab Results  Component Value Date   NA 139 04/16/2022   K 3.7 04/16/2022   CO2 23 04/16/2022   GLUCOSE 97 04/16/2022   BUN 18 04/16/2022   CREATININE 0.99 04/16/2022   CALCIUM 9.3 04/16/2022   GFRNONAA >60 04/16/2022   Lab Results  Component Value Date   CHOL 109 06/13/2021   HDL 36 (L) 06/13/2021   LDLCALC 63 06/13/2021   TRIG 52 06/13/2021   CHOLHDL 3.0 06/13/2021   Lab Results  Component Value Date   TSH 1.745 11/27/2020   Lab Results  Component Value Date   HGBA1C 4.9 11/29/2020   Lab Results  Component Value Date   WBC 8.7 11/28/2020   HGB 15.1 11/28/2020   HCT 45.6 11/28/2020   MCV 92.1 11/28/2020   PLT 165 11/28/2020   Lab Results  Component Value Date   ALT 24 11/27/2020   AST 30 11/27/2020   ALKPHOS 81 11/27/2020   BILITOT 1.2 11/27/2020   No results found for: "25OHVITD2", "25OHVITD3", "VD25OH"   Review of Systems  Patient Active Problem List   Diagnosis Date Noted   Dilated cardiomyopathy (HCC) 11/30/2020   Atrial tachycardia (HCC) 11/30/2020   Frequent PVCs 11/30/2020   Troponin level elevated 11/29/2020   Benign essential HTN 11/29/2020   HLD (hyperlipidemia) 11/29/2020   Hypomagnesemia 11/29/2020   Amputation of right arm (HCC) 11/29/2020   Acute combined systolic and diastolic congestive heart failure (HCC) 11/29/2020   NSVT (nonsustained ventricular tachycardia) (HCC) 11/29/2020   Chest pain 11/27/2020    No Known Allergies  Past Surgical History:  Procedure Laterality Date   ARM AMPUTATION AT ELBOW Right    KNEE SURGERY      Social History   Tobacco Use   Smoking status: Former    Types: Cigarettes    Quit date: 10/09/2017    Years since quitting: 4.6   Smokeless tobacco: Never  Vaping Use   Vaping Use: Never used  Substance Use Topics   Alcohol use: No     Comment: quit 8 months ago   Drug use: No     Medication list has been reviewed and updated.  Current Meds  Medication Sig   atorvastatin (LIPITOR) 40 MG tablet TAKE 1 TABLET(40 MG) BY MOUTH DAILY (Patient taking differently: Take 20 mg by mouth daily.)   furosemide (LASIX) 40 MG tablet TAKE 1 TABLET(40 MG) BY MOUTH DAILY (Patient taking differently: Take 40 mg by mouth daily. Takes one tablet every few days)   losartan (COZAAR) 25 MG tablet TAKE 1 TABLET(25 MG) BY MOUTH DAILY   potassium chloride (KLOR-CON) 10 MEQ tablet Take 1 tablet (10 mEq total) by mouth daily.       06/13/2022   10:59 AM 04/16/2022    1:11 PM 01/23/2022   10:17 AM 12/20/2021   10:49 AM  GAD 7 : Generalized Anxiety Score  Nervous, Anxious, on Edge 0 0 0 0  Control/stop worrying 0 0 0 0  Worry too much - different things 0 0 0 0  Trouble relaxing 0 0 0 0  Restless 0 0 0 0  Easily annoyed or irritable 0 0 0 0  Afraid - awful might happen 0 0 0 0  Total GAD 7 Score 0 0 0 0  Anxiety Difficulty Not  difficult at all Not difficult at all Not difficult at all Not difficult at all       06/13/2022   10:58 AM 05/24/2022   11:25 AM 04/16/2022    1:09 PM  Depression screen PHQ 2/9  Decreased Interest 0 0 0  Down, Depressed, Hopeless 0 0 0  PHQ - 2 Score 0 0 0  Altered sleeping 0    Tired, decreased energy 0    Change in appetite 0    Feeling bad or failure about yourself  0    Trouble concentrating 0    Moving slowly or fidgety/restless 0    Suicidal thoughts 0    PHQ-9 Score 0    Difficult doing work/chores Not difficult at all      BP Readings from Last 3 Encounters:  06/13/22 120/62  04/16/22 138/72  01/23/22 (!) 157/85    Physical Exam  Wt Readings from Last 3 Encounters:  06/13/22 229 lb (103.9 kg)  04/16/22 231 lb 2 oz (104.8 kg)  01/23/22 240 lb 4 oz (109 kg)    BP 120/62   Pulse 60   Ht 6\' 3"  (1.905 m)   Wt 229 lb (103.9 kg)   BMI 28.62 kg/m   Assessment and Plan:     Otilio Miu, MD

## 2022-06-13 NOTE — Progress Notes (Signed)
     Primary Care / Sports Medicine Office Visit  Patient Information:  Patient ID: Scott Meyer, male DOB: 1950/10/23 Age: 71 y.o. MRN: [id number]   [name] is a pleasant 71 y.o. male presenting with the following:  Chief Complaint  Patient presents with   Paronychia of finger, left    Vitals:   06/13/22 1128  BP: 120/62  Pulse: 60   Vitals:   06/13/22 1128  Weight: 229 lb (103.9 kg)  Height: 6\' 3"  (1.905 m)   Body mass index is 28.62 kg/m.  No results found.   Independent interpretation of notes and tests performed by another provider:   None  Procedures performed:   Procedure:  Incision and drainage of left index finger paronychia Risks, benefits, and alternatives explained and consent obtained. Time out conducted. Surface cleaned with alcohol. 5 cc lidocaine utilized for digital nerve block. Adequate anesthesia ensured. Area prepped and draped in a sterile fashion. 18-gauge needle tip used to make a stab incision into abscess. Pus expressed with pressure. Hemostasis achieved. Pt stable. Aftercare and follow-up advised.   Pertinent History, Exam, Impression, and Recommendations:   Problem List Items Addressed This Visit       Musculoskeletal and Integument   Paronychia of left index finger - Primary    Patient with medical history significant for right arm amputation presenting with several day history of persistent pain localized to the left index finger.  He has noted pain, swelling, thinks onset was after finger brushing by a warm pan.  Examination reveals swelling adjacent to the radial aspect of the nail, yellowing, findings most consistent with paronychia.  Treatment strategies outlined, given the size of this area, he did elect to proceed with incision and drainage, tolerated this well after digital nerve block, post care reviewed.  He will also be placed on a course of Keflex and can follow-up on an as-needed basis.      Relevant Medications    cephALEXin (KEFLEX) 500 MG capsule     Orders & Medications Meds ordered this encounter  Medications   cephALEXin (KEFLEX) 500 MG capsule    Sig: Take 1 capsule (500 mg total) by mouth 2 (two) times daily.    Dispense:  14 capsule    Refill:  0   No orders of the defined types were placed in this encounter.    No follow-ups on file.     Montel Culver, MD   Primary Care Sports Medicine [place]

## 2022-06-13 NOTE — Assessment & Plan Note (Signed)
Patient with medical history significant for right arm amputation presenting with several day history of persistent pain localized to the left index finger.  He has noted pain, swelling, thinks onset was after finger brushing by a warm pan.  Examination reveals swelling adjacent to the radial aspect of the nail, yellowing, findings most consistent with paronychia.  Treatment strategies outlined, given the size of this area, he did elect to proceed with incision and drainage, tolerated this well after digital nerve block, post care reviewed.  He will also be placed on a course of Keflex and can follow-up on an as-needed basis.

## 2022-06-13 NOTE — Patient Instructions (Signed)
-   Take antibiotics for full course - Follow wound care information below - Contact for questions and follow-up as needed Wound care Keep the affected area clean. Keep the area dry. Follow instructions from your health care provider about how to take care of the affected area. Make sure you: Wash your hands with soap and water for at least 20 seconds before and after you change your dressing. If soap and water are not available, use hand sanitizer. Change your dressing 1-2 times daily and as-needed. Check the area every day for signs of infection. Check for: Redness, swelling, or pain. Fluid or blood. Warmth. Pus or a bad smell.

## 2022-06-25 ENCOUNTER — Telehealth (HOSPITAL_COMMUNITY): Payer: Self-pay

## 2022-06-25 NOTE — Telephone Encounter (Signed)
Titus states doing well, have a home visit set up for Monday the 16th of October for home visit.   [place] [phone]

## 2022-07-01 ENCOUNTER — Other Ambulatory Visit: Payer: Self-pay | Admitting: Family

## 2022-07-01 ENCOUNTER — Other Ambulatory Visit (HOSPITAL_COMMUNITY): Payer: Self-pay

## 2022-07-01 MED ORDER — POTASSIUM CHLORIDE ER 10 MEQ PO TBCR: 10.0000 meq | EXTENDED_RELEASE_TABLET | Freq: Every day | ORAL | 6 refills | Status: DC

## 2022-07-02 ENCOUNTER — Encounter (HOSPITAL_COMMUNITY): Payer: Self-pay

## 2022-07-02 NOTE — Progress Notes (Signed)
Today had a home visit with [place].  He states doing good.  He states was cooking some fat back and salmon.  His neighbor is there and sister that lives with him.  Looking through his medications, he states quit taking some because making his side of chest hurt around his heart.  He states has went away stopping his meds.  Advised him importance of taking medications and purpose of each one.  He states will start taking them again.  He does not have any potassium, contacted Otila Kluver for prescription to be sent to his pharmacy.  Advised him he needs to pick it up and take with his furosemide, small white pill.  He describes each medication by color and shape.  Discussed a pill box but he did not want one.  He denies any complaints today such as chest pain, headaches, dizziness or increased shortness of breath.  He has edema in left leg, right is not bad.  He is wearing tall socks.  He states his weight has been 232 but when weighed it was 235 lbs.  He states will weigh daily and call me with weight gain of 2 lbs or more.  He states his good weight is 235 lbs.  We discussed salty food such as fat back,  he states it is cheap and taste good but he will cut back.  He is aware of PCP appt next month, he states needs to be good til then so he can get a 6 month break this time.  He is a very happy man and pleasant to visit with.  Explained the program and will visit for medication management and heart failure,   [place] EMT-Paramedic [phone]

## 2022-08-06 ENCOUNTER — Encounter: Payer: Self-pay | Admitting: Family Medicine

## 2022-08-06 ENCOUNTER — Ambulatory Visit: Payer: Medicare Other | Admitting: Family Medicine

## 2022-08-12 ENCOUNTER — Ambulatory Visit: Payer: Medicare Other | Admitting: Family Medicine

## 2022-08-21 ENCOUNTER — Ambulatory Visit: Payer: Medicare Other | Admitting: Family Medicine

## 2022-09-30 NOTE — Progress Notes (Deleted)
Patient ID: Scott Meyer, male    DOB: [birth year], 72 y.o.   MRN: [id number]  HPI  Scott Meyer is a 72 y/o male with a history of HTN, previous tobacco use and chronic heart failure.   Echo report from 11/28/20 reviewed and showed an EF of 25-30% along with moderate LVH.   Hasn't been admitted or been in the ED in the last 6 months.   He presents today for a follow-up visit with a chief complaint of   Past Medical History:  Diagnosis Date   Cardiomyopathy ([place])    a. 11/2020 Echo: EF 25-30%, mod LVH, Gr1 DD. Mild AoV Ca2+ w/ mild to mod AoV sclerosis.   CHF (congestive heart failure) (HCC)    Hypertension    Past Surgical History:  Procedure Laterality Date   ARM AMPUTATION AT ELBOW Right    KNEE SURGERY     Family History  Problem Relation Age of Onset   Diabetes Mother    Cirrhosis Father    Alcohol abuse Father    Other Brother        gsw   Cancer Brother        'head full of tumors'   Other Brother        'killed w/ 2x4 to the head'   Other Sister        mva   Social History   Tobacco Use   Smoking status: Former    Types: Cigarettes    Quit date: 10/09/2017    Years since quitting: 4.9   Smokeless tobacco: Never  Substance Use Topics   Alcohol use: No    Comment: quit 8 months ago   No Known Allergies    Review of Systems  Constitutional:  Positive for fatigue. Negative for appetite change.  HENT:  Negative for congestion, postnasal drip and sore throat.   Eyes: Negative.   Respiratory:  Positive for shortness of breath. Negative for cough and chest tightness.   Cardiovascular:  Positive for palpitations (sometimes) and leg swelling (right ankle > L). Negative for chest pain.  Gastrointestinal:  Negative for abdominal distention and abdominal pain.  Endocrine: Negative.   Genitourinary: Negative.   Musculoskeletal:  Positive for neck pain. Negative for back pain.  Skin: Negative.   Allergic/Immunologic: Negative.   Neurological:  Positive for  light-headedness (at times). Negative for dizziness.  Hematological:  Negative for adenopathy. Does not bruise/bleed easily.  Psychiatric/Behavioral:  Negative for dysphoric mood and sleep disturbance (sleeping on 1 pillow). The patient is nervous/anxious.      Physical Exam Vitals and nursing note reviewed.  Constitutional:      Appearance: Normal appearance.  HENT:     Head: Normocephalic and atraumatic.  Cardiovascular:     Rate and Rhythm: Normal rate and regular rhythm.  Pulmonary:     Effort: Pulmonary effort is normal. No respiratory distress.     Breath sounds: No wheezing or rales.  Abdominal:     General: Abdomen is flat. There is no distension.     Palpations: Abdomen is soft.  Musculoskeletal:        General: Deformity (right arm amputation at elbow) present. No tenderness.     Cervical back: Normal range of motion and neck supple.     Right lower leg: Edema (2+ pitting) present.     Left lower leg: Edema (1+ pitting) present.  Skin:    General: Skin is warm and dry.  Neurological:     General: No  focal deficit present.     Mental Status: He is alert and oriented to person, place, and time.  Psychiatric:        Mood and Affect: Mood is anxious.        Speech: Speech is rapid and pressured.        Behavior: Behavior normal.    Assessment & Plan:  1: Chronic heart failure with reduced ejection fraction- - NYHA class II - euvolemic today - says that he's weighing daily; reminded to call for an overnight weight gain of > 2 pounds or a weekly weight gain of > 5 pounds - weight 231.2 pounds from last visit here 6 months ago - not adding salt but "just a pinch" when eating potatoes, eggs or cube steak. Reinforced the importance of not adding salt to his food and reading food labels for sodium content although he says that he can't read very well - already rinses canned vegetables - on GDMT of losartan and not metoprolol but he's unsure of why - BNP 11/27/20 was  141.1  2: HTN- - BP  - saw PCP Ronnald Ramp) 06/13/22 - BMP 04/16/22 reviewed and showed sodium 139, potassium 3.7, creatinine 0.99 and GFR >60  3: Tobacco use- - says that he hasn't smoked any in ~ 1 year - no alcohol since longer than that   Medication bottles reviewed.

## 2022-10-01 ENCOUNTER — Other Ambulatory Visit (HOSPITAL_COMMUNITY): Payer: Self-pay

## 2022-10-01 ENCOUNTER — Ambulatory Visit: Payer: Medicare Other | Admitting: Family

## 2022-10-01 ENCOUNTER — Telehealth: Payer: Self-pay | Admitting: Family

## 2022-10-01 NOTE — Telephone Encounter (Signed)
Patient did not show for his Heart Failure Clinic appointment on 10/01/22. Will attempt to reschedule.

## 2022-11-18 ENCOUNTER — Other Ambulatory Visit: Payer: Self-pay

## 2022-11-18 ENCOUNTER — Encounter: Payer: Self-pay | Admitting: Family Medicine

## 2022-11-18 ENCOUNTER — Emergency Department: Payer: 59

## 2022-11-18 ENCOUNTER — Ambulatory Visit (INDEPENDENT_AMBULATORY_CARE_PROVIDER_SITE_OTHER): Payer: 59 | Admitting: Family Medicine

## 2022-11-18 ENCOUNTER — Inpatient Hospital Stay
Admission: EM | Admit: 2022-11-18 | Discharge: 2022-11-22 | DRG: 291 | Disposition: A | Discharge status: Home / Self Care | Payer: 59 | Attending: Internal Medicine | Admitting: Internal Medicine

## 2022-11-18 VITALS — BP 128/58 | HR 62 | Ht 75.0 in | Wt 261.0 lb

## 2022-11-18 DIAGNOSIS — Z87891 Personal history of nicotine dependence: Secondary | ICD-10-CM | POA: Diagnosis not present

## 2022-11-18 DIAGNOSIS — E876 Hypokalemia: Secondary | ICD-10-CM | POA: Diagnosis not present

## 2022-11-18 DIAGNOSIS — Z808 Family history of malignant neoplasm of other organs or systems: Secondary | ICD-10-CM | POA: Diagnosis not present

## 2022-11-18 DIAGNOSIS — Z6830 Body mass index (BMI) 30.0-30.9, adult: Secondary | ICD-10-CM | POA: Diagnosis not present

## 2022-11-18 DIAGNOSIS — R778 Other specified abnormalities of plasma proteins: Secondary | ICD-10-CM | POA: Diagnosis not present

## 2022-11-18 DIAGNOSIS — J811 Chronic pulmonary edema: Secondary | ICD-10-CM

## 2022-11-18 DIAGNOSIS — Z1152 Encounter for screening for COVID-19: Secondary | ICD-10-CM | POA: Diagnosis not present

## 2022-11-18 DIAGNOSIS — Z79899 Other long term (current) drug therapy: Secondary | ICD-10-CM

## 2022-11-18 DIAGNOSIS — I429 Cardiomyopathy, unspecified: Secondary | ICD-10-CM | POA: Diagnosis present

## 2022-11-18 DIAGNOSIS — E669 Obesity, unspecified: Secondary | ICD-10-CM | POA: Diagnosis present

## 2022-11-18 DIAGNOSIS — I110 Hypertensive heart disease with heart failure: Principal | ICD-10-CM | POA: Diagnosis present

## 2022-11-18 DIAGNOSIS — E872 Acidosis, unspecified: Secondary | ICD-10-CM

## 2022-11-18 DIAGNOSIS — R601 Generalized edema: Secondary | ICD-10-CM | POA: Diagnosis not present

## 2022-11-18 DIAGNOSIS — E785 Hyperlipidemia, unspecified: Secondary | ICD-10-CM | POA: Diagnosis present

## 2022-11-18 DIAGNOSIS — I420 Dilated cardiomyopathy: Secondary | ICD-10-CM | POA: Diagnosis not present

## 2022-11-18 DIAGNOSIS — Z91199 Patient's noncompliance with other medical treatment and regimen due to unspecified reason: Secondary | ICD-10-CM

## 2022-11-18 DIAGNOSIS — I4719 Other supraventricular tachycardia: Secondary | ICD-10-CM | POA: Diagnosis not present

## 2022-11-18 DIAGNOSIS — T502X5A Adverse effect of carbonic-anhydrase inhibitors, benzothiadiazides and other diuretics, initial encounter: Secondary | ICD-10-CM | POA: Diagnosis present

## 2022-11-18 DIAGNOSIS — Z833 Family history of diabetes mellitus: Secondary | ICD-10-CM

## 2022-11-18 DIAGNOSIS — Z89211 Acquired absence of right upper limb below elbow: Secondary | ICD-10-CM | POA: Diagnosis not present

## 2022-11-18 DIAGNOSIS — S48911A Complete traumatic amputation of right shoulder and upper arm, level unspecified, initial encounter: Secondary | ICD-10-CM

## 2022-11-18 DIAGNOSIS — R0902 Hypoxemia: Secondary | ICD-10-CM | POA: Diagnosis present

## 2022-11-18 DIAGNOSIS — R0602 Shortness of breath: Secondary | ICD-10-CM | POA: Diagnosis present

## 2022-11-18 DIAGNOSIS — K761 Chronic passive congestion of liver: Secondary | ICD-10-CM | POA: Diagnosis present

## 2022-11-18 DIAGNOSIS — I10 Essential (primary) hypertension: Secondary | ICD-10-CM | POA: Diagnosis not present

## 2022-11-18 DIAGNOSIS — Z91148 Patient's other noncompliance with medication regimen for other reason: Secondary | ICD-10-CM

## 2022-11-18 DIAGNOSIS — R399 Unspecified symptoms and signs involving the genitourinary system: Secondary | ICD-10-CM

## 2022-11-18 DIAGNOSIS — I493 Ventricular premature depolarization: Secondary | ICD-10-CM | POA: Diagnosis not present

## 2022-11-18 DIAGNOSIS — I472 Ventricular tachycardia, unspecified: Secondary | ICD-10-CM | POA: Diagnosis not present

## 2022-11-18 DIAGNOSIS — E7801 Familial hypercholesterolemia: Secondary | ICD-10-CM

## 2022-11-18 DIAGNOSIS — I509 Heart failure, unspecified: Secondary | ICD-10-CM | POA: Diagnosis not present

## 2022-11-18 DIAGNOSIS — I5023 Acute on chronic systolic (congestive) heart failure: Secondary | ICD-10-CM | POA: Diagnosis present

## 2022-11-18 DIAGNOSIS — Z811 Family history of alcohol abuse and dependence: Secondary | ICD-10-CM | POA: Diagnosis not present

## 2022-11-18 DIAGNOSIS — R7989 Other specified abnormal findings of blood chemistry: Secondary | ICD-10-CM

## 2022-11-18 DIAGNOSIS — I428 Other cardiomyopathies: Secondary | ICD-10-CM | POA: Diagnosis not present

## 2022-11-18 DIAGNOSIS — I5041 Acute combined systolic (congestive) and diastolic (congestive) heart failure: Secondary | ICD-10-CM

## 2022-11-18 DIAGNOSIS — I5043 Acute on chronic combined systolic (congestive) and diastolic (congestive) heart failure: Secondary | ICD-10-CM | POA: Diagnosis not present

## 2022-11-18 LAB — URINALYSIS, COMPLETE (UACMP) WITH MICROSCOPIC
Bacteria, UA: NONE SEEN
Bilirubin Urine: NEGATIVE
Glucose, UA: NEGATIVE mg/dL
Hgb urine dipstick: NEGATIVE
Ketones, ur: NEGATIVE mg/dL
Leukocytes,Ua: NEGATIVE
Nitrite: NEGATIVE
Protein, ur: NEGATIVE mg/dL
Specific Gravity, Urine: 1.004 — ABNORMAL LOW (ref 1.005–1.030)
pH: 6 (ref 5.0–8.0)

## 2022-11-18 LAB — CBC WITH DIFFERENTIAL/PLATELET
Abs Immature Granulocytes: 0.01 10*3/uL (ref 0.00–0.07)
Basophils Absolute: 0 10*3/uL (ref 0.0–0.1)
Basophils Relative: 1 %
Eosinophils Absolute: 0.1 10*3/uL (ref 0.0–0.5)
Eosinophils Relative: 1 %
HCT: 47.5 % (ref 39.0–52.0)
Hemoglobin: 14.5 g/dL (ref 13.0–17.0)
Immature Granulocytes: 0 %
Lymphocytes Relative: 24 %
Lymphs Abs: 1.5 10*3/uL (ref 0.7–4.0)
MCH: 30.7 pg (ref 26.0–34.0)
MCHC: 30.5 g/dL (ref 30.0–36.0)
MCV: 100.4 fL — ABNORMAL HIGH (ref 80.0–100.0)
Monocytes Absolute: 0.5 10*3/uL (ref 0.1–1.0)
Monocytes Relative: 8 %
Neutro Abs: 4.3 10*3/uL (ref 1.7–7.7)
Neutrophils Relative %: 66 %
Platelets: 162 10*3/uL (ref 150–400)
RBC: 4.73 MIL/uL (ref 4.22–5.81)
RDW: 13.3 % (ref 11.5–15.5)
WBC: 6.5 10*3/uL (ref 4.0–10.5)
nRBC: 0 % (ref 0.0–0.2)

## 2022-11-18 LAB — COMPREHENSIVE METABOLIC PANEL
ALT: 48 U/L — ABNORMAL HIGH (ref 0–44)
AST: 48 U/L — ABNORMAL HIGH (ref 15–41)
Albumin: 3.3 g/dL — ABNORMAL LOW (ref 3.5–5.0)
Alkaline Phosphatase: 92 U/L (ref 38–126)
Anion gap: 11 (ref 5–15)
BUN: 23 mg/dL (ref 8–23)
CO2: 18 mmol/L — ABNORMAL LOW (ref 22–32)
Calcium: 9 mg/dL (ref 8.9–10.3)
Chloride: 108 mmol/L (ref 98–111)
Creatinine, Ser: 1.11 mg/dL (ref 0.61–1.24)
GFR, Estimated: >60 mL/min (ref >60)
Glucose, Bld: 91 mg/dL (ref 70–99)
Potassium: 4.2 mmol/L (ref 3.5–5.1)
Sodium: 137 mmol/L (ref 135–145)
Total Bilirubin: 2.3 mg/dL — ABNORMAL HIGH (ref 0.3–1.2)
Total Protein: 6.7 g/dL (ref 6.5–8.1)

## 2022-11-18 LAB — PROCALCITONIN: Procalcitonin: <0.1 ng/mL

## 2022-11-18 LAB — RESP PANEL BY RT-PCR (RSV, FLU A&B, COVID)  RVPGX2
Influenza A by PCR: NEGATIVE
Influenza B by PCR: NEGATIVE
Resp Syncytial Virus by PCR: NEGATIVE
SARS Coronavirus 2 by RT PCR: NEGATIVE

## 2022-11-18 LAB — TROPONIN I (HIGH SENSITIVITY)
Troponin I (High Sensitivity): 38 ng/L — ABNORMAL HIGH (ref <18)
Troponin I (High Sensitivity): 38 ng/L — ABNORMAL HIGH (ref <18)

## 2022-11-18 LAB — CK: Total CK: 296 U/L (ref 49–397)

## 2022-11-18 LAB — BRAIN NATRIURETIC PEPTIDE: B Natriuretic Peptide: 1074.7 pg/mL — ABNORMAL HIGH (ref 0.0–100.0)

## 2022-11-18 MED ORDER — SODIUM CHLORIDE 0.9 % IV SOLN
2.0000 g | Freq: Once | INTRAVENOUS | Status: AC
  Administered 2022-11-18: 2 g via INTRAVENOUS
  Filled 2022-11-18: qty 20

## 2022-11-18 MED ORDER — AZITHROMYCIN 500 MG PO TABS
500.0000 mg | ORAL_TABLET | Freq: Once | ORAL | Status: AC
  Administered 2022-11-18: 500 mg via ORAL
  Filled 2022-11-18: qty 1

## 2022-11-18 MED ORDER — FUROSEMIDE 10 MG/ML IJ SOLN
80.0000 mg | Freq: Once | INTRAMUSCULAR | Status: AC
  Administered 2022-11-18: 80 mg via INTRAVENOUS
  Filled 2022-11-18: qty 8

## 2022-11-18 MED ORDER — TAMSULOSIN HCL 0.4 MG PO CAPS
0.4000 mg | ORAL_CAPSULE | Freq: Every day | ORAL | Status: DC
  Administered 2022-11-18 – 2022-11-22 (×5): 0.4 mg via ORAL
  Filled 2022-11-18 (×5): qty 1

## 2022-11-18 NOTE — Assessment & Plan Note (Signed)
Check bladder scan, urinalysis, empiric trial of tamsulosin

## 2022-11-18 NOTE — ED Notes (Signed)
RN called lab for estimated time on CMP and troponin result. States it is running not sure how much longer. MD Jari Pigg notified.

## 2022-11-18 NOTE — Assessment & Plan Note (Signed)
This is long stadning . Not concerning for ACS

## 2022-11-18 NOTE — Progress Notes (Signed)
Date:  11/18/2022   Name:  Scott Meyer   DOB:  02/06/1951   MRN:  [id number]   Chief Complaint: Congestive Heart Failure  Congestive Heart Failure Presents for follow-up visit. Associated symptoms include chest pressure, edema, fatigue, nocturia, orthopnea, paroxysmal nocturnal dyspnea and shortness of breath. Pertinent negatives include no abdominal pain, chest pain, claudication, muscle weakness or palpitations. (DOE) The symptoms have been stable. Compliance with total regimen is 0-25%. Compliance problems include adherence to diet and medication side effects.  Compliance with diet is 26-50% (little bit of salt). Compliance with exercise is 0-25%. Compliance with medications is 0-25%.    Lab Results  Component Value Date   NA 139 04/16/2022   K 3.7 04/16/2022   CO2 23 04/16/2022   GLUCOSE 97 04/16/2022   BUN 18 04/16/2022   CREATININE 0.99 04/16/2022   CALCIUM 9.3 04/16/2022   GFRNONAA >60 04/16/2022   Lab Results  Component Value Date   CHOL 109 06/13/2021   HDL 36 (L) 06/13/2021   LDLCALC 63 06/13/2021   TRIG 52 06/13/2021   CHOLHDL 3.0 06/13/2021   Lab Results  Component Value Date   TSH 1.745 11/27/2020   Lab Results  Component Value Date   HGBA1C 4.9 11/29/2020   Lab Results  Component Value Date   WBC 8.7 11/28/2020   HGB 15.1 11/28/2020   HCT 45.6 11/28/2020   MCV 92.1 11/28/2020   PLT 165 11/28/2020   Lab Results  Component Value Date   ALT 24 11/27/2020   AST 30 11/27/2020   ALKPHOS 81 11/27/2020   BILITOT 1.2 11/27/2020   No results found for: "25OHVITD2", "25OHVITD3", "VD25OH"   Review of Systems  Constitutional:  Positive for fatigue.  Respiratory:  Positive for cough, chest tightness and shortness of breath. Negative for choking, wheezing and stridor.   Cardiovascular:  Positive for leg swelling. Negative for chest pain, palpitations and claudication.  Gastrointestinal:  Negative for abdominal pain.  Genitourinary:  Positive for nocturia.   Musculoskeletal:  Negative for muscle weakness.    Patient Active Problem List   Diagnosis Date Noted   Paronychia of left index finger 06/13/2022   Dilated cardiomyopathy ([place]) 11/30/2020   Atrial tachycardia 11/30/2020   Frequent PVCs 11/30/2020   Troponin level elevated 11/29/2020   Benign essential HTN 11/29/2020   HLD (hyperlipidemia) 11/29/2020   Hypomagnesemia 11/29/2020   Amputation of right arm ([place]) 11/29/2020   Acute combined systolic and diastolic congestive heart failure ([place]) 11/29/2020   NSVT (nonsustained ventricular tachycardia) ([place]) 11/29/2020   Chest pain 11/27/2020    No Known Allergies  Past Surgical History:  Procedure Laterality Date   ARM AMPUTATION AT ELBOW Right    KNEE SURGERY      Social History   Tobacco Use   Smoking status: Former    Types: Cigarettes    Quit date: 10/09/2017    Years since quitting: 5.1   Smokeless tobacco: Never  Vaping Use   Vaping Use: Never used  Substance Use Topics   Alcohol use: No    Comment: quit 8 months ago   Drug use: No     Medication list has been reviewed and updated.  Current Meds  Medication Sig   furosemide (LASIX) 40 MG tablet TAKE 1 TABLET(40 MG) BY MOUTH DAILY       11/18/2022    2:53 PM 06/13/2022   10:59 AM 04/16/2022    1:11 PM 01/23/2022   10:17 AM  GAD 7 :  Generalized Anxiety Score  Nervous, Anxious, on Edge 0 0 0 0  Control/stop worrying 0 0 0 0  Worry too much - different things 0 0 0 0  Trouble relaxing 0 0 0 0  Restless 0 0 0 0  Easily annoyed or irritable 0 0 0 0  Afraid - awful might happen 0 0 0 0  Total GAD 7 Score 0 0 0 0  Anxiety Difficulty Not difficult at all Not difficult at all Not difficult at all Not difficult at all       11/18/2022    2:53 PM 06/13/2022   10:58 AM 05/24/2022   11:25 AM  Depression screen PHQ 2/9  Decreased Interest 0 0 0  Down, Depressed, Hopeless 0 0 0  PHQ - 2 Score 0 0 0  Altered sleeping 0 0   Tired, decreased energy 0 0   Change in  appetite 0 0   Feeling bad or failure about yourself  0 0   Trouble concentrating 0 0   Moving slowly or fidgety/restless 0 0   Suicidal thoughts 0 0   PHQ-9 Score 0 0   Difficult doing work/chores Not difficult at all Not difficult at all     BP Readings from Last 3 Encounters:  11/18/22 (!) 128/58  07/02/22 136/70  06/13/22 120/62    Physical Exam Vitals and nursing note reviewed.  HENT:     Head: Normocephalic.     Right Ear: Tympanic membrane and external ear normal.     Left Ear: Tympanic membrane and external ear normal.     Nose: Nose normal.  Eyes:     General: No scleral icterus.       Right eye: No discharge.        Left eye: No discharge.     Conjunctiva/sclera: Conjunctivae normal.     Pupils: Pupils are equal, round, and reactive to light.  Neck:     Thyroid: No thyromegaly.     Vascular: No JVD.     Trachea: No tracheal deviation.  Cardiovascular:     Rate and Rhythm: Normal rate and regular rhythm.     Heart sounds: Normal heart sounds, S1 normal and S2 normal. No murmur heard.    No systolic murmur is present.     No diastolic murmur is present.     No friction rub. No gallop. No S3 or S4 sounds.  Pulmonary:     Effort: No respiratory distress.     Breath sounds: Decreased air movement present. Examination of the right-lower field reveals decreased breath sounds. Examination of the left-lower field reveals decreased breath sounds. Decreased breath sounds present. No wheezing, rhonchi or rales.  Abdominal:     General: Bowel sounds are normal.     Palpations: Abdomen is soft. There is no mass.     Tenderness: There is no abdominal tenderness. There is no guarding or rebound.  Musculoskeletal:        General: No tenderness. Normal range of motion.     Cervical back: Normal range of motion and neck supple.     Right lower leg: Edema present.     Left lower leg: Edema present.  Lymphadenopathy:     Cervical: No cervical adenopathy.  Skin:    General:  Skin is warm.     Findings: No rash.  Neurological:     Mental Status: He is alert and oriented to person, place, and time.     Cranial Nerves: No cranial  nerve deficit.     Deep Tendon Reflexes: Reflexes are normal and symmetric.     Wt Readings from Last 3 Encounters:  11/18/22 261 lb (118.4 kg)  07/02/22 235 lb 4 oz (106.7 kg)  06/13/22 229 lb (103.9 kg)    BP (!) 128/58   Pulse 62   Ht '6\' 3"'$  (1.905 m)   Wt 261 lb (118.4 kg)   SpO2 99%   BMI 32.62 kg/m   Assessment and Plan:  1. Acute combined systolic and diastolic congestive heart failure (HCC) Chronic.  Persistent.  Uncontrolled.  Patient has not had medications for several weeks and has not followed through with heart failure clinic with [facility] for various number of reasons.  In the past we have only done his cholesterol but patient thought he would bypass cardiology or is that he has gotten to the point that he has significant weight gain 40 pounds since September..  Since we saw him he has had a 26 pound weight gain with development of significant dyspnea on exertion, orthopnea, and bilateral leg swelling.  Patient is followed in heart failure clinic for dilated cardiomyopathy.  Will be sending him to the hospital for diuresis and to get him plugged back in with cardiology.    2. Noncompliance Patient initially blamed it on the COVID shot.  Patient also blames the [facility] ribbon and the black and mild cigar which only gave him chest pain across the chest.  I do not think that this is an outpatient salvage and that patient needs hospitalization for aggressive diuresis and resuming of medications and resumption of congestive heart failure clinic care.   Otilio Miu, MD

## 2022-11-18 NOTE — Assessment & Plan Note (Signed)
Fall precuations

## 2022-11-18 NOTE — ED Notes (Signed)
Lab at bedside to draw labs. Pt had a run of non sustained vtach. EKG strip given to MD [facility].

## 2022-11-18 NOTE — ED Notes (Signed)
RN attempted x2 to get redraw for hemolyzed labs. Unsuccessful attempts, RN called lab to come redraw.

## 2022-11-18 NOTE — Assessment & Plan Note (Signed)
At this time this is felt to be due to patient's prior known congestive heart failure and interruption of her diuretic therapy.  Therefore I will start the patient on intravenous infusion of Lasix with frequent monitoring of electrolytes, intake output and weight.

## 2022-11-18 NOTE — Assessment & Plan Note (Signed)
My primary concern is that this is related to congestion of the liver from patient's fluid overload, regardless I will check acute hepatitis panel and trend this.  Also check CK

## 2022-11-18 NOTE — ED Provider Notes (Signed)
[facility] - Closed Provider Note    Event Date/Time   First MD Initiated Contact with Patient 11/18/22 1605     (approximate)   History   Shortness of Breath   HPI  Scott Meyer is a 72 y.o. male with history of CHF with known EF of 25 to 35% supposed to be on Lasix he comes in with shortness of breath.  Patient is been noncompliant his Lasix for the past 4 months.  He reports that it causes him to urinate a lot which then causes penile discomfort so he has not been taking it.  He reports having worsening shortness of breath.  He went to his primary care doctor's office at Dr. Ronnald Meyer who had at the EMS called to be evaluated initially hypoxic into the 80s and placed on 3 L.  Patient denies any chest pain, abdominal pain Denies any falls.        Physical Exam   Triage Vital Signs: Blood pressure (!) 152/107, pulse 89, temperature (!) 97.3 F (36.3 C), temperature source Oral, resp. rate 20, height '6\' 3"'$  (1.905 m), weight 117.5 kg, SpO2 99 %.  Most recent vital signs: Vitals:   11/18/22 1613 11/18/22 1619  BP:  (!) 152/107  Pulse: 100 89  Resp: (!) 25 20  Temp: (!) 97.3 F (36.3 C)   SpO2: 99%      General: Awake, no distress.  CV:  Good peripheral perfusion.  Resp:  Normal effort.  Abd:  No distention.  Other:  2+ edema bilaterally no calf tenderness..  Increased work of breathing.   ED Results / Procedures / Treatments   Labs (all labs ordered are listed, but only abnormal results are displayed) Labs Reviewed  CBC WITH DIFFERENTIAL/PLATELET - Abnormal; Notable for the following components:      Result Value   MCV 100.4 (*)    All other components within normal limits  RESP PANEL BY RT-PCR (RSV, FLU A&B, COVID)  RVPGX2  COMPREHENSIVE METABOLIC PANEL  BRAIN NATRIURETIC PEPTIDE  TROPONIN I (HIGH SENSITIVITY)     EKG  My interpretation of EKG:  Sinus tachycardia rate of 107 without any ST elevations or T wave inversions, occasional PVCs  with left bundle branch block  RADIOLOGY I have reviewed the xray personally and interpreted patient has some left lower lobe atelectasis versus pneumonia  PROCEDURES:  Critical Care performed: No  .1-3 Lead EKG Interpretation  Performed by: Vanessa Pipestone, MD Authorized by: Vanessa Wolford, MD     Interpretation: abnormal     ECG rate:  107   ECG rate assessment: tachycardic     Rhythm: sinus tachycardia     Ectopy: PVCs     Conduction: normal      MEDICATIONS ORDERED IN ED: Medications  furosemide (LASIX) injection 80 mg (has no administration in time range)  cefTRIAXone (ROCEPHIN) 2 g in sodium chloride 0.9 % 100 mL IVPB (has no administration in time range)  azithromycin (ZITHROMAX) tablet 500 mg (has no administration in time range)     IMPRESSION / MDM / ASSESSMENT AND PLAN / ED COURSE  I reviewed the triage vital signs and the nursing notes.   Patient's presentation is most consistent with severe exacerbation of chronic illness.   Patient comes in with tachycardia, shortness of breath concerning for CHF exacerbation on examination.  Low suspicion for COVID, flu, ACS denies any chest pain.  Consider PE but seems less likely given his extensive fluid overloaded on exam.  CBC is reassuring. Troponin slightly elevated but stable.  CMP slightly elevated LFTs but he had no right upper quadrant pain.  Could be hepatic congestion -did alert patient of these findings. BNP is elevated  Chest x-ray possible pneumonia versus atelectasis.  Will add on procalcitonin given dose of antibiotics while waiting.  Given patient's work of breathing with minimal exertion seen with EMS and when he was initially here I think patient should be admitted for IV diuresis.  Discussed this with patient and he was willing to do this.  The patient is on the cardiac monitor to evaluate for evidence of arrhythmia and/or significant heart rate changes.      FINAL CLINICAL IMPRESSION(S) / ED  DIAGNOSES   Final diagnoses:  Acute on chronic congestive heart failure, unspecified heart failure type ([place])  Shortness of breath     Rx / DC Orders   ED Discharge Orders     None        Note:  This document was prepared using Dragon voice recognition software and may include unintentional dictation errors.   Vanessa [name], MD 11/18/22 [phone]
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
## 2022-11-18 NOTE — Assessment & Plan Note (Signed)
Patient being started on Lasix I do anticipate that this will improve somewhat with Lasix infusion.  May be due to tachypnea, monitor at this time

## 2022-11-18 NOTE — Assessment & Plan Note (Signed)
Associated with pleural effusion.  At this time procalcitonin level is pending my clinical suspicion of patient having a bacterial pneumonia is low, patient is s/p ceftriaxone and azithromycin in the ER, at this time hold off further antibiotic therapy and monitor clinical response to diuresis

## 2022-11-18 NOTE — ED Triage Notes (Signed)
Pt arrived from dr office for shortness of breath and edema. Pt has pitting edema to all extremities. Pt abdomen is extended. Pt has been non compliant with meds x4 months. Pt says he stopped taking his lasix because it makes his penis sore from peeing so much. Upon EMS arrival pt was 80% on room air. Pt tachypneic and says he can't lie down. Pt is A&Ox4. Pt has below elbow amputation on right arm. Pt has a tourniquet on right arm and states "It helps balance it out".

## 2022-11-19 ENCOUNTER — Inpatient Hospital Stay (HOSPITAL_COMMUNITY)
Admit: 2022-11-19 | Discharge: 2022-11-19 | Disposition: A | Discharge status: Home / Self Care | Payer: 59 | Attending: Internal Medicine | Admitting: Internal Medicine

## 2022-11-19 DIAGNOSIS — R778 Other specified abnormalities of plasma proteins: Secondary | ICD-10-CM

## 2022-11-19 DIAGNOSIS — I5023 Acute on chronic systolic (congestive) heart failure: Secondary | ICD-10-CM

## 2022-11-19 DIAGNOSIS — I428 Other cardiomyopathies: Secondary | ICD-10-CM

## 2022-11-19 DIAGNOSIS — I509 Heart failure, unspecified: Secondary | ICD-10-CM

## 2022-11-19 LAB — CBC
HCT: 44 % (ref 39.0–52.0)
Hemoglobin: 13.6 g/dL (ref 13.0–17.0)
MCH: 30.7 pg (ref 26.0–34.0)
MCHC: 30.9 g/dL (ref 30.0–36.0)
MCV: 99.3 fL (ref 80.0–100.0)
Platelets: 141 10*3/uL — ABNORMAL LOW (ref 150–400)
RBC: 4.43 MIL/uL (ref 4.22–5.81)
RDW: 13.2 % (ref 11.5–15.5)
WBC: 5.8 10*3/uL (ref 4.0–10.5)
nRBC: 0 % (ref 0.0–0.2)

## 2022-11-19 LAB — COMPREHENSIVE METABOLIC PANEL
ALT: 42 U/L (ref 0–44)
AST: 39 U/L (ref 15–41)
Albumin: 3.1 g/dL — ABNORMAL LOW (ref 3.5–5.0)
Alkaline Phosphatase: 80 U/L (ref 38–126)
Anion gap: 8 (ref 5–15)
BUN: 22 mg/dL (ref 8–23)
CO2: 21 mmol/L — ABNORMAL LOW (ref 22–32)
Calcium: 8.9 mg/dL (ref 8.9–10.3)
Chloride: 108 mmol/L (ref 98–111)
Creatinine, Ser: 1.13 mg/dL (ref 0.61–1.24)
GFR, Estimated: >60 mL/min (ref >60)
Glucose, Bld: 87 mg/dL (ref 70–99)
Potassium: 3.8 mmol/L (ref 3.5–5.1)
Sodium: 137 mmol/L (ref 135–145)
Total Bilirubin: 2.4 mg/dL — ABNORMAL HIGH (ref 0.3–1.2)
Total Protein: 6.3 g/dL — ABNORMAL LOW (ref 6.5–8.1)

## 2022-11-19 LAB — HEPATITIS PANEL, ACUTE
HCV Ab: NONREACTIVE
Hep A IgM: NONREACTIVE
Hep B C IgM: NONREACTIVE
Hepatitis B Surface Ag: NONREACTIVE

## 2022-11-19 LAB — PROTIME-INR
INR: 1.2 (ref 0.8–1.2)
Prothrombin Time: 15.1 seconds (ref 11.4–15.2)

## 2022-11-19 LAB — ECHOCARDIOGRAM COMPLETE
AR max vel: 2.23 cm2
AV Area VTI: 2.28 cm2
AV Area mean vel: 2.43 cm2
AV Mean grad: 2 mmHg
AV Peak grad: 4.1 mmHg
Ao pk vel: 1.01 m/s
Area-P 1/2: 5.06 cm2
Calc EF: 20.1 %
Height: 75 in
S' Lateral: 5.9 cm
Single Plane A2C EF: 28.9 %
Single Plane A4C EF: 10.1 %
Weight: 4144 oz

## 2022-11-19 LAB — MAGNESIUM: Magnesium: 1.6 mg/dL — ABNORMAL LOW (ref 1.7–2.4)

## 2022-11-19 LAB — APTT: aPTT: 29 seconds (ref 24–36)

## 2022-11-19 MED ORDER — FUROSEMIDE 10 MG/ML IJ SOLN
40.0000 mg | Freq: Two times a day (BID) | INTRAMUSCULAR | Status: DC
  Administered 2022-11-19 – 2022-11-20 (×4): 40 mg via INTRAVENOUS
  Filled 2022-11-19 (×5): qty 4

## 2022-11-19 MED ORDER — SODIUM CHLORIDE 0.9% FLUSH
3.0000 mL | Freq: Two times a day (BID) | INTRAVENOUS | Status: DC
  Administered 2022-11-19 – 2022-11-22 (×7): 3 mL via INTRAVENOUS

## 2022-11-19 MED ORDER — ASPIRIN 81 MG PO TBEC
81.0000 mg | DELAYED_RELEASE_TABLET | Freq: Every day | ORAL | Status: DC
  Administered 2022-11-19 – 2022-11-22 (×4): 81 mg via ORAL
  Filled 2022-11-19 (×4): qty 1

## 2022-11-19 MED ORDER — HYDRALAZINE HCL 20 MG/ML IJ SOLN: 10.0000 mg | INTRAMUSCULAR | Status: DC | PRN

## 2022-11-19 MED ORDER — ONDANSETRON HCL 4 MG/2ML IJ SOLN: 4.0000 mg | Freq: Four times a day (QID) | INTRAMUSCULAR | Status: DC | PRN

## 2022-11-19 MED ORDER — ACETAMINOPHEN 325 MG PO TABS
650.0000 mg | ORAL_TABLET | Freq: Four times a day (QID) | ORAL | Status: DC | PRN
  Administered 2022-11-19: 650 mg via ORAL
  Filled 2022-11-19: qty 2

## 2022-11-19 MED ORDER — TRAZODONE HCL 50 MG PO TABS: 50.0000 mg | ORAL_TABLET | Freq: Every evening | ORAL | Status: DC | PRN

## 2022-11-19 MED ORDER — LOSARTAN POTASSIUM 25 MG PO TABS
25.0000 mg | ORAL_TABLET | Freq: Every day | ORAL | Status: DC
  Administered 2022-11-19 – 2022-11-20 (×2): 25 mg via ORAL
  Filled 2022-11-19 (×2): qty 1

## 2022-11-19 MED ORDER — POLYETHYLENE GLYCOL 3350 17 G PO PACK: 17.0000 g | PACK | Freq: Every day | ORAL | Status: DC | PRN

## 2022-11-19 MED ORDER — ENOXAPARIN SODIUM 60 MG/0.6ML IJ SOSY
0.5000 mg/kg | PREFILLED_SYRINGE | INTRAMUSCULAR | Status: DC
  Administered 2022-11-19: 60 mg via SUBCUTANEOUS
  Filled 2022-11-19: qty 0.6

## 2022-11-19 MED ORDER — PERFLUTREN LIPID MICROSPHERE
1.0000 mL | INTRAVENOUS | Status: AC | PRN
  Administered 2022-11-19: 2 mL via INTRAVENOUS

## 2022-11-19 MED ORDER — METOPROLOL TARTRATE 5 MG/5ML IV SOLN: 5.0000 mg | INTRAVENOUS | Status: DC | PRN

## 2022-11-19 MED ORDER — SENNOSIDES-DOCUSATE SODIUM 8.6-50 MG PO TABS: 1.0000 | ORAL_TABLET | Freq: Every evening | ORAL | Status: DC | PRN

## 2022-11-19 MED ORDER — METOPROLOL SUCCINATE ER 25 MG PO TB24
12.5000 mg | ORAL_TABLET | Freq: Every day | ORAL | Status: DC
  Administered 2022-11-19 – 2022-11-20 (×2): 12.5 mg via ORAL
  Filled 2022-11-19 (×2): qty 1

## 2022-11-19 MED ORDER — MAGNESIUM OXIDE -MG SUPPLEMENT 400 (240 MG) MG PO TABS
800.0000 mg | ORAL_TABLET | Freq: Once | ORAL | Status: AC
  Administered 2022-11-19: 800 mg via ORAL
  Filled 2022-11-19: qty 2

## 2022-11-19 MED ORDER — IPRATROPIUM-ALBUTEROL 0.5-2.5 (3) MG/3ML IN SOLN: 3.0000 mL | RESPIRATORY_TRACT | Status: DC | PRN

## 2022-11-19 MED ORDER — GUAIFENESIN 100 MG/5ML PO LIQD: 5.0000 mL | ORAL | Status: DC | PRN

## 2022-11-19 MED ORDER — ATORVASTATIN CALCIUM 20 MG PO TABS
40.0000 mg | ORAL_TABLET | Freq: Every day | ORAL | Status: DC
  Administered 2022-11-19 – 2022-11-22 (×4): 40 mg via ORAL
  Filled 2022-11-19 (×4): qty 2

## 2022-11-19 MED ORDER — ACETAMINOPHEN 650 MG RE SUPP: 650.0000 mg | Freq: Four times a day (QID) | RECTAL | Status: DC | PRN

## 2022-11-19 MED ORDER — FUROSEMIDE 10 MG/ML IJ SOLN
10.0000 mg/h | INTRAVENOUS | Status: DC
  Administered 2022-11-19: 10 mg/h via INTRAVENOUS
  Filled 2022-11-19: qty 20

## 2022-11-19 NOTE — ED Notes (Signed)
Offered to change patients clothes and linens several times; Patient refused

## 2022-11-19 NOTE — Progress Notes (Signed)
*  PRELIMINARY RESULTS* Echocardiogram 2D Echocardiogram has been performed.  Sherrie Sport 11/19/2022, 1:16 PM

## 2022-11-19 NOTE — Progress Notes (Signed)
PHARMACIST - PHYSICIAN COMMUNICATION  CONCERNING:  Enoxaparin (Lovenox) for DVT Prophylaxis    RECOMMENDATION: Patient was prescribed enoxaprin '40mg'$  q24 hours for VTE prophylaxis.   Filed Weights   11/18/22 1614  Weight: 117.5 kg (259 lb)    Body mass index is 32.37 kg/m.  Estimated Creatinine Clearance: 83.1 mL/min (by C-G formula based on SCr of 1.11 mg/dL).   Based on [place] patient is candidate for enoxaparin 0.'5mg'$ /kg TBW SQ every 24 hours based on BMI being >30.  DESCRIPTION: Pharmacy has adjusted enoxaparin dose per [facility] policy.  Patient is now receiving enoxaparin 0.5 mg/kg every 24 hours   Renda Rolls, PharmD, [facility] 11/19/2022 5:10 AM
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
## 2022-11-19 NOTE — Consult Note (Signed)
Cardiology Consultation   Patient ID: Brenyn Patchett MRN: [id number]; DOB: 12/09/1950  Admit date: 11/18/2022 Date of Consult: 11/19/2022  PCP:  Juline Patch, MD   [facility] Prov[facility] Cardiologist:  Nelva Bush, MD   {  Patient Profile:   Gildo Latimore is a 72 y.o. male with a hx of systolic heart failure LVEF 25-30%, HTN, PVCs, atrial tachycardia, right arm amputation, HLD who is being seen 11/19/2022 for the evaluation of CHF at the request of Dr. Reesa Chew.  History of Present Illness:   Mr. Rish was seen by [facility] in 2022 during an admission for heart failure and elevated troponin. Echo showed LVEF 25-30%, moderate LVH. R/L heart cath was recommended, but patient declined. He was diuresed, also had some AKI. Patient was discharged and has since been following with [facility]. Patient was taking lasix '40mg'$  daily, Losartn '25mg'$  daily, Toprol '25mg'$  daily. He last saw Darylene Price in August 2023.  The patient presented to the ER at [facility] 11/18/22 with SOB and lower leg edema. Reported progressive symptoms the last 3 weeks. He saw his PCP earlier that day who recommended cardiology follow-up. He reported not taking his medications in 1-2 months. He said he stopped taking them because he felt bad. He noted a 26lbs weight gain was noted. He denies chest pain. Reports he follows a low salt diet. He lives with his sister.   In the ER BP 152/107, afebrile, 89bpm, RR 20. Labs showed K 4.2, Scr 1.11, BUN 23, albumin 3.3, AST 48, ALT 48, WBC 6.5, Hgb 14.5, Mag 1.6. BNP 1074. HS trop 38, 38. Resp panel negative. EKG showed ST with PVCs, LAD, IVCD, LVH, nonspecific T wave changes. CXR showed left lower lobe atelectasis vs PNA, small left effusion. He was started on IV lasix and admitted.   Past Medical History:  Diagnosis Date   Cardiomyopathy ([place])    a. 11/2020 Echo: EF 25-30%, mod LVH, Gr1 DD. Mild AoV Ca2+ w/ mild to mod AoV sclerosis.   CHF (congestive heart failure) ([place])     Hypertension     Past Surgical History:  Procedure Laterality Date   ARM AMPUTATION AT ELBOW Right    KNEE SURGERY       Home Medications:  Prior to Admission medications   Medication Sig Start Date End Date Taking? Authorizing Provider  atorvastatin (LIPITOR) 40 MG tablet Take 1 tablet (40 mg total) by mouth daily. TAKE 1 TABLET(40 MG) BY MOUTH DAILY Strength: 40 mg Patient not taking: Reported on 11/18/2022 06/13/22   Juline Patch, MD  furosemide (LASIX) 40 MG tablet TAKE 1 TABLET(40 MG) BY MOUTH DAILY Patient not taking: Reported on 11/18/2022 06/21/21   Alisa Graff, FNP  losartan (COZAAR) 25 MG tablet TAKE 1 TABLET(25 MG) BY MOUTH DAILY Patient not taking: Reported on 11/18/2022 12/20/21   Alisa Graff, FNP  metoprolol succinate (TOPROL-XL) 25 MG 24 hr tablet TAKE 1 TABLET(25 MG) BY MOUTH DAILY Patient not taking: Reported on 06/13/2022 06/21/21   Alisa Graff, FNP  nitroGLYCERIN (NITROSTAT) 0.4 MG SL tablet Place 1 tablet (0.4 mg total) under the tongue every 5 (five) minutes as needed for chest pain. Patient not taking: Reported on 01/23/2022 11/30/20   Debbe Odea, MD  potassium chloride (KLOR-CON) 10 MEQ tablet Take 1 tablet (10 mEq total) by mouth daily. Patient not taking: Reported on 07/02/2022 07/01/22   Alisa Graff, FNP  lisinopril (PRINIVIL,ZESTRIL) 10 MG tablet Take 1 tablet (10 mg total)  by mouth daily. 06/21/18 04/08/19  Norval Gable, MD    Inpatient Medications: Scheduled Meds:  atorvastatin  40 mg Oral Daily   furosemide  40 mg Intravenous BID   losartan  25 mg Oral Daily   magnesium oxide  800 mg Oral Once   metoprolol succinate  12.5 mg Oral Daily   sodium chloride flush  3 mL Intravenous Q12H   tamsulosin  0.4 mg Oral Daily   Continuous Infusions:  PRN Meds: acetaminophen **OR** acetaminophen, guaiFENesin, hydrALAZINE, ipratropium-albuterol, metoprolol tartrate, ondansetron (ZOFRAN) IV, polyethylene glycol, senna-docusate, traZODone  Allergies:    No Known Allergies  Social History:   Social History   Socioeconomic History   Marital status: Single    Spouse name: Not on file   Number of children: Not on file   Years of education: Not on file   Highest education level: Not on file  Occupational History   Occupation: Retired  Tobacco Use   Smoking status: Former    Types: Cigarettes    Quit date: 10/09/2017    Years since quitting: 5.1   Smokeless tobacco: Never  Vaping Use   Vaping Use: Never used  Substance and Sexual Activity   Alcohol use: No    Comment: quit 8 months ago   Drug use: No   Sexual activity: Not on file  Other Topics Concern   Not on file  Social History Narrative   Lives in [place] w/ his sister.  Does not routinely exercise.   Social Determinants of Health   Financial Resource Strain: Low Risk  (05/24/2022)   Overall Financial Resource Strain (CARDIA)    Difficulty of Paying Living Expenses: Not hard at all  Food Insecurity: No Food Insecurity (05/24/2022)   Hunger Vital Sign    Worried About Running Out of Food in the Last Year: Never true    Ran Out of Food in the Last Year: Never true  Transportation Needs: No Transportation Needs (05/24/2022)   PRAPARE - Hydrologist (Medical): No    Lack of Transportation (Non-Medical): No  Physical Activity: Inactive (05/24/2022)   Exercise Vital Sign    Days of Exercise per Week: 0 days    Minutes of Exercise per Session: 0 min  Stress: No Stress Concern Present (05/24/2022)   [place]    Feeling of Stress : Only a little  Social Connections: Socially Isolated (05/24/2022)   Social Connection and Isolation Panel [NHANES]    Frequency of Communication with Friends and Family: Never    Frequency of Social Gatherings with Friends and Family: Never    Attends Religious Services: Never    Marine scientist or Organizations: No    Attends Archivist  Meetings: Never    Marital Status: Never married  Intimate Partner Violence: Not At Risk (05/24/2022)   Humiliation, Afraid, Rape, and Kick questionnaire    Fear of Current or Ex-Partner: No    Emotionally Abused: No    Physically Abused: No    Sexually Abused: No    Family History:    Family History  Problem Relation Age of Onset   Diabetes Mother    Cirrhosis Father    Alcohol abuse Father    Other Brother        gsw   Cancer Brother        'head full of tumors'   Other Brother        '  killed w/ 2x4 to the head'   Other Sister        mva     ROS:  Please see the history of present illness.   All other ROS reviewed and negative.     Physical Exam/Data:   Vitals:   11/19/22 0900 11/19/22 0915 11/19/22 0923 11/19/22 0930  BP:   108/81   Pulse: 86 85  90  Resp: 19 (!) 24  14  Temp:   (!) 97.5 F (36.4 C)   TempSrc:   Oral   SpO2: 98% 99%  100%  Weight:      Height:        Intake/Output Summary (Last 24 hours) at 11/19/2022 0942 Last data filed at 11/19/2022 0844 Gross per 24 hour  Intake --  Output 3650 ml  Net -3650 ml      11/18/2022    4:14 PM 11/18/2022    2:52 PM 07/02/2022    8:21 AM  Last 3 Weights  Weight (lbs) 259 lb 261 lb 235 lb 4 oz  Weight (kg) 117.482 kg 118.389 kg 106.709 kg     Body mass index is 32.37 kg/m.  General:  Well nourished, well developed, in no acute distress HEENT: normal Neck: no JVD Vascular: No carotid bruits; Distal pulses 2+ bilaterally Cardiac:  normal S1, S2; RRR; no murmur  Lungs:  clear to auscultation bilaterally, no wheezing, rhonchi or rales  Abd: soft, nontender, no hepatomegaly  Ext: no edema Musculoskeletal:  No deformities, BUE and BLE strength normal and equal Skin: warm and dry  Neuro:  CNs 2-12 intact, no focal abnormalities noted Psych:  Normal affect   EKG:  The EKG was personally reviewed and demonstrates:  ST 107bpm, IVCD, LAD, LVH Telemetry:  Telemetry was personally reviewed and demonstrates:  NSR,  PVCs, NSVT, longest 38 beats  Relevant CV Studies:  Echo 11/2020  1. Left ventricular ejection fraction, by estimation, is 25 to 30%. The  left ventricle has severely decreased function. The left ventricle  demonstrates global hypokinesis. There is moderate left ventricular  hypertrophy. Left ventricular diastolic  parameters are consistent with Grade I diastolic dysfunction (impaired  relaxation). The average left ventricular global longitudinal strain is  -5.1 %. The global longitudinal strain is abnormal.   2. Right ventricular systolic function was not well visualized. The right  ventricular size is not well visualized. Tricuspid regurgitation signal is  inadequate for assessing PA pressure.   3. The mitral valve is grossly normal. No evidence of mitral valve  regurgitation. No evidence of mitral stenosis.   4. The aortic valve has an indeterminant number of cusps. There is mild  calcification of the aortic valve. There is mild thickening of the aortic  valve. Aortic valve regurgitation is not visualized. Mild to moderate  aortic valve sclerosis/calcification  is present, without any evidence of aortic stenosis.    Laboratory Data:  High Sensitivity Troponin:   Recent Labs  Lab 11/18/22 1619 11/18/22 1735  TROPONINIHS 38* 38*     Chemistry Recent Labs  Lab 11/18/22 1735 11/19/22 0639  NA 137 137  K 4.2 3.8  CL 108 108  CO2 18* 21*  GLUCOSE 91 87  BUN 23 22  CREATININE 1.11 1.13  CALCIUM 9.0 8.9  MG  --  1.6*  GFRNONAA >60 >60  ANIONGAP 11 8    Recent Labs  Lab 11/18/22 1735 11/19/22 0639  PROT 6.7 6.3*  ALBUMIN 3.3* 3.1*  AST 48* 39  ALT 48*  42  ALKPHOS 92 80  BILITOT 2.3* 2.4*   Lipids No results for input(s): "CHOL", "TRIG", "HDL", "LABVLDL", "LDLCALC", "CHOLHDL" in the last 168 hours.  Hematology Recent Labs  Lab 11/18/22 1619 11/19/22 0639  WBC 6.5 5.8  RBC 4.73 4.43  HGB 14.5 13.6  HCT 47.5 44.0  MCV 100.4* 99.3  MCH 30.7 30.7  MCHC  30.5 30.9  RDW 13.3 13.2  PLT 162 141*   Thyroid No results for input(s): "TSH", "FREET4" in the last 168 hours.  BNP Recent Labs  Lab 11/18/22 1619  BNP 1,074.7*    DDimer No results for input(s): "DDIMER" in the last 168 hours.   Radiology/Studies:  DG Chest Portable 1 View  Result Date: 11/18/2022 CLINICAL DATA:  sob EXAM: PORTABLE CHEST 1 VIEW COMPARISON:  11/27/2020 FINDINGS: Mild cardiac enlargement with slight central vascular prominence. No definite CHF pattern. Right lung remains clear. Increased left lower lobe opacity obscures left hemidiaphragm suspicious for atelectasis versus pneumonia. Difficult to exclude small left effusion. No pneumothorax. Trachea midline. No acute osseous finding. IMPRESSION: 1. Left lower lobe atelectasis versus pneumonia. 2. Suspect small left effusion. Electronically Signed   By: Jerilynn Mages.  Shick M.D.   On: 11/18/2022 16:35     Assessment and Plan:   Acute on chronic systolic heart failure - presented with SOB, LLE, orthopnea or pnd in the setting of medication non-compliance - patient has been following with Darylene Price  - on admission BNP>1000 and CXR showed left lower lobe atelectasis and small left effusion - started on IV lasix '40mg'$  BID - Net -3.6L - continue Losartan '25mg'$  daily, Toprol 12.'5mg'$  daily,  - saw [facility] in 2022 for new heart failure and patient declined R/L heart cath at that time. - repeat echo - continue with diuresis, patient is not wanting to pursue cath at this time  Elevated troponin - Hs trop 38>38 - no chest pain reported - patient previously declined heart cath - repeat echo - continue ASA '81mg'$  daily, Lipitor '40mg'$  daily, and Toprol - check lipids and A1C - not wanting to pursue cardiac cath as above  Elevated LFTs - likely related to congestion from CHF  NSVT - continue BB therapy   For questions or updates, please contact [place] Please consult [email or web address] for contact info under     Signed, Saya Mccoll Ninfa Meeker, PA-C  11/19/2022 9:42 AM

## 2022-11-19 NOTE — ED Notes (Signed)
Bladder scan complete. 5m highest recorded.

## 2022-11-19 NOTE — Progress Notes (Signed)
PROGRESS NOTE    Scott Meyer  [id number] DOB: 26-Oct-1950 DOA: 11/18/2022 PCP: Juline Patch, MD   Brief Narrative:  72 year old with congestive heart failure with reduced ejection fraction, HTN comes to the hospital with shortness of breath, increasing lower extremity swelling.  He was diagnosed with CHF exacerbation and admitted to the hospital.   Assessment & Plan:  Principal Problem:   Anasarca Active Problems:   Pulmonary edema   Amputation of right arm (HCC)   Lower urinary tract symptoms (LUTS)   Acidosis   LFT elevation   Troponin I above reference range     Assessment and Plan: * Anasarca Acute congestive heart failure with reduced ejection fraction, 25%, class IV Pulmonary edema with small left-sided pleural effusion Significant volume overload secondary to diuretic noncompliance.  Previously he has declined ischemic evaluation.  Will consult cardiology for GDMT management, and follow-up Aggressive diuretics-Lasix IV BID - Elevated BNP, procalcitonin negative. - Will update echocardiogram  Amputation of right arm ([name]) Secondary to trauma in 1970s fall precuations  Transaminitis, improved Likely viral hepatic congestion.  CK levels are normal  Metabolic acidosis From poor perfusion, bicarb improving  Lower urinary tract symptoms (LUTS) UA is unremarkable       DVT prophylaxis: Lovenox Code Status: Full Family Communication:    Status is: Inpatient On going CHF management.   Subjective: Admits of medication noncompliance and poor follow up.  No other complaints.    Examination:  General exam: Appears calm and comfortable  Respiratory system: bibasilar crackles.  Cardiovascular system: S1 & S2 heard, RRR. No JVD, murmurs, rubs, gallops or clicks. 3+ pitting edema.  Gastrointestinal system: Abdomen is nondistended, soft and nontender. No organomegaly or masses felt. Normal bowel sounds heard. Central nervous system: Alert and oriented. No  focal neurological deficits. Extremities: Symmetric 5 x 5 power. Skin: No rashes, lesions or ulcers Psychiatry: Judgement and insight appear normal. Mood & affect appropriate.     Objective: Vitals:   11/19/22 0508 11/19/22 0630 11/19/22 0700 11/19/22 0730  BP:  (!) 144/80 115/77 124/60  Pulse:  90 74 84  Resp:  (!) '22 19 20  '$ Temp: 98.1 F (36.7 C)     TempSrc: Oral Oral    SpO2:  99%    Weight:      Height:        Intake/Output Summary (Last 24 hours) at 11/19/2022 0907 Last data filed at 11/19/2022 0844 Gross per 24 hour  Intake --  Output 3650 ml  Net -3650 ml   Filed Weights   11/18/22 1614  Weight: 117.5 kg     Data Reviewed:   CBC: Recent Labs  Lab 11/18/22 1619 11/19/22 0639  WBC 6.5 5.8  NEUTROABS 4.3  --   HGB 14.5 13.6  HCT 47.5 44.0  MCV 100.4* 99.3  PLT 162 [id number]*   Basic Metabolic Panel: Recent Labs  Lab 11/18/22 1735 11/19/22 0639  NA 137 137  K 4.2 3.8  CL 108 108  CO2 18* 21*  GLUCOSE 91 87  BUN 23 22  CREATININE 1.11 1.13  CALCIUM 9.0 8.9  MG  --  1.6*   GFR: Estimated Creatinine Clearance: 81.7 mL/min (by C-G formula based on SCr of 1.13 mg/dL). Liver Function Tests: Recent Labs  Lab 11/18/22 1735 11/19/22 0639  AST 48* 39  ALT 48* 42  ALKPHOS 92 80  BILITOT 2.3* 2.4*  PROT 6.7 6.3*  ALBUMIN 3.3* 3.1*   No results for input(s): "LIPASE", "AMYLASE" in the  last 168 hours. No results for input(s): "AMMONIA" in the last 168 hours. Coagulation Profile: Recent Labs  Lab 11/19/22 0639  INR 1.2   Cardiac Enzymes: Recent Labs  Lab 11/18/22 1743  CKTOTAL 296   BNP (last 3 results) No results for input(s): "PROBNP" in the last 8760 hours. HbA1C: No results for input(s): "HGBA1C" in the last 72 hours. CBG: No results for input(s): "GLUCAP" in the last 168 hours. Lipid Profile: No results for input(s): "CHOL", "HDL", "LDLCALC", "TRIG", "CHOLHDL", "LDLDIRECT" in the last 72 hours. Thyroid Function Tests: No results for  input(s): "TSH", "T4TOTAL", "FREET4", "T3FREE", "THYROIDAB" in the last 72 hours. Anemia Panel: No results for input(s): "VITAMINB12", "FOLATE", "FERRITIN", "TIBC", "IRON", "RETICCTPCT" in the last 72 hours. Sepsis Labs: Recent Labs  Lab 11/18/22 1938  PROCALCITON <0.10    Recent Results (from the past 240 hour(s))  Resp panel by RT-PCR (RSV, Flu A&B, Covid) Anterior Nasal Swab     Status: None   Collection Time: 11/18/22  4:19 PM   Specimen: Anterior Nasal Swab  Result Value Ref Range Status   SARS Coronavirus 2 by RT PCR NEGATIVE NEGATIVE Final    Comment: (NOTE) SARS-CoV-2 target nucleic acids are NOT DETECTED.  The SARS-CoV-2 RNA is generally detectable in upper respiratory specimens during the acute phase of infection. The lowest concentration of SARS-CoV-2 viral copies this assay can detect is 138 copies/mL. A negative result does not preclude SARS-Cov-2 infection and should not be used as the sole basis for treatment or other patient management decisions. A negative result may occur with  improper specimen collection/handling, submission of specimen other than nasopharyngeal swab, presence of viral mutation(s) within the areas targeted by this assay, and inadequate number of viral copies(<138 copies/mL). A negative result must be combined with clinical observations, patient history, and epidemiological information. The expected result is Negative.  Fact Sheet for Patients:  EntrepreneurPulse.com.au  Fact Sheet for Healthcare Providers:  IncredibleEmployment.be  This test is no t yet approved or cleared by the [place] FDA and  has been authorized for detection and/or diagnosis of SARS-CoV-2 by FDA under an Emergency Use Authorization (EUA). This EUA will remain  in effect (meaning this test can be used) for the duration of the COVID-19 declaration under Section 564(b)(1) of the Act, 21 U.S.C.section 360bbb-3(b)(1), unless the  authorization is terminated  or revoked sooner.       Influenza A by PCR NEGATIVE NEGATIVE Final   Influenza B by PCR NEGATIVE NEGATIVE Final    Comment: (NOTE) The Xpert Xpress SARS-CoV-2/FLU/RSV plus assay is intended as an aid in the diagnosis of influenza from Nasopharyngeal swab specimens and should not be used as a sole basis for treatment. Nasal washings and aspirates are unacceptable for Xpert Xpress SARS-CoV-2/FLU/RSV testing.  Fact Sheet for Patients: EntrepreneurPulse.com.au  Fact Sheet for Healthcare Providers: IncredibleEmployment.be  This test is not yet approved or cleared by the [place] FDA and has been authorized for detection and/or diagnosis of SARS-CoV-2 by FDA under an Emergency Use Authorization (EUA). This EUA will remain in effect (meaning this test can be used) for the duration of the COVID-19 declaration under Section 564(b)(1) of the Act, 21 U.S.C. section 360bbb-3(b)(1), unless the authorization is terminated or revoked.     Resp Syncytial Virus by PCR NEGATIVE NEGATIVE Final    Comment: (NOTE) Fact Sheet for Patients: EntrepreneurPulse.com.au  Fact Sheet for Healthcare Providers: IncredibleEmployment.be  This test is not yet approved or cleared by the [place] and  has been authorized for detection and/or diagnosis of SARS-CoV-2 by FDA under an Emergency Use Authorization (EUA). This EUA will remain in effect (meaning this test can be used) for the duration of the COVID-19 declaration under Section 564(b)(1) of the Act, 21 U.S.C. section 360bbb-3(b)(1), unless the authorization is terminated or revoked.  Performed at [facility], [address], [place], [place] [address]          Radiology Studies: DG Chest Portable 1 View  Result Date: 11/18/2022 CLINICAL DATA:  sob EXAM: PORTABLE CHEST 1 VIEW COMPARISON:  11/27/2020 FINDINGS: Mild  cardiac enlargement with slight central vascular prominence. No definite CHF pattern. Right lung remains clear. Increased left lower lobe opacity obscures left hemidiaphragm suspicious for atelectasis versus pneumonia. Difficult to exclude small left effusion. No pneumothorax. Trachea midline. No acute osseous finding. IMPRESSION: 1. Left lower lobe atelectasis versus pneumonia. 2. Suspect small left effusion. Electronically Signed   By: Jerilynn Mages.  Shick M.D.   On: 11/18/2022 16:35        Scheduled Meds:  atorvastatin  40 mg Oral Daily   enoxaparin (LOVENOX) injection  0.5 mg/kg Subcutaneous Q24H   losartan  25 mg Oral Daily   metoprolol succinate  12.5 mg Oral Daily   sodium chloride flush  3 mL Intravenous Q12H   tamsulosin  0.4 mg Oral Daily   Continuous Infusions:  furosemide (LASIX) 200 mg in dextrose 5 % 100 mL (2 mg/mL) infusion 10 mg/hr (11/19/22 0639)     LOS: 1 day   Time spent= 35 mins    Tawny Raspberry Arsenio Loader, MD [facility]  If 7PM-7AM, please contact night-coverage  11/19/2022, 9:07 AM

## 2022-11-19 NOTE — Discharge Instructions (Signed)

## 2022-11-20 ENCOUNTER — Other Ambulatory Visit (HOSPITAL_COMMUNITY): Payer: Self-pay

## 2022-11-20 DIAGNOSIS — I509 Heart failure, unspecified: Secondary | ICD-10-CM

## 2022-11-20 LAB — BASIC METABOLIC PANEL
Anion gap: 8 (ref 5–15)
BUN: 23 mg/dL (ref 8–23)
CO2: 25 mmol/L (ref 22–32)
Calcium: 8.9 mg/dL (ref 8.9–10.3)
Chloride: 106 mmol/L (ref 98–111)
Creatinine, Ser: 1.16 mg/dL (ref 0.61–1.24)
GFR, Estimated: >60 mL/min (ref >60)
Glucose, Bld: 121 mg/dL — ABNORMAL HIGH (ref 70–99)
Potassium: 3.5 mmol/L (ref 3.5–5.1)
Sodium: 139 mmol/L (ref 135–145)

## 2022-11-20 LAB — LIPID PANEL
Cholesterol: 115 mg/dL (ref 0–200)
HDL: 27 mg/dL — ABNORMAL LOW (ref >40)
LDL Cholesterol: 79 mg/dL (ref 0–99)
Total CHOL/HDL Ratio: 4.3 RATIO
Triglycerides: 45 mg/dL (ref <150)
VLDL: 9 mg/dL (ref 0–40)

## 2022-11-20 LAB — CBC
HCT: 38.1 % — ABNORMAL LOW (ref 39.0–52.0)
Hemoglobin: 12.3 g/dL — ABNORMAL LOW (ref 13.0–17.0)
MCH: 30.6 pg (ref 26.0–34.0)
MCHC: 32.3 g/dL (ref 30.0–36.0)
MCV: 94.8 fL (ref 80.0–100.0)
Platelets: 132 10*3/uL — ABNORMAL LOW (ref 150–400)
RBC: 4.02 MIL/uL — ABNORMAL LOW (ref 4.22–5.81)
RDW: 13 % (ref 11.5–15.5)
WBC: 5.4 10*3/uL (ref 4.0–10.5)
nRBC: 0 % (ref 0.0–0.2)

## 2022-11-20 LAB — MAGNESIUM: Magnesium: 1.7 mg/dL (ref 1.7–2.4)

## 2022-11-20 MED ORDER — POTASSIUM CHLORIDE CRYS ER 20 MEQ PO TBCR
40.0000 meq | EXTENDED_RELEASE_TABLET | Freq: Once | ORAL | Status: AC
  Administered 2022-11-20: 40 meq via ORAL
  Filled 2022-11-20: qty 2

## 2022-11-20 MED ORDER — SACUBITRIL-VALSARTAN 24-26 MG PO TABS
1.0000 | ORAL_TABLET | Freq: Two times a day (BID) | ORAL | Status: DC
  Administered 2022-11-21 – 2022-11-22 (×3): 1 via ORAL
  Filled 2022-11-20 (×3): qty 1

## 2022-11-20 MED ORDER — METOPROLOL SUCCINATE ER 25 MG PO TB24
25.0000 mg | ORAL_TABLET | Freq: Every day | ORAL | Status: DC
  Administered 2022-11-21 – 2022-11-22 (×2): 25 mg via ORAL
  Filled 2022-11-20 (×2): qty 1

## 2022-11-20 MED ORDER — MAGNESIUM SULFATE 2 GM/50ML IV SOLN
2.0000 g | Freq: Once | INTRAVENOUS | Status: AC
  Administered 2022-11-20: 2 g via INTRAVENOUS
  Filled 2022-11-20: qty 50

## 2022-11-20 NOTE — TOC Benefit Eligibility Note (Addendum)
Patient Teacher, English as a foreign language completed.    The patient is currently admitted and upon discharge could be taking Entresto 24-26 mg.  The current 30 day co-pay is $0.00.   The patient is currently admitted and upon discharge could be taking Farxiga 10 mg.  The current 30 day co-pay is $0.00.   The patient is currently admitted and upon discharge could be taking Jardiance 10 mg.  The current 30 day co-pay is $0.00.   The patient is insured through [employer] [place] Patient Advocate Specialist [place] Patient Advocate Team Direct Number: [phone]  Fax: [phone]

## 2022-11-20 NOTE — Progress Notes (Signed)
Mobility Specialist - Progress Note   11/20/22 1000  Mobility  Activity Ambulated independently in hallway  Level of Assistance Independent  Assistive Device None  Distance Ambulated (ft) 180 ft  Activity Response Tolerated well  Mobility Referral Yes  $Mobility charge 1 Mobility   Pt sitting EOB on RA upon arrival. Pt STS and ambulates in hallway indep. Pt returns to bed with needs in reach.   Gretchen Short  Mobility Specialist  11/20/22 10:00 AM

## 2022-11-20 NOTE — Progress Notes (Signed)
Rounding Note    Patient Name: Scott Meyer Date of Encounter: 11/20/2022  [place] Cardiologist: Nelva Bush, MD   Subjective   Patient is overall doing well. UOP -1.8L. Breathing is improving.   Inpatient Medications    Scheduled Meds:  aspirin EC  81 mg Oral Daily   atorvastatin  40 mg Oral Daily   furosemide  40 mg Intravenous BID   losartan  25 mg Oral Daily   metoprolol succinate  12.5 mg Oral Daily   sodium chloride flush  3 mL Intravenous Q12H   tamsulosin  0.4 mg Oral Daily   Continuous Infusions:  PRN Meds: acetaminophen **OR** acetaminophen, guaiFENesin, hydrALAZINE, ipratropium-albuterol, metoprolol tartrate, ondansetron (ZOFRAN) IV, polyethylene glycol, senna-docusate, traZODone   Vital Signs    Vitals:   11/20/22 0004 11/20/22 0500 11/20/22 0533 11/20/22 0756  BP: 122/83  118/76 124/81  Pulse: 85  82 84  Resp: '18  18 16  '$ Temp: 97.6 F (36.4 C)  97.7 F (36.5 C) 97.7 F (36.5 C)  TempSrc:   Oral Oral  SpO2: 100%  99% 100%  Weight:  110.2 kg    Height:        Intake/Output Summary (Last 24 hours) at 11/20/2022 0922 Last data filed at 11/20/2022 0500 Gross per 24 hour  Intake 182.6 ml  Output 1250 ml  Net -1067.4 ml      11/20/2022    5:00 AM 11/18/2022    4:14 PM 11/18/2022    2:52 PM  Last 3 Weights  Weight (lbs) 242 lb 15.2 oz 259 lb 261 lb  Weight (kg) 110.2 kg 117.482 kg 118.389 kg      Telemetry    NSR, HR 70s, NSVT 7 beats - Personally Reviewed  ECG    No new - Personally Reviewed  Physical Exam   GEN: No acute distress.   Neck: No JVD Cardiac: RRR, no murmurs, rubs, or gallops.  Respiratory: diffusely diminished. GI: Soft, nontender, non-distended  MS: 2+ lower extremity edema; No deformity. Neuro:  Nonfocal  Psych: Normal affect   Labs    High Sensitivity Troponin:   Recent Labs  Lab 11/18/22 1619 11/18/22 1735  TROPONINIHS 38* 38*     Chemistry Recent Labs  Lab 11/18/22 1735 11/19/22 0639  11/20/22 0145  NA 137 137 139  K 4.2 3.8 3.5  CL 108 108 106  CO2 18* 21* 25  GLUCOSE 91 87 121*  BUN '23 22 23  '$ CREATININE 1.11 1.13 1.16  CALCIUM 9.0 8.9 8.9  MG  --  1.6* 1.7  PROT 6.7 6.3*  --   ALBUMIN 3.3* 3.1*  --   AST 48* 39  --   ALT 48* 42  --   ALKPHOS 92 80  --   BILITOT 2.3* 2.4*  --   GFRNONAA >60 >60 >60  ANIONGAP '11 8 8    '$ Lipids  Recent Labs  Lab 11/20/22 0145  CHOL 115  TRIG 45  HDL 27*  LDLCALC 79  CHOLHDL 4.3    Hematology Recent Labs  Lab 11/18/22 1619 11/19/22 0639 11/20/22 0145  WBC 6.5 5.8 5.4  RBC 4.73 4.43 4.02*  HGB 14.5 13.6 12.3*  HCT 47.5 44.0 38.1*  MCV 100.4* 99.3 94.8  MCH 30.7 30.7 30.6  MCHC 30.5 30.9 32.3  RDW 13.3 13.2 13.0  PLT 162 141* 132*   Thyroid No results for input(s): "TSH", "FREET4" in the last 168 hours.  BNP Recent Labs  Lab 11/18/22 1619  BNP 1,074.7*    DDimer No results for input(s): "DDIMER" in the last 168 hours.   Radiology    ECHOCARDIOGRAM COMPLETE  Result Date: 11/19/2022    ECHOCARDIOGRAM REPORT   Patient Name:   Scott Meyer Date of Exam: 11/19/2022 Medical Rec #:  [id number]     Height:       75.0 in Accession #:    [id number]    Weight:       259.0 lb Date of Birth:  [birth year]      BSA:          2.449 m Patient Age:    72 years      BP:           120/74 mmHg Patient Gender: M             HR:           80 bpm. Exam Location:  [facility] Procedure: 2D Echo, Cardiac Doppler, Color Doppler and Intracardiac            Opacification Agent Indications:     Cardiomyopathy-unspecified I42.9  History:         Patient has prior history of Echocardiogram examinations, most                  recent 11/28/2020. Cardiomyopathy and CHF; Risk                  Factors:Hypertension.  Sonographer:     Sherrie Sport Referring Phys:  [id number] Jeanella Flattery AMIN Diagnosing Phys: Nelva Bush MD  Sonographer Comments: Suboptimal apical window. IMPRESSIONS  1. Left ventricular ejection fraction, by estimation, is 20 to 25%. The left  ventricle has severely decreased function. The left ventricle demonstrates global hypokinesis. The left ventricular internal cavity size was moderately dilated. There is mild left ventricular hypertrophy. Left ventricular diastolic parameters are consistent with Grade II diastolic dysfunction (pseudonormalization). Elevated left atrial pressure.  2. Right ventricular systolic function is mildly reduced. The right ventricular size is mildly enlarged. Tricuspid regurgitation signal is inadequate for assessing PA pressure.  3. Moderate pleural effusion in the left lateral region.  4. The mitral valve is abnormal. Mild to moderate mitral valve regurgitation. No evidence of mitral stenosis.  5. The aortic valve has an indeterminant number of cusps. There is mild calcification of the aortic valve. There is mild thickening of the aortic valve. Aortic valve regurgitation is not visualized. Aortic valve sclerosis/calcification is present, without any evidence of aortic stenosis.  6. Pulmonic valve regurgitation not well assessed. FINDINGS  Left Ventricle: Left ventricular ejection fraction, by estimation, is 20 to 25%. The left ventricle has severely decreased function. The left ventricle demonstrates global hypokinesis. Definity contrast agent was given IV to delineate the left ventricular endocardial borders. The left ventricular internal cavity size was moderately dilated. There is mild left ventricular hypertrophy. Left ventricular diastolic parameters are consistent with Grade II diastolic dysfunction (pseudonormalization).  Elevated left atrial pressure. Right Ventricle: The right ventricular size is mildly enlarged. No increase in right ventricular wall thickness. Right ventricular systolic function is mildly reduced. Tricuspid regurgitation signal is inadequate for assessing PA pressure. Left Atrium: Left atrial size was normal in size. Right Atrium: Right atrial size was normal in size. Pericardium: The pericardium  was not well visualized. Mitral Valve: The mitral valve is abnormal. There is mild thickening of the mitral valve leaflet(s). Mild to moderate mitral valve regurgitation. No evidence of mitral valve stenosis. Tricuspid Valve: The tricuspid valve  is not well visualized. Tricuspid valve regurgitation is trivial. Aortic Valve: The aortic valve has an indeterminant number of cusps. There is mild calcification of the aortic valve. There is mild thickening of the aortic valve. Aortic valve regurgitation is not visualized. Aortic valve sclerosis/calcification is present, without any evidence of aortic stenosis. Aortic valve mean gradient measures 2.0 mmHg. Aortic valve peak gradient measures 4.1 mmHg. Aortic valve area, by VTI measures 2.28 cm. Pulmonic Valve: The pulmonic valve was not well visualized. Pulmonic valve regurgitation not well assessed. Aorta: The aortic root is normal in size and structure. Pulmonary Artery: The pulmonary artery is not well seen. Venous: The inferior vena cava was not well visualized. IAS/Shunts: The interatrial septum was not well visualized. Additional Comments: There is a moderate pleural effusion in the left lateral region.  LEFT VENTRICLE PLAX 2D LVIDd:         6.40 cm      Diastology LVIDs:         5.90 cm      LV e' medial:    2.72 cm/s LV PW:         1.20 cm      LV E/e' medial:  37.9 LV IVS:        1.20 cm      LV e' lateral:   15.40 cm/s LVOT diam:     2.20 cm      LV E/e' lateral: 6.7 LV SV:         37 LV SV Index:   15 LVOT Area:     3.80 cm  LV Volumes (MOD) LV vol d, MOD A2C: 284.0 ml LV vol d, MOD A4C: 199.0 ml LV vol s, MOD A2C: 202.0 ml LV vol s, MOD A4C: 179.0 ml LV SV MOD A2C:     82.0 ml LV SV MOD A4C:     199.0 ml LV SV MOD BP:      48.6 ml RIGHT VENTRICLE RV Basal diam:  5.40 cm RV Mid diam:    4.30 cm LEFT ATRIUM             Index        RIGHT ATRIUM           Index LA diam:        3.80 cm 1.55 cm/m   RA Area:     20.30 cm LA Vol (A2C):   93.7 ml 38.26 ml/m  RA  Volume:   68.90 ml  28.13 ml/m LA Vol (A4C):   35.8 ml 14.62 ml/m LA Biplane Vol: 62.6 ml 25.56 ml/m  AORTIC VALVE AV Area (Vmax):    2.23 cm AV Area (Vmean):   2.43 cm AV Area (VTI):     2.28 cm AV Vmax:           100.90 cm/s AV Vmean:          66.150 cm/s AV VTI:            0.163 m AV Peak Grad:      4.1 mmHg AV Mean Grad:      2.0 mmHg LVOT Vmax:         59.10 cm/s LVOT Vmean:        42.200 cm/s LVOT VTI:          0.098 m LVOT/AV VTI ratio: 0.60  AORTA Ao Root diam: 3.40 cm MITRAL VALVE MV Area (PHT): 5.06 cm     SHUNTS MV Decel Time: 150 msec  Systemic VTI:  0.10 m MV E velocity: 103.00 cm/s  Systemic Diam: 2.20 cm MV A velocity: 71.30 cm/s MV E/A ratio:  1.44 Nelva Bush MD Electronically signed by Nelva Bush MD Signature Date/Time: 11/19/2022/3:29:09 PM    Final    DG Chest Portable 1 View  Result Date: 11/18/2022 CLINICAL DATA:  sob EXAM: PORTABLE CHEST 1 VIEW COMPARISON:  11/27/2020 FINDINGS: Mild cardiac enlargement with slight central vascular prominence. No definite CHF pattern. Right lung remains clear. Increased left lower lobe opacity obscures left hemidiaphragm suspicious for atelectasis versus pneumonia. Difficult to exclude small left effusion. No pneumothorax. Trachea midline. No acute osseous finding. IMPRESSION: 1. Left lower lobe atelectasis versus pneumonia. 2. Suspect small left effusion. Electronically Signed   By: Jerilynn Mages.  Shick M.D.   On: 11/18/2022 16:35    Cardiac Studies   Echo 11/19/22  1. Left ventricular ejection fraction, by estimation, is 20 to 25%. The  left ventricle has severely decreased function. The left ventricle  demonstrates global hypokinesis. The left ventricular internal cavity size  was moderately dilated. There is mild  left ventricular hypertrophy. Left ventricular diastolic parameters are  consistent with Grade II diastolic dysfunction (pseudonormalization).  Elevated left atrial pressure.   2. Right ventricular systolic function is mildly  reduced. The right  ventricular size is mildly enlarged. Tricuspid regurgitation signal is  inadequate for assessing PA pressure.   3. Moderate pleural effusion in the left lateral region.   4. The mitral valve is abnormal. Mild to moderate mitral valve  regurgitation. No evidence of mitral stenosis.   5. The aortic valve has an indeterminant number of cusps. There is mild  calcification of the aortic valve. There is mild thickening of the aortic  valve. Aortic valve regurgitation is not visualized. Aortic valve  sclerosis/calcification is present,  without any evidence of aortic stenosis.   6. Pulmonic valve regurgitation not well assessed.   Echo 11/2020  1. Left ventricular ejection fraction, by estimation, is 25 to 30%. The  left ventricle has severely decreased function. The left ventricle  demonstrates global hypokinesis. There is moderate left ventricular  hypertrophy. Left ventricular diastolic  parameters are consistent with Grade I diastolic dysfunction (impaired  relaxation). The average left ventricular global longitudinal strain is  -5.1 %. The global longitudinal strain is abnormal.   2. Right ventricular systolic function was not well visualized. The right  ventricular size is not well visualized. Tricuspid regurgitation signal is  inadequate for assessing PA pressure.   3. The mitral valve is grossly normal. No evidence of mitral valve  regurgitation. No evidence of mitral stenosis.   4. The aortic valve has an indeterminant number of cusps. There is mild  calcification of the aortic valve. There is mild thickening of the aortic  valve. Aortic valve regurgitation is not visualized. Mild to moderate  aortic valve sclerosis/calcification  is present, without any evidence of aortic stenosis.   Patient Profile     72 y.o. male with a hx of systolic heart failure LVEF 25-30%, HTN, PVCs, atrial tachycardia, right arm amputation, HLD who is being seen 11/19/2022 for the  evaluation of CHF   Assessment & Plan    Acute on chronic systolic heart failure - presented with SOB, LLE, orthopnea or pnd in the setting of medication non-compliance - patient has been following with Darylene Price  - on admission BNP>1000 and CXR showed left lower lobe atelectasis and small left effusion - IV lasix '40mg'$  BID - Net -4.7L - continue  Losartan '25mg'$  daily, Toprol 12.'5mg'$  daily,  - saw [facility] in 2022 for new heart failure and patient declined R/L heart cath at that time. - repeat echo showed further reduced LVEF 20-25%, mild LVH, G2DD, mild to mod MR - continue with diuresis, patient is not wanting to pursue cath at this time, may consider in the future   Elevated troponin - Hs trop 38>38 - no chest pain reported - patient previously declined heart cath - repeat echo - continue ASA '81mg'$  daily, Lipitor '40mg'$  daily, and Toprol - LDL 79 - not wanting to pursue cardiac cath as above   Elevated LFTs - likely related to congestion from CHF   NSVT - continue BB therapy  For questions or updates, please contact [facility] Please consult [email or web address] for contact info under        Signed, Karolee Meloni Ninfa Meeker, PA-C  11/20/2022, 9:22 AM

## 2022-11-20 NOTE — Progress Notes (Addendum)
PROGRESS NOTE    Scott Meyer  [id number] DOB: May 20, 1951 DOA: 11/18/2022 PCP: Juline Patch, MD   Brief Narrative:  72 year old with congestive heart failure with reduced ejection fraction, HTN comes to the hospital with shortness of breath, increasing lower extremity swelling.  He was diagnosed with CHF exacerbation and admitted to the hospital.  Upon admission patient was started on diuretics, echocardiogram this admission shows EF of 20-25% with global hypokinesia and reduced RV function.  Cardiology team is following.   Assessment & Plan:  Principal Problem:   Anasarca Active Problems:   Pulmonary edema   Amputation of right arm (HCC)   Lower urinary tract symptoms (LUTS)   Acidosis   LFT elevation   Troponin I above reference range   Acute exacerbation of CHF (congestive heart failure) (HCC)     Assessment and Plan: * Anasarca Acute congestive heart failure with reduced ejection fraction, 25%, class IV Pulmonary edema with small left-sided pleural effusion Updated echocardiogram this admission shows EF of 20-25% with global hypokinesia.  Cardiology team is following, currently continuing IV diuresis and replating electrolytes as necessary.  Plan is for GDMT management.  Possibly will need to address ischemic workup prior to discharge but patient continues to decline. Entresto CoPay is 0 for him per pharmacy.  Lower extremity TED hose/compression stockings to help with the swelling  Hypokalemia/hypomagnesemia - Repletion  Amputation of right arm ([place]) Secondary to trauma in 1970s fall precuations  Transaminitis, improved Likely viral hepatic congestion.  CK levels are normal  Metabolic acidosis From poor perfusion, bicarb improving  Lower urinary tract symptoms (LUTS) UA is unremarkable    DVT prophylaxis: Lovenox Code Status: Full Family Communication:    Status is: Inpatient On going CHF management.   Subjective: Seen and examined at bedside.  Tells  me overnight his lower extremity swelling was better but this morning after walking it has increased again.  Shortness of breath has improved as well.  Continues to insist on declining any kind of ischemic evaluation.  Tells me if he does he will aim for this to be done in August due to his belief in horoscope  Examination: Constitutional: Not in acute distress Respiratory: Clear to auscultation bilaterally Cardiovascular: Normal sinus rhythm, no rubs Abdomen: Nontender nondistended good bowel sounds Musculoskeletal: 3 + b/l LE pitting edema Skin: No rashes seen Neurologic: CN 2-12 grossly intact.  And nonfocal Psychiatric: Normal judgment and insight. Alert and oriented x 3. Normal mood.    Objective: Vitals:   11/20/22 0004 11/20/22 0500 11/20/22 0533 11/20/22 0756  BP: 122/83  118/76 124/81  Pulse: 85  82 84  Resp: '18  18 16  '$ Temp: 97.6 F (36.4 C)  97.7 F (36.5 C) 97.7 F (36.5 C)  TempSrc:   Oral Oral  SpO2: 100%  99% 100%  Weight:  110.2 kg    Height:        Intake/Output Summary (Last 24 hours) at 11/20/2022 0849 Last data filed at 11/20/2022 0500 Gross per 24 hour  Intake 182.6 ml  Output 1250 ml  Net -1067.4 ml   Filed Weights   11/18/22 1614 11/20/22 0500  Weight: 117.5 kg 110.2 kg     Data Reviewed:   CBC: Recent Labs  Lab 11/18/22 1619 11/19/22 0639 11/20/22 0145  WBC 6.5 5.8 5.4  NEUTROABS 4.3  --   --   HGB 14.5 13.6 12.3*  HCT 47.5 44.0 38.1*  MCV 100.4* 99.3 94.8  PLT 162 141* 132*  Basic Metabolic Panel: Recent Labs  Lab 11/18/22 1735 11/19/22 0639 11/20/22 0145  NA 137 137 139  K 4.2 3.8 3.5  CL 108 108 106  CO2 18* 21* 25  GLUCOSE 91 87 121*  BUN '23 22 23  '$ CREATININE 1.11 1.13 1.16  CALCIUM 9.0 8.9 8.9  MG  --  1.6* 1.7   GFR: Estimated Creatinine Clearance: 77.2 mL/min (by C-G formula based on SCr of 1.16 mg/dL). Liver Function Tests: Recent Labs  Lab 11/18/22 1735 11/19/22 0639  AST 48* 39  ALT 48* 42  ALKPHOS 92 80   BILITOT 2.3* 2.4*  PROT 6.7 6.3*  ALBUMIN 3.3* 3.1*   No results for input(s): "LIPASE", "AMYLASE" in the last 168 hours. No results for input(s): "AMMONIA" in the last 168 hours. Coagulation Profile: Recent Labs  Lab 11/19/22 0639  INR 1.2   Cardiac Enzymes: Recent Labs  Lab 11/18/22 1743  CKTOTAL 296   BNP (last 3 results) No results for input(s): "PROBNP" in the last 8760 hours. HbA1C: No results for input(s): "HGBA1C" in the last 72 hours. CBG: No results for input(s): "GLUCAP" in the last 168 hours. Lipid Profile: Recent Labs    11/20/22 0145  CHOL 115  HDL 27*  LDLCALC 79  TRIG 45  CHOLHDL 4.3   Thyroid Function Tests: No results for input(s): "TSH", "T4TOTAL", "FREET4", "T3FREE", "THYROIDAB" in the last 72 hours. Anemia Panel: No results for input(s): "VITAMINB12", "FOLATE", "FERRITIN", "TIBC", "IRON", "RETICCTPCT" in the last 72 hours. Sepsis Labs: Recent Labs  Lab 11/18/22 1938  PROCALCITON <0.10    Recent Results (from the past 240 hour(s))  Resp panel by RT-PCR (RSV, Flu A&B, Covid) Anterior Nasal Swab     Status: None   Collection Time: 11/18/22  4:19 PM   Specimen: Anterior Nasal Swab  Result Value Ref Range Status   SARS Coronavirus 2 by RT PCR NEGATIVE NEGATIVE Final    Comment: (NOTE) SARS-CoV-2 target nucleic acids are NOT DETECTED.  The SARS-CoV-2 RNA is generally detectable in upper respiratory specimens during the acute phase of infection. The lowest concentration of SARS-CoV-2 viral copies this assay can detect is 138 copies/mL. A negative result does not preclude SARS-Cov-2 infection and should not be used as the sole basis for treatment or other patient management decisions. A negative result may occur with  improper specimen collection/handling, submission of specimen other than nasopharyngeal swab, presence of viral mutation(s) within the areas targeted by this assay, and inadequate number of viral copies(<138 copies/mL). A  negative result must be combined with clinical observations, patient history, and epidemiological information. The expected result is Negative.  Fact Sheet for Patients:  EntrepreneurPulse.com.au  Fact Sheet for Healthcare Providers:  IncredibleEmployment.be  This test is no t yet approved or cleared by the [place] FDA and  has been authorized for detection and/or diagnosis of SARS-CoV-2 by FDA under an Emergency Use Authorization (EUA). This EUA will remain  in effect (meaning this test can be used) for the duration of the COVID-19 declaration under Section 564(b)(1) of the Act, 21 U.S.C.section 360bbb-3(b)(1), unless the authorization is terminated  or revoked sooner.       Influenza A by PCR NEGATIVE NEGATIVE Final   Influenza B by PCR NEGATIVE NEGATIVE Final    Comment: (NOTE) The Xpert Xpress SARS-CoV-2/FLU/RSV plus assay is intended as an aid in the diagnosis of influenza from Nasopharyngeal swab specimens and should not be used as a sole basis for treatment. Nasal washings and aspirates are  unacceptable for Xpert Xpress SARS-CoV-2/FLU/RSV testing.  Fact Sheet for Patients: EntrepreneurPulse.com.au  Fact Sheet for Healthcare Providers: IncredibleEmployment.be  This test is not yet approved or cleared by the [place] FDA and has been authorized for detection and/or diagnosis of SARS-CoV-2 by FDA under an Emergency Use Authorization (EUA). This EUA will remain in effect (meaning this test can be used) for the duration of the COVID-19 declaration under Section 564(b)(1) of the Act, 21 U.S.C. section 360bbb-3(b)(1), unless the authorization is terminated or revoked.     Resp Syncytial Virus by PCR NEGATIVE NEGATIVE Final    Comment: (NOTE) Fact Sheet for Patients: EntrepreneurPulse.com.au  Fact Sheet for Healthcare  Providers: IncredibleEmployment.be  This test is not yet approved or cleared by the [place] FDA and has been authorized for detection and/or diagnosis of SARS-CoV-2 by FDA under an Emergency Use Authorization (EUA). This EUA will remain in effect (meaning this test can be used) for the duration of the COVID-19 declaration under Section 564(b)(1) of the Act, 21 U.S.C. section 360bbb-3(b)(1), unless the authorization is terminated or revoked.  Performed at [facility], [address] [place], [place] [address]          Radiology Studies: ECHOCARDIOGRAM COMPLETE  Result Date: 11/19/2022    ECHOCARDIOGRAM REPORT   Patient Name:   Scott CONTO Date of Exam: 11/19/2022 Medical Rec #:  [id number]     Height:       75.0 in Accession #:    [id number]    Weight:       259.0 lb Date of Birth:  [birth year]      BSA:          2.449 m Patient Age:    75 years      BP:           120/74 mmHg Patient Gender: M             HR:           80 bpm. Exam Location:  [facility] Procedure: 2D Echo, Cardiac Doppler, Color Doppler and Intracardiac            Opacification Agent Indications:     Cardiomyopathy-unspecified I42.9  History:         Patient has prior history of Echocardiogram examinations, most                  recent 11/28/2020. Cardiomyopathy and CHF; Risk                  Factors:Hypertension.  Sonographer:     Sherrie Sport Referring Phys:  [id number] Jeanella Flattery Gwenetta Devos Diagnosing Phys: Nelva Bush MD  Sonographer Comments: Suboptimal apical window. IMPRESSIONS  1. Left ventricular ejection fraction, by estimation, is 20 to 25%. The left ventricle has severely decreased function. The left ventricle demonstrates global hypokinesis. The left ventricular internal cavity size was moderately dilated. There is mild left ventricular hypertrophy. Left ventricular diastolic parameters are consistent with Grade II diastolic dysfunction (pseudonormalization). Elevated left atrial pressure.  2.  Right ventricular systolic function is mildly reduced. The right ventricular size is mildly enlarged. Tricuspid regurgitation signal is inadequate for assessing PA pressure.  3. Moderate pleural effusion in the left lateral region.  4. The mitral valve is abnormal. Mild to moderate mitral valve regurgitation. No evidence of mitral stenosis.  5. The aortic valve has an indeterminant number of cusps. There is mild calcification of the aortic valve. There is mild thickening of the aortic valve. Aortic  valve regurgitation is not visualized. Aortic valve sclerosis/calcification is present, without any evidence of aortic stenosis.  6. Pulmonic valve regurgitation not well assessed. FINDINGS  Left Ventricle: Left ventricular ejection fraction, by estimation, is 20 to 25%. The left ventricle has severely decreased function. The left ventricle demonstrates global hypokinesis. Definity contrast agent was given IV to delineate the left ventricular endocardial borders. The left ventricular internal cavity size was moderately dilated. There is mild left ventricular hypertrophy. Left ventricular diastolic parameters are consistent with Grade II diastolic dysfunction (pseudonormalization).  Elevated left atrial pressure. Right Ventricle: The right ventricular size is mildly enlarged. No increase in right ventricular wall thickness. Right ventricular systolic function is mildly reduced. Tricuspid regurgitation signal is inadequate for assessing PA pressure. Left Atrium: Left atrial size was normal in size. Right Atrium: Right atrial size was normal in size. Pericardium: The pericardium was not well visualized. Mitral Valve: The mitral valve is abnormal. There is mild thickening of the mitral valve leaflet(s). Mild to moderate mitral valve regurgitation. No evidence of mitral valve stenosis. Tricuspid Valve: The tricuspid valve is not well visualized. Tricuspid valve regurgitation is trivial. Aortic Valve: The aortic valve has an  indeterminant number of cusps. There is mild calcification of the aortic valve. There is mild thickening of the aortic valve. Aortic valve regurgitation is not visualized. Aortic valve sclerosis/calcification is present, without any evidence of aortic stenosis. Aortic valve mean gradient measures 2.0 mmHg. Aortic valve peak gradient measures 4.1 mmHg. Aortic valve area, by VTI measures 2.28 cm. Pulmonic Valve: The pulmonic valve was not well visualized. Pulmonic valve regurgitation not well assessed. Aorta: The aortic root is normal in size and structure. Pulmonary Artery: The pulmonary artery is not well seen. Venous: The inferior vena cava was not well visualized. IAS/Shunts: The interatrial septum was not well visualized. Additional Comments: There is a moderate pleural effusion in the left lateral region.  LEFT VENTRICLE PLAX 2D LVIDd:         6.40 cm      Diastology LVIDs:         5.90 cm      LV e' medial:    2.72 cm/s LV PW:         1.20 cm      LV E/e' medial:  37.9 LV IVS:        1.20 cm      LV e' lateral:   15.40 cm/s LVOT diam:     2.20 cm      LV E/e' lateral: 6.7 LV SV:         37 LV SV Index:   15 LVOT Area:     3.80 cm  LV Volumes (MOD) LV vol d, MOD A2C: 284.0 ml LV vol d, MOD A4C: 199.0 ml LV vol s, MOD A2C: 202.0 ml LV vol s, MOD A4C: 179.0 ml LV SV MOD A2C:     82.0 ml LV SV MOD A4C:     199.0 ml LV SV MOD BP:      48.6 ml RIGHT VENTRICLE RV Basal diam:  5.40 cm RV Mid diam:    4.30 cm LEFT ATRIUM             Index        RIGHT ATRIUM           Index LA diam:        3.80 cm 1.55 cm/m   RA Area:     20.30 cm LA Vol (A2C):  93.7 ml 38.26 ml/m  RA Volume:   68.90 ml  28.13 ml/m LA Vol (A4C):   35.8 ml 14.62 ml/m LA Biplane Vol: 62.6 ml 25.56 ml/m  AORTIC VALVE AV Area (Vmax):    2.23 cm AV Area (Vmean):   2.43 cm AV Area (VTI):     2.28 cm AV Vmax:           100.90 cm/s AV Vmean:          66.150 cm/s AV VTI:            0.163 m AV Peak Grad:      4.1 mmHg AV Mean Grad:      2.0 mmHg LVOT  Vmax:         59.10 cm/s LVOT Vmean:        42.200 cm/s LVOT VTI:          0.098 m LVOT/AV VTI ratio: 0.60  AORTA Ao Root diam: 3.40 cm MITRAL VALVE MV Area (PHT): 5.06 cm     SHUNTS MV Decel Time: 150 msec     Systemic VTI:  0.10 m MV E velocity: 103.00 cm/s  Systemic Diam: 2.20 cm MV A velocity: 71.30 cm/s MV E/A ratio:  1.44 Harrell Gave End MD Electronically signed by Nelva Bush MD Signature Date/Time: 11/19/2022/3:29:09 PM    Final    DG Chest Portable 1 View  Result Date: 11/18/2022 CLINICAL DATA:  sob EXAM: PORTABLE CHEST 1 VIEW COMPARISON:  11/27/2020 FINDINGS: Mild cardiac enlargement with slight central vascular prominence. No definite CHF pattern. Right lung remains clear. Increased left lower lobe opacity obscures left hemidiaphragm suspicious for atelectasis versus pneumonia. Difficult to exclude small left effusion. No pneumothorax. Trachea midline. No acute osseous finding. IMPRESSION: 1. Left lower lobe atelectasis versus pneumonia. 2. Suspect small left effusion. Electronically Signed   By: Jerilynn Mages.  Shick M.D.   On: 11/18/2022 16:35        Scheduled Meds:  aspirin EC  81 mg Oral Daily   atorvastatin  40 mg Oral Daily   furosemide  40 mg Intravenous BID   losartan  25 mg Oral Daily   metoprolol succinate  12.5 mg Oral Daily   sodium chloride flush  3 mL Intravenous Q12H   tamsulosin  0.4 mg Oral Daily   Continuous Infusions:     LOS: 2 days   Time spent= 35 mins    Prakriti Carignan Arsenio Loader, MD [facility]  If 7PM-7AM, please contact night-coverage  11/20/2022, 8:49 AM

## 2022-11-20 NOTE — Progress Notes (Signed)
       CROSS COVER NOTE  NAME: Scott Meyer MRN: [id number] DOB : Apr 21, 1951 ATTENDING PHYSICIAN: Damita Lack, MD    Date of Service   11/20/2022   HPI/Events of Note   Message received from RN reporting increased ectopy overnight after receiving lasix yesterday. K 3.5 and Mg 1.7 on AM labs. Will replete for goal K >4 and Mg >2.  Interventions   Assessment/Plan: 40 mEQ PO K 2 g IV Mg       To reach the provider On-Call:   7AM- 7PM see care teams to locate the attending and reach out to them via [email or web address] Password: [email or web address] 7PM-7AM contact night-coverage If you still have difficulty reaching the appropriate provider, please page the [facility] (Director on Call) for [facility] on amion for assistance  This document was prepared using Systems analyst and may include unintentional dictation errors.  Neomia Glass DNP, MBA, FNP-BC, PMHNP-BC Nurse Practitioner [facility] Pager [phone]

## 2022-11-21 DIAGNOSIS — E785 Hyperlipidemia, unspecified: Secondary | ICD-10-CM

## 2022-11-21 DIAGNOSIS — I5043 Acute on chronic combined systolic (congestive) and diastolic (congestive) heart failure: Secondary | ICD-10-CM

## 2022-11-21 DIAGNOSIS — I10 Essential (primary) hypertension: Secondary | ICD-10-CM

## 2022-11-21 LAB — CBC
HCT: 38.9 % — ABNORMAL LOW (ref 39.0–52.0)
Hemoglobin: 12.4 g/dL — ABNORMAL LOW (ref 13.0–17.0)
MCH: 30.6 pg (ref 26.0–34.0)
MCHC: 31.9 g/dL (ref 30.0–36.0)
MCV: 96 fL (ref 80.0–100.0)
Platelets: 148 10*3/uL — ABNORMAL LOW (ref 150–400)
RBC: 4.05 MIL/uL — ABNORMAL LOW (ref 4.22–5.81)
RDW: 12.8 % (ref 11.5–15.5)
WBC: 5.4 10*3/uL (ref 4.0–10.5)
nRBC: 0 % (ref 0.0–0.2)

## 2022-11-21 LAB — BASIC METABOLIC PANEL
Anion gap: 9 (ref 5–15)
BUN: 20 mg/dL (ref 8–23)
CO2: 23 mmol/L (ref 22–32)
Calcium: 8.7 mg/dL — ABNORMAL LOW (ref 8.9–10.3)
Chloride: 106 mmol/L (ref 98–111)
Creatinine, Ser: 1.14 mg/dL (ref 0.61–1.24)
GFR, Estimated: >60 mL/min (ref >60)
Glucose, Bld: 79 mg/dL (ref 70–99)
Potassium: 3.9 mmol/L (ref 3.5–5.1)
Sodium: 138 mmol/L (ref 135–145)

## 2022-11-21 LAB — HEMOGLOBIN A1C
Hgb A1c MFr Bld: 5.5 % (ref 4.8–5.6)
Mean Plasma Glucose: 111 mg/dL

## 2022-11-21 LAB — MAGNESIUM: Magnesium: 1.9 mg/dL (ref 1.7–2.4)

## 2022-11-21 MED ORDER — FUROSEMIDE 10 MG/ML IJ SOLN
60.0000 mg | Freq: Two times a day (BID) | INTRAMUSCULAR | Status: DC
  Administered 2022-11-21 (×2): 60 mg via INTRAVENOUS
  Filled 2022-11-21 (×2): qty 6

## 2022-11-21 MED ORDER — DAPAGLIFLOZIN PROPANEDIOL 10 MG PO TABS
10.0000 mg | ORAL_TABLET | Freq: Every day | ORAL | Status: DC
  Administered 2022-11-21 – 2022-11-22 (×2): 10 mg via ORAL
  Filled 2022-11-21 (×2): qty 1

## 2022-11-21 MED ORDER — SPIRONOLACTONE 12.5 MG HALF TABLET
12.5000 mg | ORAL_TABLET | Freq: Every day | ORAL | Status: DC
  Administered 2022-11-21: 12.5 mg via ORAL
  Filled 2022-11-21 (×2): qty 1

## 2022-11-21 MED ORDER — POTASSIUM CHLORIDE CRYS ER 20 MEQ PO TBCR
40.0000 meq | EXTENDED_RELEASE_TABLET | Freq: Once | ORAL | Status: AC
  Administered 2022-11-21: 40 meq via ORAL
  Filled 2022-11-21: qty 2

## 2022-11-21 NOTE — Progress Notes (Signed)
PROGRESS NOTE    Scott Meyer  [id number] DOB: 1950-12-09 DOA: 11/18/2022 PCP: Juline Patch, MD   Brief Narrative:  72 year old with congestive heart failure with reduced ejection fraction, HTN comes to the hospital with shortness of breath, increasing lower extremity swelling.  He was diagnosed with CHF exacerbation and admitted to the hospital.  Upon admission patient was started on diuretics, echocardiogram this admission shows EF of 20-25% with global hypokinesia and reduced RV function.  Cardiology team is following.  Ongoing aggressive diuresis, and patient is not agreeable for any ischemic evaluation at this time   Assessment & Plan:  Principal Problem:   Anasarca Active Problems:   Pulmonary edema   Amputation of right arm (HCC)   Lower urinary tract symptoms (LUTS)   Acidosis   LFT elevation   Troponin I above reference range   Acute exacerbation of CHF (congestive heart failure) (HCC)     Assessment and Plan: * Anasarca Acute congestive heart failure with reduced ejection fraction, 25%, class IV Pulmonary edema with small left-sided pleural effusion Updated echocardiogram this admission shows EF of 20-25% with global hypokinesia.  Continue aggressive IV diuresis, replete electrolytes as necessary.  Patient is refusing ischemic evaluation at this time.  GDMT per cardiology  Entresto CoPay is 0 for him per pharmacy.  Lower extremity TED hose/compression stockings to help with the swelling  Hypokalemia/hypomagnesemia - Repletion  Amputation of right arm ([place]) Secondary to trauma in 1970s fall precuations  Transaminitis, improved Likely viral hepatic congestion.  CK levels are normal  Metabolic acidosis From poor perfusion, bicarb improving  Lower urinary tract symptoms (LUTS) UA is unremarkable    DVT prophylaxis: Place TED hose Start: 11/20/22 1144Lovenox Code Status: Full Family Communication:    Status is: Inpatient On going diuresis.    Subjective: Still has b/l LE swelling but improved. Tells me his weight his down and feeling little better.   Examination: Constitutional: Not in acute distress Respiratory: Clear to auscultation bilaterally Cardiovascular: Normal sinus rhythm, no rubs Abdomen: Nontender nondistended good bowel sounds Musculoskeletal: 3+ b/l LE pitting edema Skin: No rashes seen Neurologic: CN 2-12 grossly intact.  And nonfocal Psychiatric: Normal judgment and insight. Alert and oriented x 3. Normal mood.     Objective: Vitals:   11/21/22 0000 11/21/22 0300 11/21/22 0506 11/21/22 0813  BP: 129/82 (!) 144/78  129/85  Pulse: 84 77  88  Resp: '20 18  16  '$ Temp: (!) 97.4 F (36.3 C) 97.9 F (36.6 C)  (!) 97.5 F (36.4 C)  TempSrc: Oral     SpO2: 100% 100%  100%  Weight:   106.3 kg   Height:        Intake/Output Summary (Last 24 hours) at 11/21/2022 0833 Last data filed at 11/21/2022 0504 Gross per 24 hour  Intake 1400 ml  Output 3650 ml  Net -2250 ml   Filed Weights   11/18/22 1614 11/20/22 0500 11/21/22 0506  Weight: 117.5 kg 110.2 kg 106.3 kg     Data Reviewed:   CBC: Recent Labs  Lab 11/18/22 1619 11/19/22 0639 11/20/22 0145 11/21/22 0448  WBC 6.5 5.8 5.4 5.4  NEUTROABS 4.3  --   --   --   HGB 14.5 13.6 12.3* 12.4*  HCT 47.5 44.0 38.1* 38.9*  MCV 100.4* 99.3 94.8 96.0  PLT 162 141* 132* [id number]*   Basic Metabolic Panel: Recent Labs  Lab 11/18/22 1735 11/19/22 0639 11/20/22 0145 11/21/22 0448  NA 137 137 139 138  K  4.2 3.8 3.5 3.9  CL 108 108 106 106  CO2 18* 21* 25 23  GLUCOSE 91 87 121* 79  BUN '23 22 23 20  '$ CREATININE 1.11 1.13 1.16 1.14  CALCIUM 9.0 8.9 8.9 8.7*  MG  --  1.6* 1.7 1.9   GFR: Estimated Creatinine Clearance: 77.2 mL/min (by C-G formula based on SCr of 1.14 mg/dL). Liver Function Tests: Recent Labs  Lab 11/18/22 1735 11/19/22 0639  AST 48* 39  ALT 48* 42  ALKPHOS 92 80  BILITOT 2.3* 2.4*  PROT 6.7 6.3*  ALBUMIN 3.3* 3.1*   No results  for input(s): "LIPASE", "AMYLASE" in the last 168 hours. No results for input(s): "AMMONIA" in the last 168 hours. Coagulation Profile: Recent Labs  Lab 11/19/22 0639  INR 1.2   Cardiac Enzymes: Recent Labs  Lab 11/18/22 1743  CKTOTAL 296   BNP (last 3 results) No results for input(s): "PROBNP" in the last 8760 hours. HbA1C: Recent Labs    11/19/22 0639  HGBA1C 5.5   CBG: No results for input(s): "GLUCAP" in the last 168 hours. Lipid Profile: Recent Labs    11/20/22 0145  CHOL 115  HDL 27*  LDLCALC 79  TRIG 45  CHOLHDL 4.3   Thyroid Function Tests: No results for input(s): "TSH", "T4TOTAL", "FREET4", "T3FREE", "THYROIDAB" in the last 72 hours. Anemia Panel: No results for input(s): "VITAMINB12", "FOLATE", "FERRITIN", "TIBC", "IRON", "RETICCTPCT" in the last 72 hours. Sepsis Labs: Recent Labs  Lab 11/18/22 1938  PROCALCITON <0.10    Recent Results (from the past 240 hour(s))  Resp panel by RT-PCR (RSV, Flu A&B, Covid) Anterior Nasal Swab     Status: None   Collection Time: 11/18/22  4:19 PM   Specimen: Anterior Nasal Swab  Result Value Ref Range Status   SARS Coronavirus 2 by RT PCR NEGATIVE NEGATIVE Final    Comment: (NOTE) SARS-CoV-2 target nucleic acids are NOT DETECTED.  The SARS-CoV-2 RNA is generally detectable in upper respiratory specimens during the acute phase of infection. The lowest concentration of SARS-CoV-2 viral copies this assay can detect is 138 copies/mL. A negative result does not preclude SARS-Cov-2 infection and should not be used as the sole basis for treatment or other patient management decisions. A negative result may occur with  improper specimen collection/handling, submission of specimen other than nasopharyngeal swab, presence of viral mutation(s) within the areas targeted by this assay, and inadequate number of viral copies(<138 copies/mL). A negative result must be combined with clinical observations, patient history, and  epidemiological information. The expected result is Negative.  Fact Sheet for Patients:  EntrepreneurPulse.com.au  Fact Sheet for Healthcare Providers:  IncredibleEmployment.be  This test is no t yet approved or cleared by the [place] FDA and  has been authorized for detection and/or diagnosis of SARS-CoV-2 by FDA under an Emergency Use Authorization (EUA). This EUA will remain  in effect (meaning this test can be used) for the duration of the COVID-19 declaration under Section 564(b)(1) of the Act, 21 U.S.C.section 360bbb-3(b)(1), unless the authorization is terminated  or revoked sooner.       Influenza A by PCR NEGATIVE NEGATIVE Final   Influenza B by PCR NEGATIVE NEGATIVE Final    Comment: (NOTE) The Xpert Xpress SARS-CoV-2/FLU/RSV plus assay is intended as an aid in the diagnosis of influenza from Nasopharyngeal swab specimens and should not be used as a sole basis for treatment. Nasal washings and aspirates are unacceptable for Xpert Xpress SARS-CoV-2/FLU/RSV testing.  Fact Sheet for  Patients: EntrepreneurPulse.com.au  Fact Sheet for Healthcare Providers: IncredibleEmployment.be  This test is not yet approved or cleared by the [place] FDA and has been authorized for detection and/or diagnosis of SARS-CoV-2 by FDA under an Emergency Use Authorization (EUA). This EUA will remain in effect (meaning this test can be used) for the duration of the COVID-19 declaration under Section 564(b)(1) of the Act, 21 U.S.C. section 360bbb-3(b)(1), unless the authorization is terminated or revoked.     Resp Syncytial Virus by PCR NEGATIVE NEGATIVE Final    Comment: (NOTE) Fact Sheet for Patients: EntrepreneurPulse.com.au  Fact Sheet for Healthcare Providers: IncredibleEmployment.be  This test is not yet approved or cleared by the [place] FDA and has been  authorized for detection and/or diagnosis of SARS-CoV-2 by FDA under an Emergency Use Authorization (EUA). This EUA will remain in effect (meaning this test can be used) for the duration of the COVID-19 declaration under Section 564(b)(1) of the Act, 21 U.S.C. section 360bbb-3(b)(1), unless the authorization is terminated or revoked.  Performed at [facility], [address], [place], [place] [address]          Radiology Studies: ECHOCARDIOGRAM COMPLETE  Result Date: 11/19/2022    ECHOCARDIOGRAM REPORT   Patient Name:   Scott Meyer Date of Exam: 11/19/2022 Medical Rec #:  [id number]     Height:       75.0 in Accession #:    [id number]    Weight:       259.0 lb Date of Birth:  1951/06/07      BSA:          2.449 m Patient Age:    35 years      BP:           120/74 mmHg Patient Gender: M             HR:           80 bpm. Exam Location:  [facility] Procedure: 2D Echo, Cardiac Doppler, Color Doppler and Intracardiac            Opacification Agent Indications:     Cardiomyopathy-unspecified I42.9  History:         Patient has prior history of Echocardiogram examinations, most                  recent 11/28/2020. Cardiomyopathy and CHF; Risk                  Factors:Hypertension.  Sonographer:     Sherrie Sport Referring Phys:  [id number] Jeanella Flattery Birgitta Uhlir Diagnosing Phys: Nelva Bush MD  Sonographer Comments: Suboptimal apical window. IMPRESSIONS  1. Left ventricular ejection fraction, by estimation, is 20 to 25%. The left ventricle has severely decreased function. The left ventricle demonstrates global hypokinesis. The left ventricular internal cavity size was moderately dilated. There is mild left ventricular hypertrophy. Left ventricular diastolic parameters are consistent with Grade II diastolic dysfunction (pseudonormalization). Elevated left atrial pressure.  2. Right ventricular systolic function is mildly reduced. The right ventricular size is mildly enlarged. Tricuspid regurgitation signal is  inadequate for assessing PA pressure.  3. Moderate pleural effusion in the left lateral region.  4. The mitral valve is abnormal. Mild to moderate mitral valve regurgitation. No evidence of mitral stenosis.  5. The aortic valve has an indeterminant number of cusps. There is mild calcification of the aortic valve. There is mild thickening of the aortic valve. Aortic valve regurgitation is not visualized. Aortic valve sclerosis/calcification is present,  without any evidence of aortic stenosis.  6. Pulmonic valve regurgitation not well assessed. FINDINGS  Left Ventricle: Left ventricular ejection fraction, by estimation, is 20 to 25%. The left ventricle has severely decreased function. The left ventricle demonstrates global hypokinesis. Definity contrast agent was given IV to delineate the left ventricular endocardial borders. The left ventricular internal cavity size was moderately dilated. There is mild left ventricular hypertrophy. Left ventricular diastolic parameters are consistent with Grade II diastolic dysfunction (pseudonormalization).  Elevated left atrial pressure. Right Ventricle: The right ventricular size is mildly enlarged. No increase in right ventricular wall thickness. Right ventricular systolic function is mildly reduced. Tricuspid regurgitation signal is inadequate for assessing PA pressure. Left Atrium: Left atrial size was normal in size. Right Atrium: Right atrial size was normal in size. Pericardium: The pericardium was not well visualized. Mitral Valve: The mitral valve is abnormal. There is mild thickening of the mitral valve leaflet(s). Mild to moderate mitral valve regurgitation. No evidence of mitral valve stenosis. Tricuspid Valve: The tricuspid valve is not well visualized. Tricuspid valve regurgitation is trivial. Aortic Valve: The aortic valve has an indeterminant number of cusps. There is mild calcification of the aortic valve. There is mild thickening of the aortic valve. Aortic valve  regurgitation is not visualized. Aortic valve sclerosis/calcification is present, without any evidence of aortic stenosis. Aortic valve mean gradient measures 2.0 mmHg. Aortic valve peak gradient measures 4.1 mmHg. Aortic valve area, by VTI measures 2.28 cm. Pulmonic Valve: The pulmonic valve was not well visualized. Pulmonic valve regurgitation not well assessed. Aorta: The aortic root is normal in size and structure. Pulmonary Artery: The pulmonary artery is not well seen. Venous: The inferior vena cava was not well visualized. IAS/Shunts: The interatrial septum was not well visualized. Additional Comments: There is a moderate pleural effusion in the left lateral region.  LEFT VENTRICLE PLAX 2D LVIDd:         6.40 cm      Diastology LVIDs:         5.90 cm      LV e' medial:    2.72 cm/s LV PW:         1.20 cm      LV E/e' medial:  37.9 LV IVS:        1.20 cm      LV e' lateral:   15.40 cm/s LVOT diam:     2.20 cm      LV E/e' lateral: 6.7 LV SV:         37 LV SV Index:   15 LVOT Area:     3.80 cm  LV Volumes (MOD) LV vol d, MOD A2C: 284.0 ml LV vol d, MOD A4C: 199.0 ml LV vol s, MOD A2C: 202.0 ml LV vol s, MOD A4C: 179.0 ml LV SV MOD A2C:     82.0 ml LV SV MOD A4C:     199.0 ml LV SV MOD BP:      48.6 ml RIGHT VENTRICLE RV Basal diam:  5.40 cm RV Mid diam:    4.30 cm LEFT ATRIUM             Index        RIGHT ATRIUM           Index LA diam:        3.80 cm 1.55 cm/m   RA Area:     20.30 cm LA Vol (A2C):   93.7 ml 38.26 ml/m  RA Volume:  68.90 ml  28.13 ml/m LA Vol (A4C):   35.8 ml 14.62 ml/m LA Biplane Vol: 62.6 ml 25.56 ml/m  AORTIC VALVE AV Area (Vmax):    2.23 cm AV Area (Vmean):   2.43 cm AV Area (VTI):     2.28 cm AV Vmax:           100.90 cm/s AV Vmean:          66.150 cm/s AV VTI:            0.163 m AV Peak Grad:      4.1 mmHg AV Mean Grad:      2.0 mmHg LVOT Vmax:         59.10 cm/s LVOT Vmean:        42.200 cm/s LVOT VTI:          0.098 m LVOT/AV VTI ratio: 0.60  AORTA Ao Root diam: 3.40 cm  MITRAL VALVE MV Area (PHT): 5.06 cm     SHUNTS MV Decel Time: 150 msec     Systemic VTI:  0.10 m MV E velocity: 103.00 cm/s  Systemic Diam: 2.20 cm MV A velocity: 71.30 cm/s MV E/A ratio:  1.44 Christopher End MD Electronically signed by Nelva Bush MD Signature Date/Time: 11/19/2022/3:29:09 PM    Final         Scheduled Meds:  aspirin EC  81 mg Oral Daily   atorvastatin  40 mg Oral Daily   furosemide  40 mg Intravenous BID   metoprolol succinate  25 mg Oral Daily   sacubitril-valsartan  1 tablet Oral BID   sodium chloride flush  3 mL Intravenous Q12H   tamsulosin  0.4 mg Oral Daily   Continuous Infusions:     LOS: 3 days   Time spent= 35 mins    Alegandro Macnaughton Arsenio Loader, MD [facility]  If 7PM-7AM, please contact night-coverage  11/21/2022, 8:33 AM

## 2022-11-21 NOTE — Progress Notes (Signed)
Mobility Specialist - Progress Note   11/21/22 1419  Mobility  Activity Ambulated independently in hallway;Stood at bedside;Dangled on edge of bed  Level of Assistance Independent  Assistive Device None  Distance Ambulated (ft) 340 ft  Activity Response Tolerated well  Mobility Referral Yes  $Mobility charge 1 Mobility   Pt EOB on RA upon arrival. Pt STS and ambulates 2 laps around NS Indep. Pt returns to EOB with needs in reach.   Gretchen Short  Mobility Specialist  11/21/22 2:20 PM

## 2022-11-21 NOTE — TOC Initial Note (Signed)
Transition of Care [facility]) - Initial/Assessment Note    Patient Details  Name: Scott Meyer MRN: [id number] Date of Birth: 08-Nov-1950  Transition of Care [facility]) CM/SW Contact:    Laurena Slimmer, RN Phone Number: 11/21/2022, 4:08 PM  Clinical Narrative:                  Transition of Care [facility]) Screening Note   Patient Details  Name: Scott Meyer Date of Birth: [birth year]   Transition of Care [facility]) CM/SW Contact:    Laurena Slimmer, RN Phone Number: 11/21/2022, 4:08 PM    Transition of Care Department [facility]) has reviewed patient and no TOC needs have been identified at this time. We will continue to monitor patient advancement through interdisciplinary progression rounds. If new patient transition needs arise, please place a TOC consult.          Patient Goals and CMS Choice            Expected Discharge Plan and Services                                              Prior Living Arrangements/Services                       Activities of Daily Living Home Assistive Devices/Equipment: Eyeglasses ADL Screening (condition at time of admission) Patient's cognitive ability adequate to safely complete daily activities?: Yes Is the patient deaf or have difficulty hearing?: No Does the patient have difficulty seeing, even when wearing glasses/contacts?: No Does the patient have difficulty concentrating, remembering, or making decisions?: No Patient able to express need for assistance with ADLs?: Yes Does the patient have difficulty dressing or bathing?: No Independently performs ADLs?: Yes (appropriate for developmental age) Does the patient have difficulty walking or climbing stairs?: No Weakness of Legs: None Weakness of Arms/Hands: None  Permission Sought/Granted                  Emotional Assessment              Admission diagnosis:  Shortness of breath [R06.02] Anasarca [R60.1] Acute exacerbation of CHF (congestive heart failure)  ([place]) [I50.9] Acute on chronic congestive heart failure, unspecified heart failure type ([place]) [I50.9] Patient Active Problem List   Diagnosis Date Noted   Acute exacerbation of CHF (congestive heart failure) ([place]) 11/19/2022   Anasarca 11/18/2022   Pulmonary edema 11/18/2022   Lower urinary tract symptoms (LUTS) 11/18/2022   Acidosis 11/18/2022   LFT elevation 11/18/2022   Troponin I above reference range 11/18/2022   Paronychia of left index finger 06/13/2022   Dilated cardiomyopathy ([place]) 11/30/2020   Atrial tachycardia 11/30/2020   Frequent PVCs 11/30/2020   Troponin level elevated 11/29/2020   Benign essential HTN 11/29/2020   HLD (hyperlipidemia) 11/29/2020   Hypomagnesemia 11/29/2020   Amputation of right arm ([place]) 11/29/2020   Acute combined systolic and diastolic congestive heart failure ([place]) 11/29/2020   NSVT (nonsustained ventricular tachycardia) ([place]) 11/29/2020   Chest pain 11/27/2020   PCP:  Juline Patch, MD Pharmacy:   [facility] [place], [place] [facility] [address] AT [address] [place] [facility] [place] [address] Phone: [phone] Fax: [phone]     Social Determinants of Health (SDOH) Social History: SDOH Screenings   Food Insecurity:  No Food Insecurity (05/24/2022)  Housing: Low Risk  (05/24/2022)  Transportation Needs: No Transportation Needs (05/24/2022)  Utilities: Not At Risk (05/24/2022)  Alcohol Screen: Low Risk  (05/24/2022)  Depression (PHQ2-9): Low Risk  (11/18/2022)  Financial Resource Strain: Low Risk  (05/24/2022)  Physical Activity: Inactive (05/24/2022)  Social Connections: Socially Isolated (05/24/2022)  Stress: No Stress Concern Present (05/24/2022)  Tobacco Use: Medium Risk (11/18/2022)   SDOH Interventions:     Readmission Risk Interventions     No data to display

## 2022-11-21 NOTE — Consult Note (Signed)
Advanced Heart Failure Team Consult Note   Primary Physician: Juline Patch, MD PCP-Cardiologist:  Nelva Bush, MD  Reason for Consultation: CHF  HPI:    Scott Meyer is seen today for evaluation of CHF at the request of Dr. Garen Lah.   Scott Meyer is a 72 y/o male with a history of HTN, previous tobacco use and chronic heart failure.    Echo report from 11/28/20 reviewed and showed an EF of 25-30% along with moderate LVH.    He was admitted with with worsening exertional dyspnea and peripheral edema.  Echo this admission showed EF 20-25%, moderate LV dilation, mild RV dysfunction, mild-moderate Scott.  It is not clear that he was taking any medications at home.  He has diuresed well so far with IV Lasix, weight down 8 lbs since yesterday.  Creatinine stable 1.14, SBP 120s.  He still has significant LE edema.   Patient has not had ischemic workup, has refused cath.   Review of Systems: All systems reviewed and negative except as per HPI.   Home Medications Prior to Admission medications   Medication Sig Start Date End Date Taking? Authorizing Provider  atorvastatin (LIPITOR) 40 MG tablet Take 1 tablet (40 mg total) by mouth daily. TAKE 1 TABLET(40 MG) BY MOUTH DAILY Strength: 40 mg Patient not taking: Reported on 11/18/2022 06/13/22   Juline Patch, MD  furosemide (LASIX) 40 MG tablet TAKE 1 TABLET(40 MG) BY MOUTH DAILY Patient not taking: Reported on 11/18/2022 06/21/21   Alisa Graff, FNP  losartan (COZAAR) 25 MG tablet TAKE 1 TABLET(25 MG) BY MOUTH DAILY Patient not taking: Reported on 11/18/2022 12/20/21   Alisa Graff, FNP  metoprolol succinate (TOPROL-XL) 25 MG 24 hr tablet TAKE 1 TABLET(25 MG) BY MOUTH DAILY Patient not taking: Reported on 06/13/2022 06/21/21   Alisa Graff, FNP  nitroGLYCERIN (NITROSTAT) 0.4 MG SL tablet Place 1 tablet (0.4 mg total) under the tongue every 5 (five) minutes as needed for chest pain. Patient not taking: Reported on 01/23/2022 11/30/20    Debbe Odea, MD  potassium chloride (KLOR-CON) 10 MEQ tablet Take 1 tablet (10 mEq total) by mouth daily. Patient not taking: Reported on 07/02/2022 07/01/22   Alisa Graff, FNP  lisinopril (PRINIVIL,ZESTRIL) 10 MG tablet Take 1 tablet (10 mg total) by mouth daily. 06/21/18 04/08/19  Norval Gable, MD    Past Medical History: Past Medical History:  Diagnosis Date   Cardiomyopathy [facility])    a. 11/2020 Echo: EF 25-30%, mod LVH, Gr1 DD. Mild AoV Ca2+ w/ mild to mod AoV sclerosis.   CHF (congestive heart failure) (HCC)    Hypertension     Past Surgical History: Past Surgical History:  Procedure Laterality Date   ARM AMPUTATION AT ELBOW Right    KNEE SURGERY      Family History: Family History  Problem Relation Age of Onset   Diabetes Mother    Cirrhosis Father    Alcohol abuse Father    Other Brother        gsw   Cancer Brother        'head full of tumors'   Other Brother        'killed w/ 2x4 to the head'   Other Sister        mva    Social History: Social History   Socioeconomic History   Marital status: Single    Spouse name: Not on file   Number of children: Not on file  Years of education: Not on file   Highest education level: Not on file  Occupational History   Occupation: Retired  Tobacco Use   Smoking status: Former    Types: Cigarettes    Quit date: 10/09/2017    Years since quitting: 5.1   Smokeless tobacco: Never  Vaping Use   Vaping Use: Never used  Substance and Sexual Activity   Alcohol use: No    Comment: quit 8 months ago   Drug use: No   Sexual activity: Not on file  Other Topics Concern   Not on file  Social History Narrative   Lives in [place] w/ his sister.  Does not routinely exercise.   Social Determinants of Health   Financial Resource Strain: Low Risk  (05/24/2022)   Overall Financial Resource Strain (CARDIA)    Difficulty of Paying Living Expenses: Not hard at all  Food Insecurity: No Food Insecurity (05/24/2022)   Hunger  Vital Sign    Worried About Running Out of Food in the Last Year: Never true    Ran Out of Food in the Last Year: Never true  Transportation Needs: No Transportation Needs (05/24/2022)   PRAPARE - Hydrologist (Medical): No    Lack of Transportation (Non-Medical): No  Physical Activity: Inactive (05/24/2022)   Exercise Vital Sign    Days of Exercise per Week: 0 days    Minutes of Exercise per Session: 0 min  Stress: No Stress Concern Present (05/24/2022)   [place]    Feeling of Stress : Only a little  Social Connections: Socially Isolated (05/24/2022)   Social Connection and Isolation Panel [NHANES]    Frequency of Communication with Friends and Family: Never    Frequency of Social Gatherings with Friends and Family: Never    Attends Religious Services: Never    Marine scientist or Organizations: No    Attends Music therapist: Never    Marital Status: Never married    Allergies:  No Known Allergies  Objective:    Vital Signs:   Temp:  [97.4 F (36.3 C)-97.9 F (36.6 C)] 97.5 F (36.4 C) (03/07 0813) Pulse Rate:  [77-88] 88 (03/07 0813) Resp:  [16-20] 16 (03/07 0813) BP: (102-144)/(68-88) 129/85 (03/07 0813) SpO2:  [97 %-100 %] 100 % (03/07 0813) Weight:  [106.3 kg] 106.3 kg (03/07 0506) Last BM Date : 11/18/22  Weight change: Filed Weights   11/18/22 1614 11/20/22 0500 11/21/22 0506  Weight: 117.5 kg 110.2 kg 106.3 kg    Intake/Output:   Intake/Output Summary (Last 24 hours) at 11/21/2022 0920 Last data filed at 11/21/2022 0504 Gross per 24 hour  Intake 1160 ml  Output 3650 ml  Net -2490 ml      Physical Exam    General:  Well appearing. No resp difficulty HEENT: normal Neck: supple. JVP 10 cm. Carotids 2+ bilat; no bruits. No lymphadenopathy or thyromegaly appreciated. Cor: PMI nondisplaced. Regular rate & rhythm. No rubs, gallops or murmurs. Lungs:  clear Abdomen: soft, nontender, nondistended. No hepatosplenomegaly. No bruits or masses. Good bowel sounds. Extremities: no cyanosis, clubbing, rash.  1+ edema to knees.  Neuro: alert & orientedx3, cranial nerves grossly intact. moves all 4 extremities w/o difficulty. Affect pleasant   Telemetry   NSR (personally reviewed)  EKG    NSR, LBBB (personally reviewed)  Labs   Basic Metabolic Panel: Recent Labs  Lab 11/18/22 1735 11/19/22 [id number] 11/20/22 0145  11/21/22 0448  NA 137 137 139 138  K 4.2 3.8 3.5 3.9  CL 108 108 106 106  CO2 18* 21* 25 23  GLUCOSE 91 87 121* 79  BUN '23 22 23 20  '$ CREATININE 1.11 1.13 1.16 1.14  CALCIUM 9.0 8.9 8.9 8.7*  MG  --  1.6* 1.7 1.9    Liver Function Tests: Recent Labs  Lab 11/18/22 1735 11/19/22 0639  AST 48* 39  ALT 48* 42  ALKPHOS 92 80  BILITOT 2.3* 2.4*  PROT 6.7 6.3*  ALBUMIN 3.3* 3.1*   No results for input(s): "LIPASE", "AMYLASE" in the last 168 hours. No results for input(s): "AMMONIA" in the last 168 hours.  CBC: Recent Labs  Lab 11/18/22 1619 11/19/22 0639 11/20/22 0145 11/21/22 0448  WBC 6.5 5.8 5.4 5.4  NEUTROABS 4.3  --   --   --   HGB 14.5 13.6 12.3* 12.4*  HCT 47.5 44.0 38.1* 38.9*  MCV 100.4* 99.3 94.8 96.0  PLT 162 141* 132* 148*    Cardiac Enzymes: Recent Labs  Lab 11/18/22 1743  CKTOTAL 296    BNP: BNP (last 3 results) Recent Labs    11/18/22 1619  BNP 1,074.7*    ProBNP (last 3 results) No results for input(s): "PROBNP" in the last 8760 hours.   CBG: No results for input(s): "GLUCAP" in the last 168 hours.  Coagulation Studies: Recent Labs    11/19/22 0639  LABPROT 15.1  INR 1.2     Imaging   No results found.   Medications:     Current Medications:  aspirin EC  81 mg Oral Daily   atorvastatin  40 mg Oral Daily   dapagliflozin propanediol  10 mg Oral Daily   furosemide  60 mg Intravenous BID   metoprolol succinate  25 mg Oral Daily   potassium chloride  40 mEq  Oral Once   sacubitril-valsartan  1 tablet Oral BID   sodium chloride flush  3 mL Intravenous Q12H   spironolactone  12.5 mg Oral Daily   tamsulosin  0.4 mg Oral Daily    Infusions:     Assessment/Plan   1. Acute on chronic systolic CHF: Echo from [id number] with EF 25-30%, echo this admission with EF 20-25%, mild RV dysfunction.  Cause of cardiomyopathy is uncertain.  He has refused diagnostic catheterization in the past and he continues to refuse today.  He is still volume overloaded on exam.  BP stable.  It does not sound like he has been compliant with meds at home.  - Will need close followup after this admission, would likely benefit from paramedicine.  - Continue Lasix at 60 mg IV bid today and replace K.  - Continue Entresto 24/26 bid  - Continue Toprol XL 25 mg dialy.  - Will start dapagliflozin 10 mg daily.  - Will start spironolactone 12.5 daily.  - Will try to get eventual cardiac MRI.  2. Hyperlipidemia: He is on atorvastatin.  3. HTN: BP controlled.   Length of Stay: 3  Loralie Champagne, MD  11/21/2022, 9:20 AM  Advanced Heart Failure Team Pager [phone] (M-F; 7a - 5p)  Please contact [facility] for night-coverage after hours (4p -7a ) and weekends on [email or web address]

## 2022-11-21 NOTE — Care Management Important Message (Signed)
Important Message  Patient Details  Name: Scott Meyer MRN: [id number] Date of Birth: 01-07-1951   Medicare Important Message Given:  Yes     Dannette Barbara 11/21/2022, 3:35 PM

## 2022-11-22 DIAGNOSIS — I4719 Other supraventricular tachycardia: Secondary | ICD-10-CM

## 2022-11-22 LAB — CBC
HCT: 41.3 % (ref 39.0–52.0)
Hemoglobin: 13.4 g/dL (ref 13.0–17.0)
MCH: 31 pg (ref 26.0–34.0)
MCHC: 32.4 g/dL (ref 30.0–36.0)
MCV: 95.6 fL (ref 80.0–100.0)
Platelets: 154 10*3/uL (ref 150–400)
RBC: 4.32 MIL/uL (ref 4.22–5.81)
RDW: 12.5 % (ref 11.5–15.5)
WBC: 5.3 10*3/uL (ref 4.0–10.5)
nRBC: 0 % (ref 0.0–0.2)

## 2022-11-22 LAB — BASIC METABOLIC PANEL
Anion gap: 7 (ref 5–15)
BUN: 19 mg/dL (ref 8–23)
CO2: 25 mmol/L (ref 22–32)
Calcium: 8.6 mg/dL — ABNORMAL LOW (ref 8.9–10.3)
Chloride: 102 mmol/L (ref 98–111)
Creatinine, Ser: 1.14 mg/dL (ref 0.61–1.24)
GFR, Estimated: >60 mL/min (ref >60)
Glucose, Bld: 96 mg/dL (ref 70–99)
Potassium: 3.5 mmol/L (ref 3.5–5.1)
Sodium: 134 mmol/L — ABNORMAL LOW (ref 135–145)

## 2022-11-22 LAB — MAGNESIUM: Magnesium: 1.7 mg/dL (ref 1.7–2.4)

## 2022-11-22 MED ORDER — POTASSIUM CHLORIDE CRYS ER 20 MEQ PO TBCR: 20.0000 meq | EXTENDED_RELEASE_TABLET | Freq: Every day | ORAL | Status: DC

## 2022-11-22 MED ORDER — POTASSIUM CHLORIDE ER 20 MEQ PO TBCR: 10.0000 meq | EXTENDED_RELEASE_TABLET | Freq: Every day | ORAL | 0 refills | Status: DC

## 2022-11-22 MED ORDER — ASPIRIN 81 MG PO TBEC: 81.0000 mg | DELAYED_RELEASE_TABLET | Freq: Every day | ORAL | 0 refills | Status: DC

## 2022-11-22 MED ORDER — ATORVASTATIN CALCIUM 40 MG PO TABS: 40.0000 mg | ORAL_TABLET | Freq: Every day | ORAL | 0 refills | Status: DC

## 2022-11-22 MED ORDER — SPIRONOLACTONE 25 MG PO TABS
25.0000 mg | ORAL_TABLET | Freq: Every day | ORAL | Status: DC
  Administered 2022-11-22: 25 mg via ORAL
  Filled 2022-11-22: qty 1

## 2022-11-22 MED ORDER — SACUBITRIL-VALSARTAN 24-26 MG PO TABS: 1.0000 | ORAL_TABLET | Freq: Two times a day (BID) | ORAL | 0 refills | Status: DC

## 2022-11-22 MED ORDER — TAMSULOSIN HCL 0.4 MG PO CAPS: 0.4000 mg | ORAL_CAPSULE | Freq: Every day | ORAL | 0 refills | Status: DC

## 2022-11-22 MED ORDER — DAPAGLIFLOZIN PROPANEDIOL 10 MG PO TABS: 10.0000 mg | ORAL_TABLET | Freq: Every day | ORAL | 0 refills | Status: DC

## 2022-11-22 MED ORDER — TORSEMIDE 20 MG PO TABS: 20.0000 mg | ORAL_TABLET | Freq: Every day | ORAL | Status: DC

## 2022-11-22 MED ORDER — FUROSEMIDE 10 MG/ML IJ SOLN
60.0000 mg | Freq: Once | INTRAMUSCULAR | Status: AC
  Administered 2022-11-22: 60 mg via INTRAVENOUS
  Filled 2022-11-22: qty 6

## 2022-11-22 MED ORDER — TORSEMIDE 20 MG PO TABS: 20.0000 mg | ORAL_TABLET | Freq: Every day | ORAL | 0 refills | Status: DC

## 2022-11-22 MED ORDER — MAGNESIUM SULFATE 2 GM/50ML IV SOLN
2.0000 g | Freq: Once | INTRAVENOUS | Status: AC
  Administered 2022-11-22: 2 g via INTRAVENOUS
  Filled 2022-11-22: qty 50

## 2022-11-22 MED ORDER — SPIRONOLACTONE 25 MG PO TABS: 25.0000 mg | ORAL_TABLET | Freq: Every day | ORAL | 0 refills | Status: DC

## 2022-11-22 MED ORDER — METOPROLOL SUCCINATE ER 25 MG PO TB24: 25.0000 mg | ORAL_TABLET | Freq: Every day | ORAL | 0 refills | Status: DC

## 2022-11-22 MED ORDER — POTASSIUM CHLORIDE CRYS ER 20 MEQ PO TBCR
40.0000 meq | EXTENDED_RELEASE_TABLET | Freq: Once | ORAL | Status: AC
  Administered 2022-11-22: 40 meq via ORAL
  Filled 2022-11-22: qty 2

## 2022-11-22 NOTE — Progress Notes (Signed)
Patient ID: Scott Meyer, male   DOB: 01/07/1951, 72 y.o.   MRN: [id number]     Advanced Heart Failure Rounding Note  PCP-Cardiologist: Nelva Bush, MD   Subjective:    Good diuresis yesterday, weight down 8 lbs.  He wants to go home, denies dyspnea.   Patient had a run of 6 beats NSVT overnight.    Objective:   Weight Range: 106.3 kg Body mass index is 29.29 kg/m.   Vital Signs:   Temp:  [97.3 F (36.3 C)-97.9 F (36.6 C)] 97.9 F (36.6 C) (03/08 0654) Pulse Rate:  [77-100] 83 (03/08 0654) Resp:  [16-20] 20 (03/08 0654) BP: (102-129)/(63-87) 111/70 (03/08 0654) SpO2:  [92 %-100 %] 99 % (03/08 0654) Last BM Date : 11/18/22  Weight change: Filed Weights   11/18/22 1614 11/20/22 0500 11/21/22 0506  Weight: 117.5 kg 110.2 kg 106.3 kg    Intake/Output:   Intake/Output Summary (Last 24 hours) at 11/22/2022 0803 Last data filed at 11/22/2022 0241 Gross per 24 hour  Intake 608 ml  Output 5000 ml  Net -4392 ml      Physical Exam    General:  Well appearing. No resp difficulty HEENT: Normal Neck: Supple. JVP 7-8 cm. Carotids 2+ bilat; no bruits. No lymphadenopathy or thyromegaly appreciated. Cor: PMI nondisplaced. Regular rate & rhythm. No rubs, gallops or murmurs. Lungs: Clear Abdomen: Soft, nontender, nondistended. No hepatosplenomegaly. No bruits or masses. Good bowel sounds. Extremities: No cyanosis, clubbing, rash. 1+ edema 3/4 to knees.  Neuro: Alert & orientedx3, cranial nerves grossly intact. moves all 4 extremities w/o difficulty. Affect pleasant   Telemetry   NSR, run of NSVT 6 beats last night. Personally reviewed  Labs    CBC Recent Labs    11/21/22 0448 11/22/22 0519  WBC 5.4 5.3  HGB 12.4* 13.4  HCT 38.9* 41.3  MCV 96.0 95.6  PLT 148* [id number]   Basic Metabolic Panel Recent Labs    11/21/22 0448 11/22/22 0519  NA 138 134*  K 3.9 3.5  CL 106 102  CO2 23 25  GLUCOSE 79 96  BUN 20 19  CREATININE 1.14 1.14  CALCIUM 8.7* 8.6*  MG 1.9  1.7   Liver Function Tests No results for input(s): "AST", "ALT", "ALKPHOS", "BILITOT", "PROT", "ALBUMIN" in the last 72 hours. No results for input(s): "LIPASE", "AMYLASE" in the last 72 hours. Cardiac Enzymes No results for input(s): "CKTOTAL", "CKMB", "CKMBINDEX", "TROPONINI" in the last 72 hours.  BNP: BNP (last 3 results) Recent Labs    11/18/22 1619  BNP 1,074.7*    ProBNP (last 3 results) No results for input(s): "PROBNP" in the last 8760 hours.   D-Dimer No results for input(s): "DDIMER" in the last 72 hours. Hemoglobin A1C No results for input(s): "HGBA1C" in the last 72 hours. Fasting Lipid Panel Recent Labs    11/20/22 0145  CHOL 115  HDL 27*  LDLCALC 79  TRIG 45  CHOLHDL 4.3   Thyroid Function Tests No results for input(s): "TSH", "T4TOTAL", "T3FREE", "THYROIDAB" in the last 72 hours.  Invalid input(s): "FREET3"  Other results:   Imaging    No results found.   Medications:     Scheduled Medications:  aspirin EC  81 mg Oral Daily   atorvastatin  40 mg Oral Daily   dapagliflozin propanediol  10 mg Oral Daily   furosemide  60 mg Intravenous BID   metoprolol succinate  25 mg Oral Daily   potassium chloride  40 mEq Oral Once  sacubitril-valsartan  1 tablet Oral BID   sodium chloride flush  3 mL Intravenous Q12H   spironolactone  25 mg Oral Daily   tamsulosin  0.4 mg Oral Daily    Infusions:  magnesium sulfate bolus IVPB      PRN Medications: acetaminophen **OR** acetaminophen, guaiFENesin, hydrALAZINE, ipratropium-albuterol, metoprolol tartrate, ondansetron (ZOFRAN) IV, polyethylene glycol, senna-docusate, traZODone   Assessment/Plan   1. Acute on chronic systolic CHF: Echo from [id number] with EF 25-30%, echo this admission with EF 20-25%, mild RV dysfunction.  Cause of cardiomyopathy is uncertain.  He has refused diagnostic catheterization in the past and he continues to refuse this admission.  Prior to admission, he says that he was not  taking his meds.  He has diuresed well here, still with peripheral edema but no JVD.  He is very eager to go home.  - Will need close followup after this admission, long discussion about the need to take his medications.  Will need to make sure he can get all his meds.  - Lasix 60 mg IV x 1 this morning then start torsemide 20 mg daily tomorrow.  - Continue Entresto 24/26 bid  - Continue Toprol XL 25 mg dailly.  - Continue dapagliflozin 10 mg daily.  - Increase spironolactone to 25 mg daily.  - Ideally would have right/left cath to assess cardiac output and for coronary disease as cause of cardiomyopathy but he refuses.  - Will try to get cardiac MRI as outpatient.   2. Hyperlipidemia: He is on atorvastatin.  3. HTN: BP controlled.  4. NSVT: Patient had a 6 beat run NSVT.   - Replace K and Mg.  - Toprol XL 25 daily.  - EF is low enough to qualify for ICD but refusal of cath/ischemic workup and lack of compliance with GDMT as outpatient makes this difficult to recommend.   I think he can go home this afternoon.  Will need close followup in CHF clinic. Need to make sure he can get all his meds.  Meds for discharge: Torsemide 20 mg daily, KCl 20 mEq daily, Entresto 24/26 bid, ASA 81, atorvastatin 40, Farxiga 10 (or Jardiance), Entresto 24/26 bid, spironolactone 25 daily, Toprol XL 25 daily.   Length of Stay: [place], MD  11/22/2022, 8:03 AM  Advanced Heart Failure Team Pager [phone] (M-F; 7a - 5p)  Please contact [facility] for night-coverage after hours (5p -7a ) and weekends on [email or web address]

## 2022-11-22 NOTE — Progress Notes (Signed)
Patient had a 5 beat run of v-tach at [phone]. BP 111/70, map 83, temp 97.9, HR 83, RR 20, and 99% on RA. Patient is asymptomatic at this time. Sharion Settler NP notified.

## 2022-11-22 NOTE — Progress Notes (Addendum)
   Heart Failure Nurse Navigator Note  Met with patient 2 separate times this morning, each time he was sitting on the bed in no acute distress.  He continues to wear the thigh-high teds socks.  This time we discussed being compliant with his medications and taking as ordered.  He again voices concern over his medications not agreeing with him.  Discussed taking as ordered and if he feels that he is having a problem to please notify Ms. Hackney in the outpatient heart failure clinic.  Patient was given a written list of his heart medications, he was able to read the medications back to me and how often he was to be taking them.  States that he sets his medications up in a pillbox, discussed continuing that practice.  Also discussed the importance of daily weights reporting 2 pound weight gain overnight or 5 pounds in a week or if he sees changes in his symptoms such as increasing shortness of breath and increase in his swelling.  He is to call Ms. Hackney.  He was given written information with her number.  Also went over low-sodium diet.  Fluid restriction.  Instructed to continue to wear the thigh-high TED hose and to take them off in the evening when going to bed and to place them on first thing in the morning.  He voices understanding.  Also discussed his follow-up appointments.  He had no further questions.  Pricilla Riffle RN [facility]

## 2022-11-22 NOTE — Progress Notes (Signed)
Mobility Specialist - Progress Note    11/22/22 0900  Mobility  Activity Ambulated independently in hallway;Stood at bedside;Dangled on edge of bed  Level of Assistance Contact guard assist, steadying assist  Assistive Device None (Pt had some wobbles(held onto him just for safety))  Distance Ambulated (ft) 350 ft  Range of Motion/Exercises Active  Activity Response Tolerated well  Mobility Referral Yes  $Mobility charge 1 Mobility   Pt resting EOB upon entry on RA. Pt STS and ambulates to hallway indep. in hallway around NS (CGA just for safety due to some stability wobbles). Pt returned to EOB and left with needs in reach.   Loma Sender Mobility Specialist 11/22/22, 9:26 AM

## 2022-11-22 NOTE — Discharge Summary (Signed)
Physician Discharge Summary  Scott Meyer [id number] DOB: [birth year] DOA: 11/18/2022  PCP: Juline Patch, MD  Admit date: 11/18/2022 Discharge date: 11/22/2022  Admitted From: Home Disposition:  Home  Recommendations for Outpatient Follow-up:  Follow up with PCP in 1-2 weeks Please obtain BMP/CBC in one week your next doctors visit.  Torsemide, KCL, Entresto, ASA, Atorvastatin, Farxiga, Aldactone and toprol XL ordered  Follow up outpatient Cardiology.    Discharge Condition: Stable CODE STATUS: Full Diet recommendation: Heart healthy.   Brief/Interim Summary: 72 year old with congestive heart failure with reduced ejection fraction, HTN comes to the hospital with shortness of breath, increasing lower extremity swelling.  He was diagnosed with CHF exacerbation and admitted to the hospital.  Upon admission patient was started on diuretics, echocardiogram this admission shows EF of 20-25% with global hypokinesia and reduced RV function.  Cardiology team is following.  Ongoing aggressive diuresis, and patient is not agreeable for any ischemic evaluation at this time. Eventually she was seen by CHF team and medications were adjusted.  Plans to dc him today without patient up and meds as listed above.      Assessment & Plan:  Principal Problem:   Anasarca Active Problems:   Pulmonary edema   Amputation of right arm (HCC)   Lower urinary tract symptoms (LUTS)   Acidosis   LFT elevation   Troponin I above reference range   Acute exacerbation of CHF (congestive heart failure) (HCC)       Assessment and Plan: * Anasarca Acute congestive heart failure with reduced ejection fraction, 25%, class IV Pulmonary edema with small left-sided pleural effusion Updated echocardiogram this admission shows EF of 20-25% with global hypokinesia.  Continue aggressive IV diuresis, replete electrolytes as necessary.  Patient is refusing ischemic evaluation at this time.  GDMT per cardiology= meds as  mentioned above.  Entresto CoPay is 0 for him per pharmacy.  Lower extremity TED hose/compression stockings to help with the swelling   Hypokalemia/hypomagnesemia - Repletion   Amputation of right arm ([place]) Secondary to trauma in 1970s fall precuations   Transaminitis, improved Likely viral hepatic congestion.  CK levels are normal   Metabolic acidosis Resolved.    Lower urinary tract symptoms (LUTS) UA is unremarkable      Discharge Diagnoses:  Principal Problem:   Anasarca Active Problems:   Pulmonary edema   Amputation of right arm (HCC)   Lower urinary tract symptoms (LUTS)   Acidosis   LFT elevation   Troponin I above reference range   Acute exacerbation of CHF (congestive heart failure) [facility])      Consultations: [facility] Cardiology.   Subjective: Feels better, ambulating without any issues.  Feels ok.   Discharge Exam: Vitals:   11/22/22 0654 11/22/22 0854  BP: 111/70 114/70  Pulse: 83 79  Resp: 20 20  Temp: 97.9 F (36.6 C) 97.8 F (36.6 C)  SpO2: 99% 100%   Vitals:   11/22/22 0124 11/22/22 0512 11/22/22 0654 11/22/22 0854  BP: 113/71 103/63 111/70 114/70  Pulse: 81 79 83 79  Resp: '20 17 20 20  '$ Temp: 97.7 F (36.5 C) 97.8 F (36.6 C) 97.9 F (36.6 C) 97.8 F (36.6 C)  TempSrc: Oral Oral Oral Oral  SpO2: 98% 98% 99% 100%  Weight:      Height:        General: Pt is alert, awake, not in acute distress Cardiovascular: RRR, S1/S2 +, no rubs, no gallops. +8cm JVD Respiratory: CTA bilaterally, no wheezing, no rhonchi Abdominal:  Soft, NT, ND, bowel sounds + Extremities: 1-2+ b/l LE edema.   Discharge Instructions   Allergies as of 11/22/2022   No Known Allergies      Medication List     STOP taking these medications    furosemide 40 MG tablet Commonly known as: LASIX   losartan 25 MG tablet Commonly known as: COZAAR   nitroGLYCERIN 0.4 MG SL tablet Commonly known as: NITROSTAT       TAKE these medications    aspirin EC  81 MG tablet Take 1 tablet (81 mg total) by mouth daily. Swallow whole.   atorvastatin 40 MG tablet Commonly known as: LIPITOR Take 1 tablet (40 mg total) by mouth daily. TAKE 1 TABLET(40 MG) BY MOUTH DAILY Strength: 40 mg   dapagliflozin propanediol 10 MG Tabs tablet Commonly known as: FARXIGA Take 1 tablet (10 mg total) by mouth daily.   metoprolol succinate 25 MG 24 hr tablet Commonly known as: TOPROL-XL Take 1 tablet (25 mg total) by mouth daily. What changed: See the new instructions.   Potassium Chloride ER 20 MEQ Tbcr Take 0.5 tablets (10 mEq total) by mouth daily. What changed: medication strength   sacubitril-valsartan 24-26 MG Commonly known as: ENTRESTO Take 1 tablet by mouth 2 (two) times daily.   spironolactone 25 MG tablet Commonly known as: ALDACTONE Take 1 tablet (25 mg total) by mouth daily.   tamsulosin 0.4 MG Caps capsule Commonly known as: FLOMAX Take 1 capsule (0.4 mg total) by mouth daily.   torsemide 20 MG tablet Commonly known as: DEMADEX Take 1 tablet (20 mg total) by mouth daily. Start taking on: November 23, 2022        Follow-up Information     Juline Patch, MD. Go in 1 week(s).   Specialty: Family Medicine Why: Appointment on Thursday, 11/28/2022 at 2:20pm. Contact information: [place] [address] [phone]                No Known Allergies  You were cared for by a hospitalist during your hospital stay. If you have any questions about your discharge medications or the care you received while you were in the hospital after you are discharged, you can call the unit and asked to speak with the hospitalist on call if the hospitalist that took care of you is not available. Once you are discharged, your primary care physician will handle any further medical issues. Please note that no refills for any discharge medications will be authorized once you are discharged, as it is imperative that you return to  your primary care physician (or establish a relationship with a primary care physician if you do not have one) for your aftercare needs so that they can reassess your need for medications and monitor your lab values.  You were cared for by a hospitalist during your hospital stay. If you have any questions about your discharge medications or the care you received while you were in the hospital after you are discharged, you can call the unit and asked to speak with the hospitalist on call if the hospitalist that took care of you is not available. Once you are discharged, your primary care physician will handle any further medical issues. Please note that NO REFILLS for any discharge medications will be authorized once you are discharged, as it is imperative that you return to your primary care physician (or establish a relationship with a primary care physician if you do not have one)  for your aftercare needs so that they can reassess your need for medications and monitor your lab values.  Please request your Prim.MD to go over all Hospital Tests and Procedure/Radiological results at the follow up, please get all Hospital records sent to your Prim MD by signing hospital release before you go home.  Get CBC, CMP, 2 view Chest X ray checked  by Primary MD during your next visit or SNF MD in 5-7 days ( we routinely change or add medications that can affect your baseline labs and fluid status, therefore we recommend that you get the mentioned basic workup next visit with your PCP, your PCP may decide not to get them or add new tests based on their clinical decision)  On your next visit with your primary care physician please Get Medicines reviewed and adjusted.  If you experience worsening of your admission symptoms, develop shortness of breath, life threatening emergency, suicidal or homicidal thoughts you must seek medical attention immediately by calling 911 or calling your MD immediately  if symptoms less  severe.  You Must read complete instructions/literature along with all the possible adverse reactions/side effects for all the Medicines you take and that have been prescribed to you. Take any new Medicines after you have completely understood and accpet all the possible adverse reactions/side effects.   Do not drive, operate heavy machinery, perform activities at heights, swimming or participation in water activities or provide baby sitting services if your were admitted for syncope or siezures until you have seen by Primary MD or a Neurologist and advised to do so again.  Do not drive when taking Pain medications.   Procedures/Studies: ECHOCARDIOGRAM COMPLETE  Result Date: 11/19/2022    ECHOCARDIOGRAM REPORT   Patient Name:   Scott Meyer Date of Exam: 11/19/2022 Medical Rec #:  [id number]     Height:       75.0 in Accession #:    [id number]    Weight:       259.0 lb Date of Birth:  10-05-1950      BSA:          2.449 m Patient Age:    25 years      BP:           120/74 mmHg Patient Gender: M             HR:           80 bpm. Exam Location:  [facility] Procedure: 2D Echo, Cardiac Doppler, Color Doppler and Intracardiac            Opacification Agent Indications:     Cardiomyopathy-unspecified I42.9  History:         Patient has prior history of Echocardiogram examinations, most                  recent 11/28/2020. Cardiomyopathy and CHF; Risk                  Factors:Hypertension.  Sonographer:     Sherrie Sport Referring Phys:  [id number] Jeanella Flattery Jacquez Sheetz Diagnosing Phys: Nelva Bush MD  Sonographer Comments: Suboptimal apical window. IMPRESSIONS  1. Left ventricular ejection fraction, by estimation, is 20 to 25%. The left ventricle has severely decreased function. The left ventricle demonstrates global hypokinesis. The left ventricular internal cavity size was moderately dilated. There is mild left ventricular hypertrophy. Left ventricular diastolic parameters are consistent with Grade II diastolic dysfunction  (pseudonormalization). Elevated left atrial pressure.  2. Right ventricular systolic function  is mildly reduced. The right ventricular size is mildly enlarged. Tricuspid regurgitation signal is inadequate for assessing PA pressure.  3. Moderate pleural effusion in the left lateral region.  4. The mitral valve is abnormal. Mild to moderate mitral valve regurgitation. No evidence of mitral stenosis.  5. The aortic valve has an indeterminant number of cusps. There is mild calcification of the aortic valve. There is mild thickening of the aortic valve. Aortic valve regurgitation is not visualized. Aortic valve sclerosis/calcification is present, without any evidence of aortic stenosis.  6. Pulmonic valve regurgitation not well assessed. FINDINGS  Left Ventricle: Left ventricular ejection fraction, by estimation, is 20 to 25%. The left ventricle has severely decreased function. The left ventricle demonstrates global hypokinesis. Definity contrast agent was given IV to delineate the left ventricular endocardial borders. The left ventricular internal cavity size was moderately dilated. There is mild left ventricular hypertrophy. Left ventricular diastolic parameters are consistent with Grade II diastolic dysfunction (pseudonormalization).  Elevated left atrial pressure. Right Ventricle: The right ventricular size is mildly enlarged. No increase in right ventricular wall thickness. Right ventricular systolic function is mildly reduced. Tricuspid regurgitation signal is inadequate for assessing PA pressure. Left Atrium: Left atrial size was normal in size. Right Atrium: Right atrial size was normal in size. Pericardium: The pericardium was not well visualized. Mitral Valve: The mitral valve is abnormal. There is mild thickening of the mitral valve leaflet(s). Mild to moderate mitral valve regurgitation. No evidence of mitral valve stenosis. Tricuspid Valve: The tricuspid valve is not well visualized. Tricuspid valve  regurgitation is trivial. Aortic Valve: The aortic valve has an indeterminant number of cusps. There is mild calcification of the aortic valve. There is mild thickening of the aortic valve. Aortic valve regurgitation is not visualized. Aortic valve sclerosis/calcification is present, without any evidence of aortic stenosis. Aortic valve mean gradient measures 2.0 mmHg. Aortic valve peak gradient measures 4.1 mmHg. Aortic valve area, by VTI measures 2.28 cm. Pulmonic Valve: The pulmonic valve was not well visualized. Pulmonic valve regurgitation not well assessed. Aorta: The aortic root is normal in size and structure. Pulmonary Artery: The pulmonary artery is not well seen. Venous: The inferior vena cava was not well visualized. IAS/Shunts: The interatrial septum was not well visualized. Additional Comments: There is a moderate pleural effusion in the left lateral region.  LEFT VENTRICLE PLAX 2D LVIDd:         6.40 cm      Diastology LVIDs:         5.90 cm      LV e' medial:    2.72 cm/s LV PW:         1.20 cm      LV E/e' medial:  37.9 LV IVS:        1.20 cm      LV e' lateral:   15.40 cm/s LVOT diam:     2.20 cm      LV E/e' lateral: 6.7 LV SV:         37 LV SV Index:   15 LVOT Area:     3.80 cm  LV Volumes (MOD) LV vol d, MOD A2C: 284.0 ml LV vol d, MOD A4C: 199.0 ml LV vol s, MOD A2C: 202.0 ml LV vol s, MOD A4C: 179.0 ml LV SV MOD A2C:     82.0 ml LV SV MOD A4C:     199.0 ml LV SV MOD BP:      48.6 ml RIGHT VENTRICLE RV  Basal diam:  5.40 cm RV Mid diam:    4.30 cm LEFT ATRIUM             Index        RIGHT ATRIUM           Index LA diam:        3.80 cm 1.55 cm/m   RA Area:     20.30 cm LA Vol (A2C):   93.7 ml 38.26 ml/m  RA Volume:   68.90 ml  28.13 ml/m LA Vol (A4C):   35.8 ml 14.62 ml/m LA Biplane Vol: 62.6 ml 25.56 ml/m  AORTIC VALVE AV Area (Vmax):    2.23 cm AV Area (Vmean):   2.43 cm AV Area (VTI):     2.28 cm AV Vmax:           100.90 cm/s AV Vmean:          66.150 cm/s AV VTI:            0.163  m AV Peak Grad:      4.1 mmHg AV Mean Grad:      2.0 mmHg LVOT Vmax:         59.10 cm/s LVOT Vmean:        42.200 cm/s LVOT VTI:          0.098 m LVOT/AV VTI ratio: 0.60  AORTA Ao Root diam: 3.40 cm MITRAL VALVE MV Area (PHT): 5.06 cm     SHUNTS MV Decel Time: 150 msec     Systemic VTI:  0.10 m MV E velocity: 103.00 cm/s  Systemic Diam: 2.20 cm MV A velocity: 71.30 cm/s MV E/A ratio:  1.44 Harrell Gave End MD Electronically signed by Nelva Bush MD Signature Date/Time: 11/19/2022/3:29:09 PM    Final    DG Chest Portable 1 View  Result Date: 11/18/2022 CLINICAL DATA:  sob EXAM: PORTABLE CHEST 1 VIEW COMPARISON:  11/27/2020 FINDINGS: Mild cardiac enlargement with slight central vascular prominence. No definite CHF pattern. Right lung remains clear. Increased left lower lobe opacity obscures left hemidiaphragm suspicious for atelectasis versus pneumonia. Difficult to exclude small left effusion. No pneumothorax. Trachea midline. No acute osseous finding. IMPRESSION: 1. Left lower lobe atelectasis versus pneumonia. 2. Suspect small left effusion. Electronically Signed   By: Jerilynn Mages.  Shick M.D.   On: 11/18/2022 16:35     The results of significant diagnostics from this hospitalization (including imaging, microbiology, ancillary and laboratory) are listed below for reference.     Microbiology: Recent Results (from the past 240 hour(s))  Resp panel by RT-PCR (RSV, Flu A&B, Covid) Anterior Nasal Swab     Status: None   Collection Time: 11/18/22  4:19 PM   Specimen: Anterior Nasal Swab  Result Value Ref Range Status   SARS Coronavirus 2 by RT PCR NEGATIVE NEGATIVE Final    Comment: (NOTE) SARS-CoV-2 target nucleic acids are NOT DETECTED.  The SARS-CoV-2 RNA is generally detectable in upper respiratory specimens during the acute phase of infection. The lowest concentration of SARS-CoV-2 viral copies this assay can detect is 138 copies/mL. A negative result does not preclude SARS-Cov-2 infection and should  not be used as the sole basis for treatment or other patient management decisions. A negative result may occur with  improper specimen collection/handling, submission of specimen other than nasopharyngeal swab, presence of viral mutation(s) within the areas targeted by this assay, and inadequate number of viral copies(<138 copies/mL). A negative result must be combined with clinical observations, patient history, and  epidemiological information. The expected result is Negative.  Fact Sheet for Patients:  EntrepreneurPulse.com.au  Fact Sheet for Healthcare Providers:  IncredibleEmployment.be  This test is no t yet approved or cleared by the [place] FDA and  has been authorized for detection and/or diagnosis of SARS-CoV-2 by FDA under an Emergency Use Authorization (EUA). This EUA will remain  in effect (meaning this test can be used) for the duration of the COVID-19 declaration under Section 564(b)(1) of the Act, 21 U.S.C.section 360bbb-3(b)(1), unless the authorization is terminated  or revoked sooner.       Influenza A by PCR NEGATIVE NEGATIVE Final   Influenza B by PCR NEGATIVE NEGATIVE Final    Comment: (NOTE) The Xpert Xpress SARS-CoV-2/FLU/RSV plus assay is intended as an aid in the diagnosis of influenza from Nasopharyngeal swab specimens and should not be used as a sole basis for treatment. Nasal washings and aspirates are unacceptable for Xpert Xpress SARS-CoV-2/FLU/RSV testing.  Fact Sheet for Patients: EntrepreneurPulse.com.au  Fact Sheet for Healthcare Providers: IncredibleEmployment.be  This test is not yet approved or cleared by the [place] FDA and has been authorized for detection and/or diagnosis of SARS-CoV-2 by FDA under an Emergency Use Authorization (EUA). This EUA will remain in effect (meaning this test can be used) for the duration of the COVID-19 declaration under  Section 564(b)(1) of the Act, 21 U.S.C. section 360bbb-3(b)(1), unless the authorization is terminated or revoked.     Resp Syncytial Virus by PCR NEGATIVE NEGATIVE Final    Comment: (NOTE) Fact Sheet for Patients: EntrepreneurPulse.com.au  Fact Sheet for Healthcare Providers: IncredibleEmployment.be  This test is not yet approved or cleared by the [place] FDA and has been authorized for detection and/or diagnosis of SARS-CoV-2 by FDA under an Emergency Use Authorization (EUA). This EUA will remain in effect (meaning this test can be used) for the duration of the COVID-19 declaration under Section 564(b)(1) of the Act, 21 U.S.C. section 360bbb-3(b)(1), unless the authorization is terminated or revoked.  Performed at [facility], [address] [place], [place] [address]      Labs: BNP (last 3 results) Recent Labs    11/18/22 1619  BNP [id number]*   Basic Metabolic Panel: Recent Labs  Lab 11/18/22 1735 11/19/22 0639 11/20/22 0145 11/21/22 0448 11/22/22 0519  NA 137 137 139 138 134*  K 4.2 3.8 3.5 3.9 3.5  CL 108 108 106 106 102  CO2 18* 21* '25 23 25  '$ GLUCOSE 91 87 121* 79 96  BUN '23 22 23 20 19  '$ CREATININE 1.11 1.13 1.16 1.14 1.14  CALCIUM 9.0 8.9 8.9 8.7* 8.6*  MG  --  1.6* 1.7 1.9 1.7   Liver Function Tests: Recent Labs  Lab 11/18/22 1735 11/19/22 0639  AST 48* 39  ALT 48* 42  ALKPHOS 92 80  BILITOT 2.3* 2.4*  PROT 6.7 6.3*  ALBUMIN 3.3* 3.1*   No results for input(s): "LIPASE", "AMYLASE" in the last 168 hours. No results for input(s): "AMMONIA" in the last 168 hours. CBC: Recent Labs  Lab 11/18/22 1619 11/19/22 0639 11/20/22 0145 11/21/22 0448 11/22/22 0519  WBC 6.5 5.8 5.4 5.4 5.3  NEUTROABS 4.3  --   --   --   --   HGB 14.5 13.6 12.3* 12.4* 13.4  HCT 47.5 44.0 38.1* 38.9* 41.3  MCV 100.4* 99.3 94.8 96.0 95.6  PLT 162 141* 132* 148* 154   Cardiac Enzymes: Recent Labs  Lab  11/18/22 1743  CKTOTAL 296  BNP: Invalid input(s): "POCBNP" CBG: No results for input(s): "GLUCAP" in the last 168 hours. D-Dimer No results for input(s): "DDIMER" in the last 72 hours. Hgb A1c No results for input(s): "HGBA1C" in the last 72 hours. Lipid Profile Recent Labs    11/20/22 0145  CHOL 115  HDL 27*  LDLCALC 79  TRIG 45  CHOLHDL 4.3   Thyroid function studies No results for input(s): "TSH", "T4TOTAL", "T3FREE", "THYROIDAB" in the last 72 hours.  Invalid input(s): "FREET3" Anemia work up No results for input(s): "VITAMINB12", "FOLATE", "FERRITIN", "TIBC", "IRON", "RETICCTPCT" in the last 72 hours. Urinalysis    Component Value Date/Time   COLORURINE COLORLESS (A) 11/18/2022 2129   APPEARANCEUR CLEAR (A) 11/18/2022 2129   LABSPEC 1.004 (L) 11/18/2022 2129   PHURINE 6.0 11/18/2022 2129   GLUCOSEU NEGATIVE 11/18/2022 2129   HGBUR NEGATIVE 11/18/2022 2129   BILIRUBINUR NEGATIVE 11/18/2022 2129   KETONESUR NEGATIVE 11/18/2022 2129   PROTEINUR NEGATIVE 11/18/2022 2129   NITRITE NEGATIVE 11/18/2022 2129   LEUKOCYTESUR NEGATIVE 11/18/2022 2129   Sepsis Labs Recent Labs  Lab 11/19/22 0639 11/20/22 0145 11/21/22 0448 11/22/22 0519  WBC 5.8 5.4 5.4 5.3   Microbiology Recent Results (from the past 240 hour(s))  Resp panel by RT-PCR (RSV, Flu A&B, Covid) Anterior Nasal Swab     Status: None   Collection Time: 11/18/22  4:19 PM   Specimen: Anterior Nasal Swab  Result Value Ref Range Status   SARS Coronavirus 2 by RT PCR NEGATIVE NEGATIVE Final    Comment: (NOTE) SARS-CoV-2 target nucleic acids are NOT DETECTED.  The SARS-CoV-2 RNA is generally detectable in upper respiratory specimens during the acute phase of infection. The lowest concentration of SARS-CoV-2 viral copies this assay can detect is 138 copies/mL. A negative result does not preclude SARS-Cov-2 infection and should not be used as the sole basis for treatment or other patient management  decisions. A negative result may occur with  improper specimen collection/handling, submission of specimen other than nasopharyngeal swab, presence of viral mutation(s) within the areas targeted by this assay, and inadequate number of viral copies(<138 copies/mL). A negative result must be combined with clinical observations, patient history, and epidemiological information. The expected result is Negative.  Fact Sheet for Patients:  EntrepreneurPulse.com.au  Fact Sheet for Healthcare Providers:  IncredibleEmployment.be  This test is no t yet approved or cleared by the [place] FDA and  has been authorized for detection and/or diagnosis of SARS-CoV-2 by FDA under an Emergency Use Authorization (EUA). This EUA will remain  in effect (meaning this test can be used) for the duration of the COVID-19 declaration under Section 564(b)(1) of the Act, 21 U.S.C.section 360bbb-3(b)(1), unless the authorization is terminated  or revoked sooner.       Influenza A by PCR NEGATIVE NEGATIVE Final   Influenza B by PCR NEGATIVE NEGATIVE Final    Comment: (NOTE) The Xpert Xpress SARS-CoV-2/FLU/RSV plus assay is intended as an aid in the diagnosis of influenza from Nasopharyngeal swab specimens and should not be used as a sole basis for treatment. Nasal washings and aspirates are unacceptable for Xpert Xpress SARS-CoV-2/FLU/RSV testing.  Fact Sheet for Patients: EntrepreneurPulse.com.au  Fact Sheet for Healthcare Providers: IncredibleEmployment.be  This test is not yet approved or cleared by the [place] FDA and has been authorized for detection and/or diagnosis of SARS-CoV-2 by FDA under an Emergency Use Authorization (EUA). This EUA will remain in effect (meaning this test can be used) for the duration of the COVID-19  declaration under Section 564(b)(1) of the Act, 21 U.S.C. section 360bbb-3(b)(1), unless the  authorization is terminated or revoked.     Resp Syncytial Virus by PCR NEGATIVE NEGATIVE Final    Comment: (NOTE) Fact Sheet for Patients: EntrepreneurPulse.com.au  Fact Sheet for Healthcare Providers: IncredibleEmployment.be  This test is not yet approved or cleared by the [place] FDA and has been authorized for detection and/or diagnosis of SARS-CoV-2 by FDA under an Emergency Use Authorization (EUA). This EUA will remain in effect (meaning this test can be used) for the duration of the COVID-19 declaration under Section 564(b)(1) of the Act, 21 U.S.C. section 360bbb-3(b)(1), unless the authorization is terminated or revoked.  Performed at [facility], [address] [place], [place] [address]      Time coordinating discharge:  I have spent 35 minutes face to face with the patient and on the ward discussing the patients care, assessment, plan and disposition with other care givers. >50% of the time was devoted counseling the patient about the risks and benefits of treatment/Discharge disposition and coordinating care.   SIGNED:   Damita Lack, MD  [facility] 11/22/2022, 11:21 AM   If 7PM-7AM, please contact night-coverage

## 2022-11-22 NOTE — Plan of Care (Signed)

## 2022-11-25 ENCOUNTER — Telehealth: Payer: Self-pay

## 2022-11-25 NOTE — Transitions of Care (Post Inpatient/ED Visit) (Signed)
   11/25/2022  Name: Scott Meyer MRN: [id number] DOB: 05/06/1951  Today's TOC FU Call Status: Today's TOC FU Call Status:: Successful TOC FU Call Competed TOC FU Call Complete Date: 11/25/22  Transition Care Management Follow-up Telephone Call Date of Discharge: 11/22/22 Discharge Facility: [facility]) Type of Discharge: Inpatient Admission Primary Inpatient Discharge Diagnosis:: heart failure How have you been since you were released from the hospital?: Better Any questions or concerns?: Yes  Items Reviewed: Did you receive and understand the discharge instructions provided?: Yes Medications obtained and verified?: Yes (Medications Reviewed) Any new allergies since your discharge?: No Dietary orders reviewed?: Yes Do you have support at home?: Yes People in Home: spouse  Home Care and Equipment/Supplies: [place] Ordered?: NA Any new equipment or medical supplies ordered?: NA  Functional Questionnaire: Do you need assistance with bathing/showering or dressing?: No Do you need assistance with meal preparation?: No Do you need assistance with eating?: No Do you have difficulty maintaining continence: No Do you need assistance with getting out of bed/getting out of a chair/moving?: No Do you have difficulty managing or taking your medications?: No  Folllow up appointments reviewed: PCP Follow-up appointment confirmed?: Yes Date of PCP follow-up appointment?: 11/28/22 Follow-up Provider: Dr [facility] Follow-up appointment confirmed?: No Reason Specialist Follow-Up Not Confirmed: Patient has Specialist Provider Number and will Call for Appointment Do you need transportation to your follow-up appointment?: No Do you understand care options if your condition(s) worsen?: Yes-patient verbalized understanding    SIGNATURE Juanda Crumble, [place] Nurse Health Advisor Direct Dial [phone]

## 2022-11-27 ENCOUNTER — Encounter: Payer: 59 | Admitting: Family

## 2022-11-28 ENCOUNTER — Encounter: Payer: Self-pay | Admitting: Family Medicine

## 2022-11-28 ENCOUNTER — Ambulatory Visit (INDEPENDENT_AMBULATORY_CARE_PROVIDER_SITE_OTHER): Payer: 59 | Admitting: Family Medicine

## 2022-11-28 VITALS — BP 128/62 | HR 67 | Ht 75.0 in | Wt 220.0 lb

## 2022-11-28 DIAGNOSIS — Z8639 Personal history of other endocrine, nutritional and metabolic disease: Secondary | ICD-10-CM | POA: Diagnosis not present

## 2022-11-28 DIAGNOSIS — D649 Anemia, unspecified: Secondary | ICD-10-CM | POA: Diagnosis not present

## 2022-11-28 DIAGNOSIS — I5041 Acute combined systolic (congestive) and diastolic (congestive) heart failure: Secondary | ICD-10-CM | POA: Diagnosis not present

## 2022-11-28 DIAGNOSIS — I420 Dilated cardiomyopathy: Secondary | ICD-10-CM | POA: Diagnosis not present

## 2022-11-28 NOTE — Progress Notes (Signed)
Date:  11/28/2022   Name:  Scott Meyer   DOB:  Apr 22, 1951   MRN:  [id number]   Chief Complaint: Hospitalization Follow-up (Admitted March 4-Discharged on 3/8- TOC placed on 11/25/22- CHF. Seeing cardio this month)  Follow up Hospitalization  Patient was admitted to [facility] on 3/4 and discharged on 3/8. He was treated for acute on chronic CHF/anasarca. Treatment for this included massive diuresis. Telephone follow up was done on 3/11 He reports good compliance with treatment. He reports this condition is improved.  ----------------------------------------------------------------------------------------- -     Congestive Heart Failure Presents for follow-up visit. Associated symptoms include edema, nocturia and shortness of breath. Pertinent negatives include no chest pain, chest pressure, orthopnea, palpitations, paroxysmal nocturnal dyspnea or unexpected weight change. The symptoms have been improving. Compliance problems include adherence to diet ("no salt").     Lab Results  Component Value Date   NA 134 (L) 11/22/2022   K 3.5 11/22/2022   CO2 25 11/22/2022   GLUCOSE 96 11/22/2022   BUN 19 11/22/2022   CREATININE 1.14 11/22/2022   CALCIUM 8.6 (L) 11/22/2022   GFRNONAA >60 11/22/2022   Lab Results  Component Value Date   CHOL 115 11/20/2022   HDL 27 (L) 11/20/2022   LDLCALC 79 11/20/2022   TRIG 45 11/20/2022   CHOLHDL 4.3 11/20/2022   Lab Results  Component Value Date   TSH 1.745 11/27/2020   Lab Results  Component Value Date   HGBA1C 5.5 11/19/2022   Lab Results  Component Value Date   WBC 5.3 11/22/2022   HGB 13.4 11/22/2022   HCT 41.3 11/22/2022   MCV 95.6 11/22/2022   PLT 154 11/22/2022   Lab Results  Component Value Date   ALT 42 11/19/2022   AST 39 11/19/2022   ALKPHOS 80 11/19/2022   BILITOT 2.4 (H) 11/19/2022   No results found for: "25OHVITD2", "25OHVITD3", "VD25OH"   Review of Systems  Constitutional:  Negative for unexpected weight  change.  HENT:  Negative for congestion.   Respiratory:  Positive for shortness of breath. Negative for cough, chest tightness and wheezing.   Cardiovascular:  Positive for leg swelling. Negative for chest pain and palpitations.  Endocrine: Negative for polydipsia and polyuria.  Genitourinary:  Positive for nocturia. Negative for difficulty urinating.    Patient Active Problem List   Diagnosis Date Noted   Acute exacerbation of CHF (congestive heart failure) ([place]) 11/19/2022   Anasarca 11/18/2022   Pulmonary edema 11/18/2022   Lower urinary tract symptoms (LUTS) 11/18/2022   Acidosis 11/18/2022   LFT elevation 11/18/2022   Troponin I above reference range 11/18/2022   Paronychia of left index finger 06/13/2022   Dilated cardiomyopathy ([place]) 11/30/2020   Atrial tachycardia 11/30/2020   Frequent PVCs 11/30/2020   Troponin level elevated 11/29/2020   Benign essential HTN 11/29/2020   HLD (hyperlipidemia) 11/29/2020   Hypomagnesemia 11/29/2020   Amputation of right arm ([place]) 11/29/2020   Acute combined systolic and diastolic congestive heart failure ([place]) 11/29/2020   NSVT (nonsustained ventricular tachycardia) ([place]) 11/29/2020   Chest pain 11/27/2020    No Known Allergies  Past Surgical History:  Procedure Laterality Date   ARM AMPUTATION AT ELBOW Right    KNEE SURGERY      Social History   Tobacco Use   Smoking status: Former    Types: Cigarettes    Quit date: 10/09/2017    Years since quitting: 5.1   Smokeless tobacco: Never  Vaping Use   Vaping Use:  Never used  Substance Use Topics   Alcohol use: No    Comment: quit 8 months ago   Drug use: No     Medication list has been reviewed and updated.  Current Meds  Medication Sig   aspirin EC 81 MG tablet Take 1 tablet (81 mg total) by mouth daily. Swallow whole.   atorvastatin (LIPITOR) 40 MG tablet Take 1 tablet (40 mg total) by mouth daily. TAKE 1 TABLET(40 MG) BY MOUTH DAILY Strength: 40 mg   dapagliflozin  propanediol (FARXIGA) 10 MG TABS tablet Take 1 tablet (10 mg total) by mouth daily.   metoprolol succinate (TOPROL-XL) 25 MG 24 hr tablet Take 1 tablet (25 mg total) by mouth daily.   potassium chloride 20 MEQ TBCR Take 0.5 tablets (10 mEq total) by mouth daily.   sacubitril-valsartan (ENTRESTO) 24-26 MG Take 1 tablet by mouth 2 (two) times daily.   spironolactone (ALDACTONE) 25 MG tablet Take 1 tablet (25 mg total) by mouth daily.   tamsulosin (FLOMAX) 0.4 MG CAPS capsule Take 1 capsule (0.4 mg total) by mouth daily.   torsemide (DEMADEX) 20 MG tablet Take 1 tablet (20 mg total) by mouth daily.       11/28/2022    2:50 PM 11/18/2022    2:53 PM 06/13/2022   10:59 AM 04/16/2022    1:11 PM  GAD 7 : Generalized Anxiety Score  Nervous, Anxious, on Edge 0 0 0 0  Control/stop worrying 0 0 0 0  Worry too much - different things 0 0 0 0  Trouble relaxing 0 0 0 0  Restless 0 0 0 0  Easily annoyed or irritable 0 0 0 0  Afraid - awful might happen 0 0 0 0  Total GAD 7 Score 0 0 0 0  Anxiety Difficulty Not difficult at all Not difficult at all Not difficult at all Not difficult at all       11/28/2022    2:49 PM 11/18/2022    2:53 PM 06/13/2022   10:58 AM  Depression screen PHQ 2/9  Decreased Interest 0 0 0  Down, Depressed, Hopeless 0 0 0  PHQ - 2 Score 0 0 0  Altered sleeping 0 0 0  Tired, decreased energy 0 0 0  Change in appetite 0 0 0  Feeling bad or failure about yourself  0 0 0  Trouble concentrating 0 0 0  Moving slowly or fidgety/restless 0 0 0  Suicidal thoughts 0 0 0  PHQ-9 Score 0 0 0  Difficult doing work/chores Not difficult at all Not difficult at all Not difficult at all    BP Readings from Last 3 Encounters:  11/28/22 128/62  11/22/22 114/70  11/18/22 (!) 128/58    Physical Exam Vitals and nursing note reviewed.  HENT:     Head: Normocephalic.     Right Ear: Tympanic membrane normal.     Left Ear: Tympanic membrane normal.     Nose: Nose normal.      Mouth/Throat:     Mouth: Mucous membranes are moist.  Cardiovascular:     Rate and Rhythm: Normal rate and regular rhythm.     Heart sounds: No murmur heard.    No friction rub. No gallop.  Pulmonary:     Breath sounds: No wheezing, rhonchi or rales.  Abdominal:     Palpations: Abdomen is soft.  Musculoskeletal:     Cervical back: Normal range of motion.  Neurological:     Mental Status: He  is alert.     Wt Readings from Last 3 Encounters:  11/28/22 220 lb (99.8 kg)  11/21/22 234 lb 5.6 oz (106.3 kg)  11/18/22 261 lb (118.4 kg)    BP 128/62   Pulse 67   Ht '6\' 3"'$  (1.905 m)   Wt 220 lb (99.8 kg)   SpO2 94%   BMI 27.50 kg/m   Assessment and Plan:  1. Dilated cardiomyopathy (HCC) Chronic.  Return to relative controlled.  Stable.  Due to patient's own noncompliance all medications were stopped when he arrived 40 pounds more congestive heart failure, pleural effusion, and anasarca.  Patient was hospitalized and diuresed over the course of 3 days.  Currently he is doing much better without shortness of breath without orthopnea/PND without dyspnea on exertion.  We will check BMP for electrolytes and BNP. - Basic metabolic panel - Brain natriuretic peptide; Future  2. Acute combined systolic and diastolic congestive heart failure ([place]) As seen above patient was admitted and acute systolic and diastolic congestive heart failure secondary to cardiomyopathy.  Patient has an upcoming appointment with heart failure clinic. - Basic metabolic panel - Brain natriuretic peptide; Future  3. Anemia, unspecified type Chronic.  Controlled.  Patient had mild decrease in hemoglobin we will recheck CBC to see if this is controlled patient was also noted to have a decrease in platelet count and will review this as well. - CBC with Differential/Platelet  4. History of acidosis Treatment was a history of acidosis due to significant congestive heart failure and patient is now with turning to  stable electrolyte balance.  We will recheck BMP for current level of control.  We will have patient return in 3 weeks for recheck particularly of weight and in general discussion overall his focusing on being compliant with medication. - Basic metabolic panel    Otilio Miu, MD

## 2022-11-29 LAB — CBC WITH DIFFERENTIAL/PLATELET
Basophils Absolute: 0 10*3/uL (ref 0.0–0.2)
Basos: 1 %
EOS (ABSOLUTE): 0.1 10*3/uL (ref 0.0–0.4)
Eos: 1 %
Hematocrit: 42.5 % (ref 37.5–51.0)
Hemoglobin: 14 g/dL (ref 13.0–17.7)
Immature Grans (Abs): 0 10*3/uL (ref 0.0–0.1)
Immature Granulocytes: 0 %
Lymphocytes Absolute: 1.2 10*3/uL (ref 0.7–3.1)
Lymphs: 20 %
MCH: 30.7 pg (ref 26.6–33.0)
MCHC: 32.9 g/dL (ref 31.5–35.7)
MCV: 93 fL (ref 79–97)
Monocytes Absolute: 0.5 10*3/uL (ref 0.1–0.9)
Monocytes: 9 %
Neutrophils Absolute: 4.2 10*3/uL (ref 1.4–7.0)
Neutrophils: 69 %
Platelets: 143 10*3/uL — ABNORMAL LOW (ref 150–450)
RBC: 4.56 x10E6/uL (ref 4.14–5.80)
RDW: 11.6 % (ref 11.6–15.4)
WBC: 6 10*3/uL (ref 3.4–10.8)

## 2022-11-29 LAB — BASIC METABOLIC PANEL
BUN/Creatinine Ratio: 18 (ref 10–24)
BUN: 24 mg/dL (ref 8–27)
CO2: 23 mmol/L (ref 20–29)
Calcium: 9.6 mg/dL (ref 8.6–10.2)
Chloride: 101 mmol/L (ref 96–106)
Creatinine, Ser: 1.31 mg/dL — ABNORMAL HIGH (ref 0.76–1.27)
Glucose: 89 mg/dL (ref 70–99)
Potassium: 4.5 mmol/L (ref 3.5–5.2)
Sodium: 138 mmol/L (ref 134–144)
eGFR: 58 mL/min/{1.73_m2} — ABNORMAL LOW (ref >59)

## 2022-12-05 ENCOUNTER — Encounter: Payer: 59 | Admitting: Cardiology

## 2022-12-05 NOTE — Progress Notes (Deleted)
ADVANCED HEART FAILURE CLINIC NOTE  Referring Physician: Juline Patch, MD  Primary Care: Juline Patch, MD Primary HF: Loralie Champagne, MD  HPI: Scott Meyer is a 72 y.o. male with heart failure with reduced ejection fraction, hypertension, traumatic amputation of the right forearm in 1970, previous tobacco use presenting to establish care.  [place]'s cardiac history dates back to 2022 when he was admitted with acute onset shortness of breath and lower extremity edema.  During that admission he was found to have an LVEF of 25 to 30%.  Unfortunately he was noncompliant with medications at that time and deemed a high risk candidate for left heart catheterization due to his history of noncompliance.  He was once again admitted in March   Activity level/exercise tolerance:  *** Orthopnea:  Sleeps on *** pillows Paroxysmal noctural dyspnea:  *** Chest pain/pressure:  *** Orthostatic lightheadedness:  *** Palpitations:  *** Lower extremity edema:  *** Presyncope/syncope:  *** Cough:  ***  Past Medical History:  Diagnosis Date   Cardiomyopathy ([place])    a. 11/2020 Echo: EF 25-30%, mod LVH, Gr1 DD. Mild AoV Ca2+ w/ mild to mod AoV sclerosis.   CHF (congestive heart failure) (HCC)    Hypertension     Current Outpatient Medications  Medication Sig Dispense Refill   aspirin EC 81 MG tablet Take 1 tablet (81 mg total) by mouth daily. Swallow whole. 90 tablet 0   atorvastatin (LIPITOR) 40 MG tablet Take 1 tablet (40 mg total) by mouth daily. TAKE 1 TABLET(40 MG) BY MOUTH DAILY Strength: 40 mg 90 tablet 0   dapagliflozin propanediol (FARXIGA) 10 MG TABS tablet Take 1 tablet (10 mg total) by mouth daily. 30 tablet 0   metoprolol succinate (TOPROL-XL) 25 MG 24 hr tablet Take 1 tablet (25 mg total) by mouth daily. 30 tablet 0   potassium chloride 20 MEQ TBCR Take 0.5 tablets (10 mEq total) by mouth daily. 30 tablet 0   sacubitril-valsartan (ENTRESTO) 24-26 MG Take 1 tablet by mouth 2 (two)  times daily. 60 tablet 0   spironolactone (ALDACTONE) 25 MG tablet Take 1 tablet (25 mg total) by mouth daily. 30 tablet 0   tamsulosin (FLOMAX) 0.4 MG CAPS capsule Take 1 capsule (0.4 mg total) by mouth daily. 30 capsule 0   torsemide (DEMADEX) 20 MG tablet Take 1 tablet (20 mg total) by mouth daily. 30 tablet 0   No current facility-administered medications for this visit.    No Known Allergies    Social History   Socioeconomic History   Marital status: Single    Spouse name: Not on file   Number of children: Not on file   Years of education: Not on file   Highest education level: Not on file  Occupational History   Occupation: Retired  Tobacco Use   Smoking status: Former    Types: Cigarettes    Quit date: 10/09/2017    Years since quitting: 5.1   Smokeless tobacco: Never  Vaping Use   Vaping Use: Never used  Substance and Sexual Activity   Alcohol use: No    Comment: quit 8 months ago   Drug use: No   Sexual activity: Not on file  Other Topics Concern   Not on file  Social History Narrative   Lives in [place] w/ his sister.  Does not routinely exercise.   Social Determinants of Health   Financial Resource Strain: Low Risk  (05/24/2022)   Overall Financial Resource Strain (CARDIA)  Difficulty of Paying Living Expenses: Not hard at all  Food Insecurity: No Food Insecurity (05/24/2022)   Hunger Vital Sign    Worried About Running Out of Food in the Last Year: Never true    Ran Out of Food in the Last Year: Never true  Transportation Needs: No Transportation Needs (05/24/2022)   PRAPARE - Hydrologist (Medical): No    Lack of Transportation (Non-Medical): No  Physical Activity: Inactive (05/24/2022)   Exercise Vital Sign    Days of Exercise per Week: 0 days    Minutes of Exercise per Session: 0 min  Stress: No Stress Concern Present (05/24/2022)   [place]    Feeling of  Stress : Only a little  Social Connections: Socially Isolated (05/24/2022)   Social Connection and Isolation Panel [NHANES]    Frequency of Communication with Friends and Family: Never    Frequency of Social Gatherings with Friends and Family: Never    Attends Religious Services: Never    Marine scientist or Organizations: No    Attends Archivist Meetings: Never    Marital Status: Never married  Intimate Partner Violence: Not At Risk (05/24/2022)   Humiliation, Afraid, Rape, and Kick questionnaire    Fear of Current or Ex-Partner: No    Emotionally Abused: No    Physically Abused: No    Sexually Abused: No      Family History  Problem Relation Age of Onset   Diabetes Mother    Cirrhosis Father    Alcohol abuse Father    Other Brother        gsw   Cancer Brother        'head full of tumors'   Other Brother        'killed w/ 2x4 to the head'   Other Sister        mva    PHYSICAL EXAM: There were no vitals filed for this visit. GENERAL: Well nourished, well developed, and in no apparent distress at rest.  HEENT: Negative for arcus senilis or xanthelasma. There is no scleral icterus.  The mucous membranes are pink and moist.   NECK: Supple, No masses. Normal carotid upstrokes without bruits. No masses or thyromegaly.    CHEST: There are no chest wall deformities. There is no chest wall tenderness. Respirations are unlabored.  Lungs- *** CARDIAC:  JVP: *** cm H2O         {HEART SOUNDS:22645}  Normal rate with regular rhythm. No murmurs, rubs or gallops.  Pulses are 2+ and symmetrical in upper and lower extremities. *** edema.  ABDOMEN: Soft, non-tender, non-distended. There are no masses or hepatomegaly. There are normal bowel sounds.  EXTREMITIES: Warm and well perfused with no cyanosis, clubbing.  LYMPHATIC: No axillary or supraclavicular lymphadenopathy.  NEUROLOGIC: Patient is oriented x3 with no focal or lateralizing neurologic deficits.  PSYCH: Patients  affect is appropriate, there is no evidence of anxiety or depression.  SKIN: Warm and dry; no lesions or wounds.   DATA REVIEW  ECG: 11/25/22: NSR  As per my personal interpretation  ECHO: *** As per my personal interpretation  CATH: *** As per my personal interpretation   ASSESSMENT & PLAN:  *** Etiology of HF:*** NYHA class / AHA Stage:*** Volume status & Diuretics: *** Vasodilators:*** Beta-Blocker:*** MRA:*** Cardiometabolic:*** Devices therapies & Valvulopathies:*** Advanced therapies:***   Ayelen Sciortino Advanced Heart Failure Mechanical Circulatory Support

## 2022-12-16 ENCOUNTER — Telehealth: Payer: Self-pay | Admitting: Family Medicine

## 2022-12-16 NOTE — Telephone Encounter (Signed)
Left voice mail to reschedule April 4th appt.

## 2022-12-19 ENCOUNTER — Encounter: Payer: Self-pay | Admitting: Family Medicine

## 2022-12-19 ENCOUNTER — Ambulatory Visit (INDEPENDENT_AMBULATORY_CARE_PROVIDER_SITE_OTHER): Payer: 59 | Admitting: Family Medicine

## 2022-12-19 VITALS — BP 90/70 | HR 52 | Ht 75.0 in | Wt 211.0 lb

## 2022-12-19 DIAGNOSIS — E7801 Familial hypercholesterolemia: Secondary | ICD-10-CM

## 2022-12-19 DIAGNOSIS — I10 Essential (primary) hypertension: Secondary | ICD-10-CM

## 2022-12-19 DIAGNOSIS — Z91199 Patient's noncompliance with other medical treatment and regimen due to unspecified reason: Secondary | ICD-10-CM

## 2022-12-19 DIAGNOSIS — E78019 Familial hypercholesterolemia, unspecified: Secondary | ICD-10-CM

## 2022-12-19 DIAGNOSIS — I420 Dilated cardiomyopathy: Secondary | ICD-10-CM

## 2022-12-19 MED ORDER — METOPROLOL SUCCINATE ER 25 MG PO TB24: 25.0000 mg | ORAL_TABLET | Freq: Every day | ORAL | 1 refills | Status: DC

## 2022-12-19 MED ORDER — METOPROLOL SUCCINATE ER 25 MG PO TB24: 25.0000 mg | ORAL_TABLET | Freq: Every day | ORAL | 0 refills | Status: DC

## 2022-12-19 MED ORDER — ATORVASTATIN CALCIUM 40 MG PO TABS: 40.0000 mg | ORAL_TABLET | Freq: Every day | ORAL | 0 refills | Status: DC

## 2022-12-19 MED ORDER — LISINOPRIL 10 MG PO TABS: 10.0000 mg | ORAL_TABLET | Freq: Every day | ORAL | 3 refills | Status: DC

## 2022-12-19 MED ORDER — SPIRONOLACTONE 25 MG PO TABS: 25.0000 mg | ORAL_TABLET | Freq: Every day | ORAL | 0 refills | Status: DC

## 2022-12-19 NOTE — Progress Notes (Signed)
Date:  12/19/2022   Name:  Scott Meyer   DOB:  03-07-1951   MRN:  [id number]   Chief Complaint: Edema (Follow up on Edema and SOB.)  Congestive Heart Failure Presents for follow-up visit. Associated symptoms include shortness of breath. Pertinent negatives include no abdominal pain, chest pain, edema, orthopnea, palpitations or unexpected weight change. The symptoms have been stable. Compliance problems include medication side effects.   Hypertension This is a chronic problem. The current episode started more than 1 year ago. The problem has been gradually improving since onset. The problem is controlled. Associated symptoms include shortness of breath. Pertinent negatives include no chest pain, orthopnea, palpitations or PND. There are no associated agents to hypertension. Risk factors for coronary artery disease include dyslipidemia. Past treatments include ACE inhibitors, beta blockers and diuretics. The current treatment provides moderate improvement.  Hyperlipidemia This is a chronic problem. The current episode started more than 1 year ago. He has no history of diabetes. Associated symptoms include shortness of breath. Pertinent negatives include no chest pain or myalgias. Current antihyperlipidemic treatment includes statins.    Lab Results  Component Value Date   NA 138 11/28/2022   K 4.5 11/28/2022   CO2 23 11/28/2022   GLUCOSE 89 11/28/2022   BUN 24 11/28/2022   CREATININE 1.31 (H) 11/28/2022   CALCIUM 9.6 11/28/2022   EGFR 58 (L) 11/28/2022   GFRNONAA >60 11/22/2022   Lab Results  Component Value Date   CHOL 115 11/20/2022   HDL 27 (L) 11/20/2022   LDLCALC 79 11/20/2022   TRIG 45 11/20/2022   CHOLHDL 4.3 11/20/2022   Lab Results  Component Value Date   TSH 1.745 11/27/2020   Lab Results  Component Value Date   HGBA1C 5.5 11/19/2022   Lab Results  Component Value Date   WBC 6.0 11/28/2022   HGB 14.0 11/28/2022   HCT 42.5 11/28/2022   MCV 93 11/28/2022    PLT 143 (L) 11/28/2022   Lab Results  Component Value Date   ALT 42 11/19/2022   AST 39 11/19/2022   ALKPHOS 80 11/19/2022   BILITOT 2.4 (H) 11/19/2022   No results found for: "25OHVITD2", "25OHVITD3", "VD25OH"   Review of Systems  Constitutional:  Negative for unexpected weight change.  HENT:  Negative for trouble swallowing.   Respiratory:  Positive for shortness of breath. Negative for cough, chest tightness and wheezing.   Cardiovascular:  Negative for chest pain, palpitations, orthopnea and PND.  Gastrointestinal:  Negative for abdominal pain.  Genitourinary:  Negative for difficulty urinating.  Musculoskeletal:  Negative for myalgias.    Patient Active Problem List   Diagnosis Date Noted   Acute exacerbation of CHF (congestive heart failure) 11/19/2022   Anasarca 11/18/2022   Pulmonary edema 11/18/2022   Lower urinary tract symptoms (LUTS) 11/18/2022   Acidosis 11/18/2022   LFT elevation 11/18/2022   Troponin I above reference range 11/18/2022   Paronychia of left index finger 06/13/2022   Dilated cardiomyopathy 11/30/2020   Atrial tachycardia 11/30/2020   Frequent PVCs 11/30/2020   Troponin level elevated 11/29/2020   Benign essential HTN 11/29/2020   HLD (hyperlipidemia) 11/29/2020   Hypomagnesemia 11/29/2020   Amputation of right arm 11/29/2020   Acute combined systolic and diastolic congestive heart failure 11/29/2020   NSVT (nonsustained ventricular tachycardia) 11/29/2020   Chest pain 11/27/2020    No Known Allergies  Past Surgical History:  Procedure Laterality Date   ARM AMPUTATION AT ELBOW Right    KNEE  SURGERY      Social History   Tobacco Use   Smoking status: Former    Types: Cigarettes    Quit date: 10/09/2017    Years since quitting: 5.1   Smokeless tobacco: Never  Vaping Use   Vaping Use: Never used  Substance Use Topics   Alcohol use: No    Comment: quit 8 months ago   Drug use: No     Medication list has been reviewed and  updated.  Current Meds  Medication Sig   aspirin EC 81 MG tablet Take 1 tablet (81 mg total) by mouth daily. Swallow whole.   atorvastatin (LIPITOR) 40 MG tablet Take 1 tablet (40 mg total) by mouth daily. TAKE 1 TABLET(40 MG) BY MOUTH DAILY Strength: 40 mg   dapagliflozin propanediol (FARXIGA) 10 MG TABS tablet Take 1 tablet (10 mg total) by mouth daily.   metoprolol succinate (TOPROL-XL) 25 MG 24 hr tablet Take 1 tablet (25 mg total) by mouth daily.   potassium chloride 20 MEQ TBCR Take 0.5 tablets (10 mEq total) by mouth daily.   sacubitril-valsartan (ENTRESTO) 24-26 MG Take 1 tablet by mouth 2 (two) times daily.   spironolactone (ALDACTONE) 25 MG tablet Take 1 tablet (25 mg total) by mouth daily.   tamsulosin (FLOMAX) 0.4 MG CAPS capsule Take 1 capsule (0.4 mg total) by mouth daily.   torsemide (DEMADEX) 20 MG tablet Take 1 tablet (20 mg total) by mouth daily.       11/28/2022    2:50 PM 11/18/2022    2:53 PM 06/13/2022   10:59 AM 04/16/2022    1:11 PM  GAD 7 : Generalized Anxiety Score  Nervous, Anxious, on Edge 0 0 0 0  Control/stop worrying 0 0 0 0  Worry too much - different things 0 0 0 0  Trouble relaxing 0 0 0 0  Restless 0 0 0 0  Easily annoyed or irritable 0 0 0 0  Afraid - awful might happen 0 0 0 0  Total GAD 7 Score 0 0 0 0  Anxiety Difficulty Not difficult at all Not difficult at all Not difficult at all Not difficult at all       11/28/2022    2:49 PM 11/18/2022    2:53 PM 06/13/2022   10:58 AM  Depression screen PHQ 2/9  Decreased Interest 0 0 0  Down, Depressed, Hopeless 0 0 0  PHQ - 2 Score 0 0 0  Altered sleeping 0 0 0  Tired, decreased energy 0 0 0  Change in appetite 0 0 0  Feeling bad or failure about yourself  0 0 0  Trouble concentrating 0 0 0  Moving slowly or fidgety/restless 0 0 0  Suicidal thoughts 0 0 0  PHQ-9 Score 0 0 0  Difficult doing work/chores Not difficult at all Not difficult at all Not difficult at all    BP Readings from Last 3  Encounters:  12/19/22 90/70  11/28/22 128/62  11/22/22 114/70    Physical Exam Vitals and nursing note reviewed.  HENT:     Head: Normocephalic.     Right Ear: Tympanic membrane and external ear normal.     Left Ear: External ear normal.     Nose: Nose normal.     Mouth/Throat:     Mouth: Mucous membranes are moist.  Eyes:     General: No scleral icterus.       Right eye: No discharge.  Left eye: No discharge.     Conjunctiva/sclera: Conjunctivae normal.     Pupils: Pupils are equal, round, and reactive to light.  Neck:     Thyroid: No thyromegaly.     Vascular: No JVD.     Trachea: No tracheal deviation.  Cardiovascular:     Rate and Rhythm: Normal rate and regular rhythm.     Chest Wall: PMI is not displaced.     Pulses: Normal pulses.     Heart sounds: Normal heart sounds, S1 normal and S2 normal. No murmur heard.    No systolic murmur is present.     No diastolic murmur is present.     No friction rub. No gallop. No S3 or S4 sounds.  Pulmonary:     Effort: No respiratory distress.     Breath sounds: Normal breath sounds. No wheezing or rales.  Abdominal:     General: Bowel sounds are normal.     Palpations: Abdomen is soft. There is no mass.     Tenderness: There is no abdominal tenderness. There is no guarding or rebound.  Musculoskeletal:        General: No tenderness. Normal range of motion.     Cervical back: Normal range of motion and neck supple.     Right lower leg: No edema.     Left lower leg: No edema.  Lymphadenopathy:     Cervical: No cervical adenopathy.  Skin:    General: Skin is warm.     Findings: No rash.  Neurological:     Mental Status: He is alert and oriented to person, place, and time.     Cranial Nerves: No cranial nerve deficit.     Deep Tendon Reflexes: Reflexes are normal and symmetric.     Wt Readings from Last 3 Encounters:  12/19/22 211 lb (95.7 kg)  11/28/22 220 lb (99.8 kg)  11/21/22 234 lb 5.6 oz (106.3 kg)    BP  90/70   Pulse (!) 52   Ht 6\' 3"  (1.905 m)   Wt 211 lb (95.7 kg)   SpO2 99%   BMI 26.37 kg/m   Assessment and Plan: 1. Dilated cardiomyopathy Chronic.  Relatively controlled.  Relatively stable.  Patient apparently is unable to tolerate [place].  It seems to affect his appetite and his general wellbeing and on the days that he stopped that he was feeling better.  I do not think you will continue to take this even if prescribed him at this point in time we will simplify medications and we will return to lisinopril rather than Entresto 10 mg once a day metoprolol XL 25 mg will be continued once a day and spironolactone will be continued as well.  Perhaps with the ability of diuresis the beta-blocker and the ACE inhibitor for preload and afterload reduction we can have some level of control that the patient can tolerate.  Patient is down about 50 pounds from where he was a month ago due to aggressive diuresis but I am not how much we can maintain at this rate given the patient is lack of understanding of medication necessity. - lisinopril (ZESTRIL) 10 MG tablet; Take 1 tablet (10 mg total) by mouth daily.  Dispense: 90 tablet; Refill: 3 - metoprolol succinate (TOPROL-XL) 25 MG 24 hr tablet; Take 1 tablet (25 mg total) by mouth daily.  Dispense: 30 tablet; Refill: 0 - spironolactone (ALDACTONE) 25 MG tablet; Take 1 tablet (25 mg total) by mouth daily.  Dispense: 30 tablet; Refill: 0  2. Benign essential HTN Chronic.  Controlled.  Blood pressure is 90/70 and this may be contributing to the general decrease in wellbeing of the patient's perception.  As noted above we will hold the [place] and that patient is unable to get to his cardiology appointments but would have her excuse. - lisinopril (ZESTRIL) 10 MG tablet; Take 1 tablet (10 mg total) by mouth daily.  Dispense: 90 tablet; Refill: 3 - metoprolol succinate (TOPROL-XL) 25 MG 24 hr tablet; Take 1 tablet (25 mg total) by mouth  daily.  Dispense: 30 tablet; Refill: 0 - spironolactone (ALDACTONE) 25 MG tablet; Take 1 tablet (25 mg total) by mouth daily.  Dispense: 30 tablet; Refill: 0  3. Familial hypercholesterolemia Controlled.  Stable.  Continue atorvastatin 40 mg once a day. - atorvastatin (LIPITOR) 40 MG tablet; Take 1 tablet (40 mg total) by mouth daily. TAKE 1 TABLET(40 MG) BY MOUTH DAILY Strength: 40 mg  Dispense: 90 tablet; Refill: 0  4. Noncompliance Patient is missed or no showed or canceled all of his cardiology appointments and is on many occasions canceled by appointments as well.  I explained to him the importance of follow-up and I do not know if he is going to be able to understand that he needs to travel to [facility] to go to see cardiology at this time.  I think he has compromised I simplify his medications and see how he tolerates have him come back in 4 weeks recheck blood pressure and recheck weight and disposition.    Otilio Miu, MD

## 2022-12-20 ENCOUNTER — Telehealth: Payer: Self-pay | Admitting: Family Medicine

## 2022-12-20 NOTE — Telephone Encounter (Signed)
[facility] pharmacy, staff states that for the Rx Lisinopril it is ready for pick up and Atorvastatin is 10 days too soon for insurance to cover. No mention of medication be harmful to patient.  Called patient, he states that he was able to pick up the Lisinopril but Atorvastatin they held back and he thought that they could not give it to him because it was harmful, patient states he misunderstood. Advise patient of pharmacy recommendations of medication is 10 days to early to refill if insurance is to cover it. Patient verbal understanding.

## 2022-12-20 NOTE — Telephone Encounter (Signed)
Pt is calling to report that the pharmacy would not give him lisinopril (ZESTRIL) 10 MG tablet [[id number]] & atorvastatin (LIPITOR) 40 MG tablet [[id number]] because medication is harmful. Please advise with  [phone]

## 2023-01-23 ENCOUNTER — Ambulatory Visit (INDEPENDENT_AMBULATORY_CARE_PROVIDER_SITE_OTHER): Payer: 59 | Admitting: Family Medicine

## 2023-01-23 ENCOUNTER — Encounter: Payer: Self-pay | Admitting: Family Medicine

## 2023-01-23 VITALS — BP 128/76 | HR 71 | Ht 75.0 in | Wt 229.0 lb

## 2023-01-23 DIAGNOSIS — E7801 Familial hypercholesterolemia: Secondary | ICD-10-CM

## 2023-01-23 DIAGNOSIS — I10 Essential (primary) hypertension: Secondary | ICD-10-CM

## 2023-01-23 DIAGNOSIS — I420 Dilated cardiomyopathy: Secondary | ICD-10-CM

## 2023-01-23 MED ORDER — ATORVASTATIN CALCIUM 40 MG PO TABS: 40.0000 mg | ORAL_TABLET | Freq: Every day | ORAL | 1 refills | Status: DC

## 2023-01-23 MED ORDER — FUROSEMIDE 20 MG PO TABS: 20.0000 mg | ORAL_TABLET | Freq: Every day | ORAL | 3 refills | Status: DC

## 2023-01-23 MED ORDER — METOPROLOL SUCCINATE ER 25 MG PO TB24: 25.0000 mg | ORAL_TABLET | Freq: Every day | ORAL | 1 refills | Status: DC

## 2023-01-23 MED ORDER — SPIRONOLACTONE 25 MG PO TABS: 25.0000 mg | ORAL_TABLET | Freq: Every day | ORAL | 1 refills | Status: DC

## 2023-01-23 NOTE — Progress Notes (Signed)
Date:  01/23/2023   Name:  Scott Meyer   DOB:  [birth year]   MRN:  [id number]   Chief Complaint: Hypertension (Pt was given lisinopril to add to regimen. He states "I went to pharmacy and they said not to take it cause it would kill me")  Hypertension This is a chronic problem. The current episode started more than 1 year ago. The problem has been gradually improving since onset. The problem is controlled. Pertinent negatives include no blurred vision, chest pain, headaches, orthopnea, palpitations, PND or shortness of breath. Risk factors for coronary artery disease include dyslipidemia. Past treatments include beta blockers, ACE inhibitors and diuretics.    Lab Results  Component Value Date   NA 138 11/28/2022   K 4.5 11/28/2022   CO2 23 11/28/2022   GLUCOSE 89 11/28/2022   BUN 24 11/28/2022   CREATININE 1.31 (H) 11/28/2022   CALCIUM 9.6 11/28/2022   EGFR 58 (L) 11/28/2022   GFRNONAA >60 11/22/2022   Lab Results  Component Value Date   CHOL 115 11/20/2022   HDL 27 (L) 11/20/2022   LDLCALC 79 11/20/2022   TRIG 45 11/20/2022   CHOLHDL 4.3 11/20/2022   Lab Results  Component Value Date   TSH 1.745 11/27/2020   Lab Results  Component Value Date   HGBA1C 5.5 11/19/2022   Lab Results  Component Value Date   WBC 6.0 11/28/2022   HGB 14.0 11/28/2022   HCT 42.5 11/28/2022   MCV 93 11/28/2022   PLT 143 (L) 11/28/2022   Lab Results  Component Value Date   ALT 42 11/19/2022   AST 39 11/19/2022   ALKPHOS 80 11/19/2022   BILITOT 2.4 (H) 11/19/2022   No results found for: "25OHVITD2", "25OHVITD3", "VD25OH"   Review of Systems  Eyes:  Negative for blurred vision and visual disturbance.  Respiratory:  Negative for cough, chest tightness, shortness of breath and wheezing.   Cardiovascular:  Positive for leg swelling. Negative for chest pain, palpitations, orthopnea and PND.       Left ankle and foot  Gastrointestinal:  Negative for abdominal pain.  Neurological:   Negative for headaches.    Patient Active Problem List   Diagnosis Date Noted   Acute exacerbation of CHF (congestive heart failure) (HCC) 11/19/2022   Anasarca 11/18/2022   Pulmonary edema 11/18/2022   Lower urinary tract symptoms (LUTS) 11/18/2022   Acidosis 11/18/2022   LFT elevation 11/18/2022   Troponin I above reference range 11/18/2022   Paronychia of left index finger 06/13/2022   Dilated cardiomyopathy (HCC) 11/30/2020   Atrial tachycardia 11/30/2020   Frequent PVCs 11/30/2020   Troponin level elevated 11/29/2020   Benign essential HTN 11/29/2020   HLD (hyperlipidemia) 11/29/2020   Hypomagnesemia 11/29/2020   Amputation of right arm (HCC) 11/29/2020   Acute combined systolic and diastolic congestive heart failure (HCC) 11/29/2020   NSVT (nonsustained ventricular tachycardia) (HCC) 11/29/2020   Chest pain 11/27/2020    No Known Allergies  Past Surgical History:  Procedure Laterality Date   ARM AMPUTATION AT ELBOW Right    KNEE SURGERY      Social History   Tobacco Use   Smoking status: Former    Types: Cigarettes    Quit date: 10/09/2017    Years since quitting: 5.2   Smokeless tobacco: Never  Vaping Use   Vaping Use: Never used  Substance Use Topics   Alcohol use: No    Comment: quit 8 months ago   Drug use: No  Medication list has been reviewed and updated.  Current Meds  Medication Sig   aspirin EC 81 MG tablet Take 1 tablet (81 mg total) by mouth daily. Swallow whole.   atorvastatin (LIPITOR) 40 MG tablet Take 1 tablet (40 mg total) by mouth daily. TAKE 1 TABLET(40 MG) BY MOUTH DAILY Strength: 40 mg   metoprolol succinate (TOPROL-XL) 25 MG 24 hr tablet Take 1 tablet (25 mg total) by mouth daily.   spironolactone (ALDACTONE) 25 MG tablet Take 1 tablet (25 mg total) by mouth daily.       01/23/2023   10:29 AM 11/28/2022    2:50 PM 11/18/2022    2:53 PM 06/13/2022   10:59 AM  GAD 7 : Generalized Anxiety Score  Nervous, Anxious, on Edge 0 0 0 0   Control/stop worrying 0 0 0 0  Worry too much - different things 0 0 0 0  Trouble relaxing 0 0 0 0  Restless 0 0 0 0  Easily annoyed or irritable 0 0 0 0  Afraid - awful might happen 0 0 0 0  Total GAD 7 Score 0 0 0 0  Anxiety Difficulty Not difficult at all Not difficult at all Not difficult at all Not difficult at all       01/23/2023   10:29 AM 11/28/2022    2:49 PM 11/18/2022    2:53 PM  Depression screen PHQ 2/9  Decreased Interest 0 0 0  Down, Depressed, Hopeless 0 0 0  PHQ - 2 Score 0 0 0  Altered sleeping 0 0 0  Tired, decreased energy 0 0 0  Change in appetite 0 0 0  Feeling bad or failure about yourself  0 0 0  Trouble concentrating 0 0 0  Moving slowly or fidgety/restless 0 0 0  Suicidal thoughts 0 0 0  PHQ-9 Score 0 0 0  Difficult doing work/chores Not difficult at all Not difficult at all Not difficult at all    BP Readings from Last 3 Encounters:  01/23/23 128/76  12/19/22 90/70  11/28/22 128/62    Physical Exam Vitals and nursing note reviewed.  HENT:     Head: Normocephalic.     Right Ear: Tympanic membrane normal.     Nose: Nose normal.     Mouth/Throat:     Mouth: Mucous membranes are moist.     Pharynx: No oropharyngeal exudate or posterior oropharyngeal erythema.  Eyes:     Pupils: Pupils are equal, round, and reactive to light.  Cardiovascular:     Rate and Rhythm: Normal rate.     Pulses: Normal pulses.     Heart sounds: Normal heart sounds. No murmur heard.    No gallop.  Pulmonary:     Breath sounds: No wheezing, rhonchi or rales.  Abdominal:     Tenderness: There is no abdominal tenderness.  Neurological:     Mental Status: He is alert.     Wt Readings from Last 3 Encounters:  01/23/23 229 lb (103.9 kg)  12/19/22 211 lb (95.7 kg)  11/28/22 220 lb (99.8 kg)    BP 128/76   Pulse 71   Ht 6\' 3"  (1.905 m)   Wt 229 lb (103.9 kg)   SpO2 98%   BMI 28.62 kg/m   Assessment and Plan: 1. Essential hypertension Chronic.   Controlled.  Stable.  Blood pressure 128/76.  Asymptomatic.  Tolerating medications well.  Patient has not taken his lisinopril because he was probably given information of spironolactone  and lisinopril and got the impression that the combination will kill him and he did not take his lisinopril.  Lisinopril was substituted for the [facility] that he had last visit that we discontinued because patient had discontinued on his own and absolutely refused to take it because he could not tolerate it.  In the nonending saga of [employer] given that the blood pressure is fine and it does not appear to be acutely short of breath or having dyspnea on exertion we will continue to hold the lisinopril and just maintain him on metoprolol spironolactone however given the concern below with added Lasix. - metoprolol succinate (TOPROL-XL) 25 MG 24 hr tablet; Take 1 tablet (25 mg total) by mouth daily.  Dispense: 90 tablet; Refill: 1 - spironolactone (ALDACTONE) 25 MG tablet; Take 1 tablet (25 mg total) by mouth daily.  Dispense: 90 tablet; Refill: 1  2. Benign essential HTN As noted above  3. Dilated cardiomyopathy (HCC) Chronic.  Persistent.  Stable.  Patient has swelling of his right ankle which he attributed to a pork chop.  Patient actually has had an 18 pound weight gain since April 4.  This is likely due to questionable dietary choices and patient's been reemphasized to avoid sodium laden foods in the meantime we will add Lasix 20 mg to his spironolactone in hopes to balance the potassium sparing NSAIDs potassium wasting aspect in order to lose more fluid.  Patient has been given a appointment for congestive heart failure clinic for May 22 and and we will call him with this appointment and strongly emphasized that he must go to this appointment. - metoprolol succinate (TOPROL-XL) 25 MG 24 hr tablet; Take 1 tablet (25 mg total) by mouth daily.  Dispense: 90 tablet; Refill: 1 - spironolactone (ALDACTONE) 25 MG  tablet; Take 1 tablet (25 mg total) by mouth daily.  Dispense: 90 tablet; Refill: 1 - furosemide (LASIX) 20 MG tablet; Take 1 tablet (20 mg total) by mouth daily.  Dispense: 30 tablet; Refill: 3  4. Familial hypercholesterolemia Chronic.  Controlled.  Stable.  Continue atorvastatin 40 mg daily. - atorvastatin (LIPITOR) 40 MG tablet; Take 1 tablet (40 mg total) by mouth daily. TAKE 1 TABLET(40 MG) BY MOUTH DAILY Strength: 40 mg  Dispense: 90 tablet; Refill: 1     Elizabeth Sauer, MD

## 2023-01-29 LAB — FECAL OCCULT BLOOD, IMMUNOCHEMICAL: IFOBT: NEGATIVE

## 2023-02-05 ENCOUNTER — Encounter: Payer: Self-pay | Admitting: Family

## 2023-02-05 ENCOUNTER — Telehealth: Payer: Self-pay

## 2023-02-05 ENCOUNTER — Ambulatory Visit: Payer: 59 | Attending: Family | Admitting: Family

## 2023-02-05 VITALS — BP 142/92 | HR 75 | Wt 230.2 lb

## 2023-02-05 DIAGNOSIS — M25473 Effusion, unspecified ankle: Secondary | ICD-10-CM | POA: Insufficient documentation

## 2023-02-05 DIAGNOSIS — Z833 Family history of diabetes mellitus: Secondary | ICD-10-CM | POA: Insufficient documentation

## 2023-02-05 DIAGNOSIS — I10 Essential (primary) hypertension: Secondary | ICD-10-CM

## 2023-02-05 DIAGNOSIS — R079 Chest pain, unspecified: Secondary | ICD-10-CM | POA: Diagnosis not present

## 2023-02-05 DIAGNOSIS — Z720 Tobacco use: Secondary | ICD-10-CM

## 2023-02-05 DIAGNOSIS — I428 Other cardiomyopathies: Secondary | ICD-10-CM | POA: Diagnosis not present

## 2023-02-05 DIAGNOSIS — Z79899 Other long term (current) drug therapy: Secondary | ICD-10-CM | POA: Diagnosis not present

## 2023-02-05 DIAGNOSIS — Z87891 Personal history of nicotine dependence: Secondary | ICD-10-CM | POA: Diagnosis not present

## 2023-02-05 DIAGNOSIS — I110 Hypertensive heart disease with heart failure: Secondary | ICD-10-CM | POA: Diagnosis not present

## 2023-02-05 DIAGNOSIS — I5022 Chronic systolic (congestive) heart failure: Secondary | ICD-10-CM | POA: Insufficient documentation

## 2023-02-05 MED ORDER — TORSEMIDE 20 MG PO TABS: 20.0000 mg | ORAL_TABLET | Freq: Every day | ORAL | 5 refills | Status: DC

## 2023-02-05 NOTE — Patient Instructions (Signed)
STOP taking furosemide   Start taking torsemide as 1 tablet every day to help get the fluid off of you.

## 2023-02-05 NOTE — Progress Notes (Signed)
[facility] - Pharmacist Note  Scott Meyer is a 72 y.o. male with HFrEF (EF <40%) presenting to the Heart Failure Clinic for a follow-up visit. Patient reports having no specific organizational system to remind them to take their medication but states they are adherent with no problems. Patient does not have any cost issues or problems obtaining their medications. The patient checks their weight regularly at home, either daily or every other day. The patient reports that their weight fluctuates and could be due to fluid retention. The patient wants to remain under 235 pounds. The patient has noticed significant leg swelling since about 2 weeks ago. Patient complained of chest pain and SOB upon arrival but seems stable after speaking with them. Patient has SOB off and on. Patient reported changes in their appetite with feeling less hungry and feeling satiated quickly. Patient drinks a few cups of water a day but prefers drinks like Sunkist or Ginger Ale which he purchases 2 L bottles at a time that last a few days. Patient complained of furosemide causing stomach pain and will sometimes skip a dose.   Recent ED Visit (past 6 months):  Date: 11/18/2022, CC: Anasarca   Guideline-Directed Medical Therapy/Evidence Based Medicine ACE/ARB/ARNI:  none Beta Blocker: Metoprolol succinate 25 mg daily Aldosterone Antagonist: Spironolactone 25 mg daily Diuretic: Furosemide 20 mg daily SGLT2i:  none  Adherence Assessment Do you ever forget to take your medication? [] Yes [x] No  Do you ever skip doses due to side effects? [x] Yes [] No  Do you have trouble affording your medicines? [] Yes [x] No  Are you ever unable to pick up your medication due to transportation difficulties? [] Yes [x] No  Do you ever stop taking your medications because you don't believe they are helping? [] Yes [x] No  Do you check your weight daily? [x] Yes [] No  Adherence strategy: None reported Barriers to obtaining  medications: None reported  Diagnostics ECHO: Date 11/19/2022, EF 20-25%, GHK, MLVH, Grade 2 Diastolic Dysfunction   Vitals    02/05/2023    1:56 PM 01/23/2023   10:23 AM 12/19/2022    9:58 AM  Vitals with BMI  Height  6\' 3"  6\' 3"   Weight 230 lbs 3 oz 229 lbs 211 lbs  BMI 28.77 28.62 26.37  Systolic 142 128 90  Diastolic 92 76 70  Pulse 75 71 52     Recent Labs    Latest Ref Rng & Units 11/28/2022    3:44 PM 11/22/2022    5:19 AM 11/21/2022    4:48 AM  BMP  Glucose 70 - 99 mg/dL 89  96  79   BUN 8 - 27 mg/dL 24  19  20    Creatinine 0.76 - 1.27 mg/dL 1.61  0.96  0.45   BUN/Creat Ratio 10 - 24 18     Sodium 134 - 144 mmol/L 138  134  138   Potassium 3.5 - 5.2 mmol/L 4.5  3.5  3.9   Chloride 96 - 106 mmol/L 101  102  106   CO2 20 - 29 mmol/L 23  25  23    Calcium 8.6 - 10.2 mg/dL 9.6  8.6  8.7     Past Medical History Past Medical History:  Diagnosis Date   Cardiomyopathy (HCC)    a. 11/2020 Echo: EF 25-30%, mod LVH, Gr1 DD. Mild AoV Ca2+ w/ mild to mod AoV sclerosis.   CHF (congestive heart failure) (HCC)    Hypertension     Plan Continue regimen as directed by NP  Recommend adherence to fluid restriction  Annual echo due 11/2023 ReDS: 41% Switch furosemide to torsemide 20 mg once daily and check labs next visit   Time spent: 15 minutes  Sharion Dove, PharmD Candidate Student Pharmacist 02/05/2023 2:35 PM

## 2023-02-05 NOTE — Progress Notes (Unsigned)
PCP: Elizabeth Sauer, MD (last seen 05/24) Primary Cardiologist:  HPI:  Scott Meyer is a 72 y/o male with a history of HTN, previous tobacco use and chronic heart failure.   Echo 11/19/22: EF 20-25% along with LVH along with Grade II DD and mild/moderate Scott. Echo 11/28/20: EF of 25-30% along with moderate LVH.   Admitted 11/18/22 due to SOB and increased pedal edema due to a/c HF exacerbation. Initially given IV lasix with transition to oral diuretics.   He presents today for a follow-up visit with a chief complaint of SOB intermittent, slight chest pain, ankle swelling.  Ate 2 some pork chops about 2 weeks ago.   [place], sunkist 2L of each every few days.        ROS: All systems negative except as listed in HPI, PMH and Problem List.  SH:  Social History   Socioeconomic History   Marital status: Single    Spouse name: Not on file   Number of children: Not on file   Years of education: Not on file   Highest education level: Not on file  Occupational History   Occupation: Retired  Tobacco Use   Smoking status: Former    Types: Cigarettes    Quit date: 10/09/2017    Years since quitting: 5.3   Smokeless tobacco: Never  Vaping Use   Vaping Use: Never used  Substance and Sexual Activity   Alcohol use: No    Comment: quit 8 months ago   Drug use: No   Sexual activity: Not on file  Other Topics Concern   Not on file  Social History Narrative   Lives in [place] w/ his sister.  Does not routinely exercise.   Social Determinants of Health   Financial Resource Strain: Low Risk  (05/24/2022)   Overall Financial Resource Strain (CARDIA)    Difficulty of Paying Living Expenses: Not hard at all  Food Insecurity: No Food Insecurity (05/24/2022)   Hunger Vital Sign    Worried About Running Out of Food in the Last Year: Never true    Ran Out of Food in the Last Year: Never true  Transportation Needs: No Transportation Needs (05/24/2022)   PRAPARE - Scientist, research (physical sciences) (Medical): No    Lack of Transportation (Non-Medical): No  Physical Activity: Inactive (05/24/2022)   Exercise Vital Sign    Days of Exercise per Week: 0 days    Minutes of Exercise per Session: 0 min  Stress: No Stress Concern Present (05/24/2022)   [employer] of Occupational Health - Occupational Stress Questionnaire    Feeling of Stress : Only a little  Social Connections: Socially Isolated (05/24/2022)   Social Connection and Isolation Panel [NHANES]    Frequency of Communication with Friends and Family: Never    Frequency of Social Gatherings with Friends and Family: Never    Attends Religious Services: Never    Database administrator or Organizations: No    Attends Banker Meetings: Never    Marital Status: Never married  Intimate Partner Violence: Not At Risk (05/24/2022)   Humiliation, Afraid, Rape, and Kick questionnaire    Fear of Current or Ex-Partner: No    Emotionally Abused: No    Physically Abused: No    Sexually Abused: No    FH:  Family History  Problem Relation Age of Onset   Diabetes Mother    Cirrhosis Father    Alcohol abuse Father    Other Brother  gsw   Cancer Brother        'head full of tumors'   Other Brother        'killed w/ 2x4 to the head'   Other Sister        mva    Past Medical History:  Diagnosis Date   Cardiomyopathy (HCC)    a. 11/2020 Echo: EF 25-30%, mod LVH, Gr1 DD. Mild AoV Ca2+ w/ mild to mod AoV sclerosis.   CHF (congestive heart failure) (HCC)    Hypertension     Current Outpatient Medications  Medication Sig Dispense Refill   aspirin EC 81 MG tablet Take 1 tablet (81 mg total) by mouth daily. Swallow whole. 90 tablet 0   atorvastatin (LIPITOR) 40 MG tablet Take 1 tablet (40 mg total) by mouth daily. TAKE 1 TABLET(40 MG) BY MOUTH DAILY Strength: 40 mg 90 tablet 1   furosemide (LASIX) 20 MG tablet Take 1 tablet (20 mg total) by mouth daily. 30 tablet 3   metoprolol succinate (TOPROL-XL) 25  MG 24 hr tablet Take 1 tablet (25 mg total) by mouth daily. 90 tablet 1   spironolactone (ALDACTONE) 25 MG tablet Take 1 tablet (25 mg total) by mouth daily. 90 tablet 1   No current facility-administered medications for this visit.   Vitals:   02/05/23 1356  BP: (!) 142/92  Pulse: 75  SpO2: 99%  Weight: 230 lb 3.2 oz (104.4 kg)   Wt Readings from Last 3 Encounters:  02/05/23 230 lb 3.2 oz (104.4 kg)  01/23/23 229 lb (103.9 kg)  12/19/22 211 lb (95.7 kg)   Lab Results  Component Value Date   CREATININE 1.31 (H) 11/28/2022   CREATININE 1.14 11/22/2022   CREATININE 1.14 11/21/2022   PHYSICAL EXAM:  General:  Well appearing. No resp difficulty HEENT: normal Neck: supple. JVP flat. Carotids 2+ bilaterally; no bruits. No lymphadenopathy or thryomegaly appreciated. Cor: PMI normal. Regular rate & rhythm. No rubs, gallops or murmurs. Lungs: clear Abdomen: soft, nontender, nondistended. No hepatosplenomegaly. No bruits or masses. Good bowel sounds. Extremities: no cyanosis, clubbing, rash, edema Neuro: alert & orientedx3, cranial nerves grossly intact. Moves all 4 extremities w/o difficulty. Affect pleasant.  ReDs:   ECG:   ASSESSMENT & PLAN:   1: Chronic heart failure with reduced ejection fraction- - NYHA class II - euvolemic today - says that he's weighing daily; reminded to call for an overnight weight gain of > 2 pounds or a weekly weight gain of > 5 pounds - weight stable from last visit here 9 months ago - Echo 11/19/22: EF 20-25% along with LVH along with Grade II DD and mild/moderate Scott.  - Echo 11/28/20: EF of 25-30% along with moderate LVH.  - not adding salt but "just a pinch" when eating potatoes, eggs or cube steak. Reinforced the importance of not adding salt to his food and reading food labels for sodium content although he says that he can't read very well - already rinses canned vegetables - continue losartan - BNP 11/18/22 was 1074.7 - PharmD reconciled meds  w/ patient  2: HTN- - BP 142/92 - saw PCP Yetta Barre) 05/24 - BMP 11/28/22 reviewed and showed sodium 138, potassium 4.5, creatinine 1.31 and GFR 58  3: Tobacco use- - says that he hasn't smoked any in ~ 1 year - no alcohol since longer than that

## 2023-02-05 NOTE — Telephone Encounter (Signed)
Called pt yesterday and this am and reminded him of his appt with Clarisa Kindred at 230 today

## 2023-02-06 ENCOUNTER — Encounter: Payer: Self-pay | Admitting: Family

## 2023-02-11 ENCOUNTER — Other Ambulatory Visit: Payer: Self-pay

## 2023-02-11 DIAGNOSIS — I5022 Chronic systolic (congestive) heart failure: Secondary | ICD-10-CM

## 2023-02-11 MED ORDER — TORSEMIDE 20 MG PO TABS: 20.0000 mg | ORAL_TABLET | Freq: Every day | ORAL | 5 refills | Status: DC

## 2023-02-11 NOTE — Telephone Encounter (Signed)
[facility] sent fax requesting Torsemide refill for patient. Medication was refilled.

## 2023-02-12 ENCOUNTER — Telehealth: Payer: Self-pay

## 2023-02-12 NOTE — Telephone Encounter (Signed)
Called and left vm on her phone concerning a fax we received from [facility]- need to know if pt is switching to mail order pharm

## 2023-02-13 ENCOUNTER — Other Ambulatory Visit
Admission: RE | Admit: 2023-02-13 | Discharge: 2023-02-13 | Disposition: A | Discharge status: Home / Self Care | Payer: 59 | Source: Ambulatory Visit | Attending: Family | Admitting: Family

## 2023-02-13 ENCOUNTER — Encounter: Payer: Self-pay | Admitting: Family

## 2023-02-13 ENCOUNTER — Ambulatory Visit (HOSPITAL_BASED_OUTPATIENT_CLINIC_OR_DEPARTMENT_OTHER): Payer: 59 | Admitting: Family

## 2023-02-13 VITALS — BP 123/65 | HR 73 | Wt 220.0 lb

## 2023-02-13 DIAGNOSIS — I5022 Chronic systolic (congestive) heart failure: Secondary | ICD-10-CM

## 2023-02-13 DIAGNOSIS — Z720 Tobacco use: Secondary | ICD-10-CM

## 2023-02-13 DIAGNOSIS — I10 Essential (primary) hypertension: Secondary | ICD-10-CM

## 2023-02-13 LAB — BASIC METABOLIC PANEL
Anion gap: 7 (ref 5–15)
BUN: 22 mg/dL (ref 8–23)
CO2: 24 mmol/L (ref 22–32)
Calcium: 9.1 mg/dL (ref 8.9–10.3)
Chloride: 108 mmol/L (ref 98–111)
Creatinine, Ser: 1.1 mg/dL (ref 0.61–1.24)
GFR, Estimated: >60 mL/min (ref >60)
Glucose, Bld: 85 mg/dL (ref 70–99)
Potassium: 3.7 mmol/L (ref 3.5–5.1)
Sodium: 139 mmol/L (ref 135–145)

## 2023-02-13 NOTE — Progress Notes (Signed)
[facility] - Pharmacist Note  Scott Meyer is a 72 y.o. male with HFrEF (EF <40%) presenting to the Heart Failure Clinic for follow up. Since last visit, patient reports that his leg swelling is much improved. He was switched from furosemide to torsemide and reports no side effects since switching diuretics. He is down ~10 lbs since last visit. He states that he has tried to stay away from salty foods and reduce his soda intake over the past week. He states that the torsemide is working a little too well, endorsing frequent urination. He would like to reduce the dose if possible today.  Recent ED Visit (past 6 months):  Date: 11/18/2022, CC: [facility]   Guideline-Directed Medical Therapy/Evidence Based Medicine ACE/ARB/ARNI:  none Beta Blocker: Metoprolol succinate 25 mg daily Aldosterone Antagonist: Spironolactone 25 mg daily Diuretic: Torsemide 20 mg daily SGLT2i:  none  Adherence Assessment Do you ever forget to take your medication? [] Yes [x] No  Do you ever skip doses due to side effects? [] Yes [x] No  Do you have trouble affording your medicines? [] Yes [x] No  Are you ever unable to pick up your medication due to transportation difficulties? [] Yes [x] No  Do you ever stop taking your medications because you don't believe they are helping? [] Yes [x] No  Do you check your weight daily? [x] Yes [] No  Adherence strategy: none reported Barriers to obtaining medications: none reported   Diagnostics ECHO: Date 11/19/2022, EF 20-25%, GHK, mild LVH, G2DD  Vitals    02/13/2023    9:52 AM 02/05/2023    1:56 PM 01/23/2023   10:23 AM  Vitals with BMI  Height   6\' 3"   Weight 220 lbs 230 lbs 3 oz 229 lbs  BMI 27.5 28.77 28.62  Systolic 123 142 914  Diastolic 65 92 76  Pulse 73 75 71     Recent Labs    Latest Ref Rng & Units 11/28/2022    3:44 PM 11/22/2022    5:19 AM 11/21/2022    4:48 AM  BMP  Glucose 70 - 99 mg/dL 89  96  79   BUN 8 - 27 mg/dL 24  19  20    Creatinine  0.76 - 1.27 mg/dL 7.82  9.56  2.13   BUN/Creat Ratio 10 - 24 18     Sodium 134 - 144 mmol/L 138  134  138   Potassium 3.5 - 5.2 mmol/L 4.5  3.5  3.9   Chloride 96 - 106 mmol/L 101  102  106   CO2 20 - 29 mmol/L 23  25  23    Calcium 8.6 - 10.2 mg/dL 9.6  8.6  8.7     Past Medical History Past Medical History:  Diagnosis Date   Cardiomyopathy (HCC)    a. 11/2020 Echo: EF 25-30%, mod LVH, Gr1 DD. Mild AoV Ca2+ w/ mild to mod AoV sclerosis.   CHF (congestive heart failure) (HCC)    Hypertension     Plan Continue regimen as directed by NP Reduce torsemide to 10 mg daily Continue to monitor salt intake and adhere to fluid restriction Annual echo due 11/2023  Time spent: 10 minutes  Celene Squibb, PharmD PGY1 Pharmacy Resident 02/13/2023 10:28 AM

## 2023-02-13 NOTE — Patient Instructions (Addendum)
Decrease your torsemide to 1/2 tablet every day   Start baby aspirin 81mg  once daily

## 2023-02-13 NOTE — Progress Notes (Signed)
PCP: Elizabeth Sauer, MD (last seen 05/24) Primary Cardiologist: none  HPI:  Scott Meyer is a 72 y/o male with a history of HTN, previous tobacco use and chronic heart failure.   Echo 11/19/22: EF 20-25% along with LVH along with Grade II DD and mild/moderate Scott. Echo 11/28/20: EF of 25-30% along with moderate LVH.   Admitted 11/18/22 due to SOB and increased pedal edema due to a/c HF exacerbation. Initially given IV lasix with transition to oral diuretics.   He presents today for a follow-up visit with a chief complaint of minimal SOB with exertion. Chronic in nature although occurs intermittently and he feels like it has improved since changing his diuretic. Has associated edema (although improving) & intermittent dizziness along with this. Denies chest pain, palpitations, cough or difficulty sleeping.   Diuretic was changed to torsemide at last visit and he doesn't experience any abdominal pain like he did with the furosemide. He does report a "big increase" in his urinating and asks about changing the dose.   ROS: All systems negative except as listed in HPI, PMH and Problem List.  SH:  Social History   Socioeconomic History   Marital status: Single    Spouse name: Not on file   Number of children: Not on file   Years of education: Not on file   Highest education level: Not on file  Occupational History   Occupation: Retired  Tobacco Use   Smoking status: Former    Types: Cigarettes    Quit date: 10/09/2017    Years since quitting: 5.3   Smokeless tobacco: Never  Vaping Use   Vaping Use: Never used  Substance and Sexual Activity   Alcohol use: No    Comment: quit 8 months ago   Drug use: No   Sexual activity: Not on file  Other Topics Concern   Not on file  Social History Narrative   Lives in [place] w/ his sister.  Does not routinely exercise.   Social Determinants of Health   Financial Resource Strain: Low Risk  (05/24/2022)   Overall Financial Resource Strain (CARDIA)     Difficulty of Paying Living Expenses: Not hard at all  Food Insecurity: No Food Insecurity (05/24/2022)   Hunger Vital Sign    Worried About Running Out of Food in the Last Year: Never true    Ran Out of Food in the Last Year: Never true  Transportation Needs: No Transportation Needs (05/24/2022)   PRAPARE - Administrator, Civil Service (Medical): No    Lack of Transportation (Non-Medical): No  Physical Activity: Inactive (05/24/2022)   Exercise Vital Sign    Days of Exercise per Week: 0 days    Minutes of Exercise per Session: 0 min  Stress: No Stress Concern Present (05/24/2022)   [employer] of Occupational Health - Occupational Stress Questionnaire    Feeling of Stress : Only a little  Social Connections: Socially Isolated (05/24/2022)   Social Connection and Isolation Panel [NHANES]    Frequency of Communication with Friends and Family: Never    Frequency of Social Gatherings with Friends and Family: Never    Attends Religious Services: Never    Database administrator or Organizations: No    Attends Banker Meetings: Never    Marital Status: Never married  Intimate Partner Violence: Not At Risk (05/24/2022)   Humiliation, Afraid, Rape, and Kick questionnaire    Fear of Current or Ex-Partner: No    Emotionally Abused:  No    Physically Abused: No    Sexually Abused: No    FH:  Family History  Problem Relation Age of Onset   Diabetes Mother    Cirrhosis Father    Alcohol abuse Father    Other Brother        gsw   Cancer Brother        'head full of tumors'   Other Brother        'killed w/ 2x4 to the head'   Other Sister        mva    Past Medical History:  Diagnosis Date   Cardiomyopathy (HCC)    a. 11/2020 Echo: EF 25-30%, mod LVH, Gr1 DD. Mild AoV Ca2+ w/ mild to mod AoV sclerosis.   CHF (congestive heart failure) (HCC)    Hypertension     Current Outpatient Medications  Medication Sig Dispense Refill   acetaminophen (TYLENOL) 500 MG  tablet Take 500 mg by mouth every 6 (six) hours as needed for mild pain.     atorvastatin (LIPITOR) 40 MG tablet Take 1 tablet (40 mg total) by mouth daily. TAKE 1 TABLET(40 MG) BY MOUTH DAILY Strength: 40 mg 90 tablet 1   metoprolol succinate (TOPROL-XL) 25 MG 24 hr tablet Take 1 tablet (25 mg total) by mouth daily. 90 tablet 1   spironolactone (ALDACTONE) 25 MG tablet Take 1 tablet (25 mg total) by mouth daily. 90 tablet 1   torsemide (DEMADEX) 20 MG tablet Take 1 tablet (20 mg total) by mouth daily. 30 tablet 5   aspirin EC 81 MG tablet Take 1 tablet (81 mg total) by mouth daily. Swallow whole. (Patient not taking: Reported on 02/05/2023) 90 tablet 0   No current facility-administered medications for this visit.   Vitals:   02/13/23 0952  BP: 123/65  Pulse: 73  SpO2: 100%  Weight: 220 lb (99.8 kg)   Wt Readings from Last 3 Encounters:  02/13/23 220 lb (99.8 kg)  02/05/23 230 lb 3.2 oz (104.4 kg)  01/23/23 229 lb (103.9 kg)   Lab Results  Component Value Date   CREATININE 1.10 02/13/2023   CREATININE 1.31 (H) 11/28/2022   CREATININE 1.14 11/22/2022    PHYSICAL EXAM:  General:  Well appearing. No resp difficulty HEENT: normal Neck: supple. JVP flat. No lymphadenopathy or thryomegaly appreciated. Cor: PMI normal. Regular rate & rhythm. No rubs, gallops or murmurs. Lungs: clear Abdomen: soft, nontender, nondistended. No hepatosplenomegaly. No bruits or masses. Good bowel sounds. Extremities: no cyanosis, clubbing, rash, 1+ pitting edema right lower leg, trace pitting left lower leg Neuro: alert & oriented x3, cranial nerves grossly intact. Moves all 4 extremities w/o difficulty. Affect pleasant.   ECG: none   ASSESSMENT & PLAN:   1: NICM with reduced ejection fraction- - likely due to HTN - NYHA class II - euvolemic today - says that he's weighing daily; reminded to call for an overnight weight gain of > 2 pounds or a weekly weight gain of > 5 pounds - weight down 10  pounds from last visit here 1 week ago - Echo 11/19/22: EF 20-25% along with LVH along with Grade II DD and mild/moderate Scott.  - Echo 11/28/20: EF of 25-30% along with moderate LVH.  - not adding salt but "just a pinch"  - already rinses canned vegetables - GDMT is limited by patient's willingness to take more medications - continue metoprolol succinate 25mg  daily - continue spironolactone 25mg  daily - continue torsemide but decrease  this to 10mg  daily - BMP today - BNP 11/18/22 was 1074.7 - PharmD reconciled meds w/ patient  2: HTN- - BP 123/65 - saw PCP Scott Meyer) 05/24 - BMP 11/28/22 reviewed and showed sodium 138, potassium 4.5, creatinine 1.31 and GFR 58 - instructed to resume his aspirin 81mg  daily  3: Tobacco use- - says that he hasn't smoked any in ~ 1 year - no alcohol since longer than that  Return in 1 month, sooner if needed.

## 2023-02-21 ENCOUNTER — Other Ambulatory Visit: Payer: Self-pay | Admitting: Family Medicine

## 2023-02-21 DIAGNOSIS — E7801 Familial hypercholesterolemia: Secondary | ICD-10-CM

## 2023-02-21 DIAGNOSIS — I10 Essential (primary) hypertension: Secondary | ICD-10-CM

## 2023-02-21 DIAGNOSIS — I420 Dilated cardiomyopathy: Secondary | ICD-10-CM

## 2023-02-21 DIAGNOSIS — E78019 Familial hypercholesterolemia, unspecified: Secondary | ICD-10-CM

## 2023-02-21 NOTE — Telephone Encounter (Signed)
Unable to refill per protocol, Rx request is too soon. Last refill 01/23/23 for 90 and 1 refill.  Requested Prescriptions  Pending Prescriptions Disp Refills   atorvastatin (LIPITOR) 40 MG tablet 90 tablet 1    Sig: Take 1 tablet (40 mg total) by mouth daily. TAKE 1 TABLET(40 MG) BY MOUTH DAILY Strength: 40 mg     Cardiovascular:  Antilipid - Statins Failed - 02/21/2023 11:12 AM      Failed - Lipid Panel in normal range within the last 12 months    Cholesterol, Total  Date Value Ref Range Status  06/16/2019 205 (H) 100 - 199 mg/dL Final   Cholesterol  Date Value Ref Range Status  11/20/2022 115 0 - 200 mg/dL Final   LDL Chol Calc (NIH)  Date Value Ref Range Status  06/16/2019 148 (H) 0 - 99 mg/dL Final   LDL Cholesterol  Date Value Ref Range Status  11/20/2022 79 0 - 99 mg/dL Final    Comment:           Total Cholesterol/HDL:CHD Risk Coronary Heart Disease Risk Table                     Men   Women  1/2 Average Risk   3.4   3.3  Average Risk       5.0   4.4  2 X Average Risk   9.6   7.1  3 X Average Risk  23.4   11.0        Use the calculated Patient Ratio above and the CHD Risk Table to determine the patient's CHD Risk.        ATP III CLASSIFICATION (LDL):  <100     mg/dL   Optimal  161-096  mg/dL   Near or Above                    Optimal  130-159  mg/dL   Borderline  045-409  mg/dL   High  >811     mg/dL   Very High Performed at [facility], [address] [place], [place] [address]    HDL  Date Value Ref Range Status  11/20/2022 27 (L) >40 mg/dL Final  29/56/2130 36 (L) >39 mg/dL Final   Triglycerides  Date Value Ref Range Status  11/20/2022 45 <150 mg/dL Final         Passed - Patient is not pregnant      Passed - Valid encounter within last 12 months    Recent Outpatient Visits           4 weeks ago Essential hypertension   [place] [facility], [facility]   2 months ago Dilated  cardiomyopathy [facility])   [place] [facility], MD   2 months ago Dilated cardiomyopathy [facility])   [place] [facility] at [facility], MD   3 months ago Acute combined systolic and diastolic congestive heart failure [facility])   [place] [facility], MD   8 months ago Paronychia of left index finger   [place] [facility], Ocie Bob, MD       Future Appointments             In 5 months Elizabeth Sauer  C, MD [place] [name], [facility]             metoprolol succinate (TOPROL-XL) 25 MG 24 hr tablet 90 tablet 1    Sig: Take 1 tablet (25 mg total) by mouth daily.     Cardiovascular:  Beta Blockers Passed - 02/21/2023 11:12 AM      Passed - Last BP in normal range    BP Readings from Last 1 Encounters:  02/13/23 123/65         Passed - Last Heart Rate in normal range    Pulse Readings from Last 1 Encounters:  02/13/23 73         Passed - Valid encounter within last 6 months    Recent Outpatient Visits           4 weeks ago Essential hypertension   [place] [facility], MD   2 months ago Dilated cardiomyopathy [facility])   [place] [facility], MD   2 months ago Dilated cardiomyopathy [facility])   [place] [facility], MD   3 months ago Acute combined systolic and diastolic congestive heart failure [facility])   [place] [facility], MD   8 months ago Paronychia of left index finger   [place] [facility], Ocie Bob, MD       Future Appointments             In 5 months Duanne Limerick, MD [facility] at [facility]             spironolactone (ALDACTONE) 25 MG tablet 90 tablet 1    Sig: Take 1 tablet (25 mg total) by mouth daily.     Cardiovascular: Diuretics - Aldosterone Antagonist Passed - 02/21/2023 11:12 AM      Passed - Cr in normal range and within 180 days    Creatinine, Ser  Date Value Ref Range Status  02/13/2023 1.10 0.61 - 1.24 mg/dL Final         Passed - K in normal range and within 180 days    Potassium  Date Value Ref Range Status  02/13/2023 3.7 3.5 - 5.1 mmol/L Final         Passed - Na in normal range and within 180 days    Sodium  Date Value Ref Range Status  02/13/2023 139 135 - 145 mmol/L Final  11/28/2022 138 134 - 144 mmol/L Final         Passed - eGFR is 30 or above and within 180 days    GFR calc Af Amer  Date Value Ref Range Status  06/16/2019 69 >59 mL/min/1.73 Final   GFR, Estimated  Date Value Ref Range Status  02/13/2023 >60 >60 mL/min Final    Comment:    (NOTE) Calculated using the CKD-EPI Creatinine Equation (2021)    eGFR  Date Value Ref Range Status  11/28/2022 58 (L) >59 mL/min/1.73 Final         Passed - Last BP in normal range    BP Readings from Last 1 Encounters:  02/13/23 123/65         Passed - Valid encounter within last 6 months  Recent Outpatient Visits           4 weeks ago Essential hypertension   [place] [facility], MD   2 months ago Dilated cardiomyopathy [facility])   [place] [facility], MD   2 months ago Dilated cardiomyopathy [facility])   [place] [facility] at [facility], MD   3 months ago Acute combined systolic and diastolic congestive heart failure [facility])   [place] [facility], MD   8 months ago Paronychia of left index  finger   [place] [facility] at [facility], Ocie Bob, MD       Future Appointments             In 5 months Duanne Limerick, MD [facility] & [facility] at [facility], [facility]

## 2023-02-21 NOTE — Telephone Encounter (Signed)
Medication Refill - Medication: atorvastatin (LIPITOR) 40 MG tablet, metoprolol succinate (TOPROL-XL) 25 MG 24 hr tablet, and spironolactone (ALDACTONE) 25 MG tablet  Has the patient contacted their pharmacy? Yes.   Pharmacy called to request Rx be sent to them  Preferred Pharmacy (with phone number or street name):  [facility] - [place], [place] - [address] Phone: [phone]  Fax: [phone]     Has the patient been seen for an appointment in the last year OR does the patient have an upcoming appointment? Yes.    Agent: Please be advised that RX refills may take up to 3 business days. We ask that you follow-up with your pharmacy.

## 2023-03-13 ENCOUNTER — Telehealth: Payer: Self-pay

## 2023-03-13 NOTE — Telephone Encounter (Signed)
Called sister and left message- [facility] reached out stating that they spoke to pt and he was confused and having flutters/ pain. Tried to call pt and left message on sister Arman Bogus.Need to go to ER for further eval

## 2023-03-18 ENCOUNTER — Encounter: Payer: Self-pay | Admitting: Family

## 2023-03-18 ENCOUNTER — Ambulatory Visit: Payer: 59 | Attending: Family | Admitting: Family

## 2023-03-18 VITALS — BP 131/76 | HR 70 | Wt 216.6 lb

## 2023-03-18 DIAGNOSIS — I509 Heart failure, unspecified: Secondary | ICD-10-CM | POA: Diagnosis present

## 2023-03-18 DIAGNOSIS — Z79899 Other long term (current) drug therapy: Secondary | ICD-10-CM | POA: Insufficient documentation

## 2023-03-18 DIAGNOSIS — Z87891 Personal history of nicotine dependence: Secondary | ICD-10-CM | POA: Diagnosis not present

## 2023-03-18 DIAGNOSIS — I428 Other cardiomyopathies: Secondary | ICD-10-CM | POA: Insufficient documentation

## 2023-03-18 DIAGNOSIS — I10 Essential (primary) hypertension: Secondary | ICD-10-CM | POA: Diagnosis not present

## 2023-03-18 DIAGNOSIS — R0602 Shortness of breath: Secondary | ICD-10-CM | POA: Diagnosis not present

## 2023-03-18 DIAGNOSIS — I5022 Chronic systolic (congestive) heart failure: Secondary | ICD-10-CM | POA: Diagnosis not present

## 2023-03-18 DIAGNOSIS — I110 Hypertensive heart disease with heart failure: Secondary | ICD-10-CM | POA: Insufficient documentation

## 2023-03-18 DIAGNOSIS — Z7982 Long term (current) use of aspirin: Secondary | ICD-10-CM | POA: Diagnosis not present

## 2023-03-18 NOTE — Progress Notes (Signed)
PCP: Elizabeth Sauer, MD (last seen 05/24) Primary Cardiologist: none  HPI:  Scott Meyer is a 72 y/o male with a history of HTN, previous tobacco use and chronic heart failure.   Echo 11/19/22: EF 20-25% along with LVH along with Grade II DD and mild/moderate Scott. Echo 11/28/20: EF of 25-30% along with moderate LVH.   Admitted 11/18/22 due to SOB and increased pedal edema due to a/c HF exacerbation. Initially given IV lasix with transition to oral diuretics.   He presents today for a follow-up visit with a chief complaint of minimal SOB with moderate exertion. Chronic in nature. Has associated cough, pedal edema (improving), intermittent dizziness and intermittent trouble sleeping along with this. He also mentions that he's having urinary frequency and occasional dysuria. Denies fatigue, chest pain/ tightness, abdominal distention or weight gain.   At last visit, his torsemide was decreased to 10mg  daily due to frequent urination. He did decrease this but upon review of medications, he also had continued furosemide 20mg  daily  ROS: All systems negative except as listed in HPI, PMH and Problem List.  SH:  Social History   Socioeconomic History   Marital status: Single    Spouse name: Not on file   Number of children: Not on file   Years of education: Not on file   Highest education level: Not on file  Occupational History   Occupation: Retired  Tobacco Use   Smoking status: Former    Types: Cigarettes    Quit date: 10/09/2017    Years since quitting: 5.4   Smokeless tobacco: Never  Vaping Use   Vaping Use: Never used  Substance and Sexual Activity   Alcohol use: No    Comment: quit 8 months ago   Drug use: No   Sexual activity: Not on file  Other Topics Concern   Not on file  Social History Narrative   Lives in [place] w/ his sister.  Does not routinely exercise.   Social Determinants of Health   Financial Resource Strain: Low Risk  (05/24/2022)   Overall Financial Resource Strain  (CARDIA)    Difficulty of Paying Living Expenses: Not hard at all  Food Insecurity: No Food Insecurity (05/24/2022)   Hunger Vital Sign    Worried About Running Out of Food in the Last Year: Never true    Ran Out of Food in the Last Year: Never true  Transportation Needs: No Transportation Needs (05/24/2022)   PRAPARE - Administrator, Civil Service (Medical): No    Lack of Transportation (Non-Medical): No  Physical Activity: Inactive (05/24/2022)   Exercise Vital Sign    Days of Exercise per Week: 0 days    Minutes of Exercise per Session: 0 min  Stress: No Stress Concern Present (05/24/2022)   [employer] of Occupational Health - Occupational Stress Questionnaire    Feeling of Stress : Only a little  Social Connections: Socially Isolated (05/24/2022)   Social Connection and Isolation Panel [NHANES]    Frequency of Communication with Friends and Family: Never    Frequency of Social Gatherings with Friends and Family: Never    Attends Religious Services: Never    Database administrator or Organizations: No    Attends Banker Meetings: Never    Marital Status: Never married  Intimate Partner Violence: Not At Risk (05/24/2022)   Humiliation, Afraid, Rape, and Kick questionnaire    Fear of Current or Ex-Partner: No    Emotionally Abused: No  Physically Abused: No    Sexually Abused: No    FH:  Family History  Problem Relation Age of Onset   Diabetes Mother    Cirrhosis Father    Alcohol abuse Father    Other Brother        gsw   Cancer Brother        'head full of tumors'   Other Brother        'killed w/ 2x4 to the head'   Other Sister        mva    Past Medical History:  Diagnosis Date   Cardiomyopathy (HCC)    a. 11/2020 Echo: EF 25-30%, mod LVH, Gr1 DD. Mild AoV Ca2+ w/ mild to mod AoV sclerosis.   CHF (congestive heart failure) (HCC)    Hypertension     Current Outpatient Medications  Medication Sig Dispense Refill   acetaminophen  (TYLENOL) 500 MG tablet Take 500 mg by mouth every 6 (six) hours as needed for mild pain.     aspirin EC 81 MG tablet Take 1 tablet (81 mg total) by mouth daily. Swallow whole. (Patient not taking: Reported on 02/05/2023) 90 tablet 0   atorvastatin (LIPITOR) 40 MG tablet Take 1 tablet (40 mg total) by mouth daily. TAKE 1 TABLET(40 MG) BY MOUTH DAILY Strength: 40 mg 90 tablet 1   metoprolol succinate (TOPROL-XL) 25 MG 24 hr tablet Take 1 tablet (25 mg total) by mouth daily. 90 tablet 1   spironolactone (ALDACTONE) 25 MG tablet Take 1 tablet (25 mg total) by mouth daily. 90 tablet 1   torsemide (DEMADEX) 20 MG tablet Take 1 tablet (20 mg total) by mouth daily. 30 tablet 5   No current facility-administered medications for this visit.   Vitals:   03/18/23 1341  BP: 131/76  Pulse: 70  SpO2: 99%  Weight: 216 lb 9.6 oz (98.2 kg)   Wt Readings from Last 3 Encounters:  03/18/23 216 lb 9.6 oz (98.2 kg)  02/13/23 220 lb (99.8 kg)  02/05/23 230 lb 3.2 oz (104.4 kg)   Lab Results  Component Value Date   CREATININE 1.10 02/13/2023   CREATININE 1.31 (H) 11/28/2022   CREATININE 1.14 11/22/2022   PHYSICAL EXAM:  General:  Well appearing. No resp difficulty HEENT: normal Neck: supple. JVP flat. No lymphadenopathy or thryomegaly appreciated. Cor: PMI normal. Regular rate & rhythm. No rubs, gallops or murmurs. Lungs: clear Abdomen: soft, nontender, nondistended. No hepatosplenomegaly. No bruits or masses. Good bowel sounds. Extremities: no cyanosis, clubbing, rash, 1+ pitting edema right lower leg, trace pitting left lower leg Neuro: alert & oriented x3, cranial nerves grossly intact. Moves all 4 extremities w/o difficulty. Affect pleasant.   ECG: none   ASSESSMENT & PLAN:   1: NICM with reduced ejection fraction- - likely due to HTN - NYHA class II - euvolemic today - says that he's weighing daily; reminded to call for an overnight weight gain of > 2 pounds or a weekly weight gain of >  5 pounds - weight down 4 pounds from last visit here 5 weeks ago - Echo 11/19/22: EF 20-25% along with LVH along with Grade II DD and mild/moderate Scott.  - Echo 11/28/20: EF of 25-30% along with moderate LVH.  - not adding salt but "just a pinch"  - already rinses canned vegetables - GDMT is limited by patient's willingness to take more medications - continue metoprolol succinate 25mg  daily - continue spironolactone 25mg  daily - continue torsemide 10mg  daily -  removed the furosemide from his pill box and took his bottle of furosemide (with his permission) to be destroyed so there wouldn't be any more confusion - urinary symptoms should improve with only taking 1 diuretic as opposed to two of them - BNP 11/18/22 was 1074.7  2: HTN- - BP 131/76 - saw PCP Yetta Barre) 05/24 - BMP 02/13/23 reviewed and showed sodium 139, potassium 3.7, creatinine 1.10 and GFR >60 - continue aspirin 81mg  daily  Return in 2 months, sooner if needed.

## 2023-04-19 ENCOUNTER — Ambulatory Visit
Admission: EM | Admit: 2023-04-19 | Discharge: 2023-04-19 | Disposition: A | Discharge status: Home / Self Care | Payer: 59 | Attending: Physician Assistant | Admitting: Physician Assistant

## 2023-04-19 DIAGNOSIS — H60392 Other infective otitis externa, left ear: Secondary | ICD-10-CM | POA: Diagnosis not present

## 2023-04-19 MED ORDER — CIPROFLOXACIN-DEXAMETHASONE 0.3-0.1 % OT SUSP: 4.0000 [drp] | Freq: Two times a day (BID) | OTIC | 0 refills | Status: AC

## 2023-04-19 NOTE — ED Triage Notes (Signed)
Pt c/o possible ear infection in left ear.  Pt states that he is having pain  Pt took tylenol this morning for pain.  Pt states that he was here before for an ear infection 5 years ago and asks for the same thing. Pt saw Dr Adriana Simas in 2022 and was given ciprofloxacin.

## 2023-04-19 NOTE — ED Provider Notes (Signed)
[name]    CSN: [id number] Arrival date & time: 04/19/23  1239      History   Chief Complaint Chief Complaint  Patient presents with   Ear Pain    HPI Scott Meyer is a 72 y.o. male presenting for left ear pain, swelling, fullness.  Symptoms began today.  He has taken Tylenol for the discomfort and says it helped.  Patient reports history of ear infections.  Says he has received Ciprodex eardrops in the past and they have been helpful.  He request the same.  HPI  Past Medical History:  Diagnosis Date   Cardiomyopathy (HCC)    a. 11/2020 Echo: EF 25-30%, mod LVH, Gr1 DD. Mild AoV Ca2+ w/ mild to mod AoV sclerosis.   CHF (congestive heart failure) (HCC)    Hypertension     Patient Active Problem List   Diagnosis Date Noted   Acute exacerbation of CHF (congestive heart failure) (HCC) 11/19/2022   Anasarca 11/18/2022   Pulmonary edema 11/18/2022   Lower urinary tract symptoms (LUTS) 11/18/2022   Acidosis 11/18/2022   LFT elevation 11/18/2022   Troponin I above reference range 11/18/2022   Paronychia of left index finger 06/13/2022   Dilated cardiomyopathy (HCC) 11/30/2020   Atrial tachycardia 11/30/2020   Frequent PVCs 11/30/2020   Troponin level elevated 11/29/2020   Benign essential HTN 11/29/2020   HLD (hyperlipidemia) 11/29/2020   Hypomagnesemia 11/29/2020   Amputation of right arm (HCC) 11/29/2020   Acute combined systolic and diastolic congestive heart failure (HCC) 11/29/2020   NSVT (nonsustained ventricular tachycardia) (HCC) 11/29/2020   Chest pain 11/27/2020    Past Surgical History:  Procedure Laterality Date   ARM AMPUTATION AT ELBOW Right    KNEE SURGERY         Home Medications    Prior to Admission medications   Medication Sig Start Date End Date Taking? Authorizing Provider  acetaminophen (TYLENOL) 500 MG tablet Take 500 mg by mouth every 6 (six) hours as needed for mild pain.   Yes [provider]  aspirin EC 81 MG  tablet Take 1 tablet (81 mg total) by mouth daily. Swallow whole. 11/22/22  Yes Amin, Ankit Chirag, MD  atorvastatin (LIPITOR) 40 MG tablet Take 1 tablet (40 mg total) by mouth daily. TAKE 1 TABLET(40 MG) BY MOUTH DAILY Strength: 40 mg 01/23/23  Yes Duanne Limerick, MD  ciprofloxacin-dexamethasone (CIPRODEX) OTIC suspension Place 4 drops into the left ear 2 (two) times daily for 7 days. 04/19/23 04/26/23 Yes Eusebio Friendly B, PA-C  metoprolol succinate (TOPROL-XL) 25 MG 24 hr tablet Take 1 tablet (25 mg total) by mouth daily. 01/23/23  Yes Duanne Limerick, MD  spironolactone (ALDACTONE) 25 MG tablet Take 1 tablet (25 mg total) by mouth daily. 01/23/23  Yes Duanne Limerick, MD  torsemide (DEMADEX) 20 MG tablet Take 1 tablet (20 mg total) by mouth daily. Patient taking differently: Take 10 mg by mouth daily. 02/11/23 05/12/23 Yes Delma Freeze, FNP    Family History Family History  Problem Relation Age of Onset   Diabetes Mother    Cirrhosis Father    Alcohol abuse Father    Other Brother        gsw   Cancer Brother        'head full of tumors'   Other Brother        'killed w/ 2x4 to the head'   Other Sister        mva  Social History Social History   Tobacco Use   Smoking status: Former    Current packs/day: 0.00    Types: Cigarettes    Quit date: 10/09/2017    Years since quitting: 5.5   Smokeless tobacco: Never  Vaping Use   Vaping status: Never Used  Substance Use Topics   Alcohol use: No    Comment: quit 8 months ago   Drug use: No     Allergies   Patient has no known allergies.   Review of Systems Review of Systems  Constitutional:  Negative for fatigue and fever.  HENT:  Positive for ear pain and hearing loss. Negative for congestion and ear discharge.   Respiratory:  Negative for cough.   Neurological:  Negative for dizziness and headaches.     Physical Exam Triage Vital Signs ED Triage Vitals  Encounter Vitals Group     BP --      Systolic BP Percentile --       Diastolic BP Percentile --      Pulse --      Resp --      Temp --      Temp src --      SpO2 --      Weight 04/19/23 1310 219 lb (99.3 kg)     Height 04/19/23 1310 6\' 3"  (1.905 m)     Head Circumference --      Peak Flow --      Pain Score 04/19/23 1309 7     Pain Loc --      Pain Education --      Exclude from Growth Chart --    No data found.  Updated Vital Signs BP 119/72 (BP Location: Left Arm)   Pulse 70   Temp 97.7 F (36.5 C) (Oral)   Ht 6\' 3"  (1.905 m)   Wt 219 lb (99.3 kg)   SpO2 95%   BMI 27.37 kg/m       Physical Exam Vitals and nursing note reviewed.  Constitutional:      General: He is not in acute distress.    Appearance: Normal appearance. He is well-developed. He is not ill-appearing.  HENT:     Head: Normocephalic and atraumatic.     Right Ear: Tympanic membrane, ear canal and external ear normal.     Left Ear: Tympanic membrane, ear canal and external ear normal. Drainage and swelling present.     Nose: Nose normal.     Mouth/Throat:     Mouth: Mucous membranes are moist.     Pharynx: Oropharynx is clear.  Eyes:     Conjunctiva/sclera: Conjunctivae normal.  Cardiovascular:     Rate and Rhythm: Normal rate.  Pulmonary:     Effort: Pulmonary effort is normal. No respiratory distress.  Musculoskeletal:     Cervical back: Neck supple.  Skin:    General: Skin is warm and dry.     Capillary Refill: Capillary refill takes less than 2 seconds.  Neurological:     General: No focal deficit present.     Mental Status: He is alert. Mental status is at baseline.     Motor: No weakness.  Psychiatric:        Mood and Affect: Mood normal.        Behavior: Behavior normal.      UC Treatments / Results  Labs (all labs ordered are listed, but only abnormal results are displayed) Labs Reviewed - No data to display  EKG  Radiology No results found.  Procedures Procedures (including critical care time)  Medications Ordered in  UC Medications - No data to display  Initial Impression / Assessment and Plan / UC Course  I have reviewed the triage vital signs and the nursing notes.  Pertinent labs & imaging results that were available during my care of the patient were reviewed by me and considered in my medical decision making (see chart for details).   72 year old male presents for left ear pain.  On exam he has swelling of the EAC with yellowish debris in ear canal.  TM normal-appearing.  Consistent with otitis externa.  Sent Ciprodex to pharmacy.  Reviewed supportive care.  Reviewed return precautions.   Final Clinical Impressions(s) / UC Diagnoses   Final diagnoses:  Infective otitis externa of left ear   Discharge Instructions   None    ED Prescriptions     Medication Sig Dispense Auth. Provider   ciprofloxacin-dexamethasone (CIPRODEX) OTIC suspension Place 4 drops into the left ear 2 (two) times daily for 7 days. 7.5 mL Shirlee Latch, PA-C      PDMP not reviewed this encounter.   Shirlee Latch, PA-C 04/19/23 1407

## 2023-04-21 ENCOUNTER — Encounter: Payer: Self-pay | Admitting: Emergency Medicine

## 2023-04-21 ENCOUNTER — Ambulatory Visit
Admission: EM | Admit: 2023-04-21 | Discharge: 2023-04-21 | Disposition: A | Discharge status: Home / Self Care | Payer: 59

## 2023-04-21 DIAGNOSIS — H60333 Swimmer's ear, bilateral: Secondary | ICD-10-CM | POA: Diagnosis not present

## 2023-04-21 NOTE — ED Triage Notes (Signed)
Pt presents with bilateral ear pain and itching x 4 days. Pt was seen 04/19/23 and was prescribed ear drops. Pt states he can not get the drops in his ears because they are closed up.

## 2023-04-21 NOTE — ED Provider Notes (Signed)
[name]    CSN: [id number] Arrival date & time: 04/21/23  1027      History   Chief Complaint Chief Complaint  Patient presents with   Otalgia    HPI Scott Meyer is a 72 y.o. male.   Pleasant 72 year old male presents today as a follow-up due to continued otalgia.  He was seen 2 days ago and diagnosed with otitis externa, prescribed Ciprodex drops.  He states he is unable to get the drops in his ears because they are too swollen shut.  He is also having pain to palpation of this location and hearing loss bilaterally.  He is requesting advice on how to get the drops into his ears. He denies any new symptoms. Admits to using Qtips.   Otalgia   Past Medical History:  Diagnosis Date   Cardiomyopathy (HCC)    a. 11/2020 Echo: EF 25-30%, mod LVH, Gr1 DD. Mild AoV Ca2+ w/ mild to mod AoV sclerosis.   CHF (congestive heart failure) (HCC)    Hypertension     Patient Active Problem List   Diagnosis Date Noted   Acute exacerbation of CHF (congestive heart failure) (HCC) 11/19/2022   Anasarca 11/18/2022   Pulmonary edema 11/18/2022   Lower urinary tract symptoms (LUTS) 11/18/2022   Acidosis 11/18/2022   LFT elevation 11/18/2022   Troponin I above reference range 11/18/2022   Paronychia of left index finger 06/13/2022   Dilated cardiomyopathy (HCC) 11/30/2020   Atrial tachycardia 11/30/2020   Frequent PVCs 11/30/2020   Troponin level elevated 11/29/2020   Benign essential HTN 11/29/2020   HLD (hyperlipidemia) 11/29/2020   Hypomagnesemia 11/29/2020   Amputation of right arm (HCC) 11/29/2020   Acute combined systolic and diastolic congestive heart failure (HCC) 11/29/2020   NSVT (nonsustained ventricular tachycardia) (HCC) 11/29/2020   Chest pain 11/27/2020    Past Surgical History:  Procedure Laterality Date   ARM AMPUTATION AT ELBOW Right    KNEE SURGERY         Home Medications    Prior to Admission medications   Medication Sig Start Date End  Date Taking? Authorizing Provider  acetaminophen (TYLENOL) 500 MG tablet Take 500 mg by mouth every 6 (six) hours as needed for mild pain.    [provider]  aspirin EC 81 MG tablet Take 1 tablet (81 mg total) by mouth daily. Swallow whole. 11/22/22   Amin, Loura Halt, MD  atorvastatin (LIPITOR) 40 MG tablet Take 1 tablet (40 mg total) by mouth daily. TAKE 1 TABLET(40 MG) BY MOUTH DAILY Strength: 40 mg 01/23/23   Duanne Limerick, MD  ciprofloxacin-dexamethasone (CIPRODEX) OTIC suspension Place 4 drops into the left ear 2 (two) times daily for 7 days. 04/19/23 04/26/23  Eusebio Friendly B, PA-C  metoprolol succinate (TOPROL-XL) 25 MG 24 hr tablet Take 1 tablet (25 mg total) by mouth daily. 01/23/23   Duanne Limerick, MD  spironolactone (ALDACTONE) 25 MG tablet Take 1 tablet (25 mg total) by mouth daily. 01/23/23   Duanne Limerick, MD  torsemide (DEMADEX) 20 MG tablet Take 1 tablet (20 mg total) by mouth daily. Patient taking differently: Take 10 mg by mouth daily. 02/11/23 05/12/23  Delma Freeze, FNP    Family History Family History  Problem Relation Age of Onset   Diabetes Mother    Cirrhosis Father    Alcohol abuse Father    Other Brother        gsw   Cancer Brother        '  head full of tumors'   Other Brother        'killed w/ 2x4 to the head'   Other Sister        mva    Social History Social History   Tobacco Use   Smoking status: Former    Current packs/day: 0.00    Types: Cigarettes    Quit date: 10/09/2017    Years since quitting: 5.5   Smokeless tobacco: Never  Vaping Use   Vaping status: Never Used  Substance Use Topics   Alcohol use: No    Comment: quit 8 months ago   Drug use: No     Allergies   Patient has no known allergies.   Review of Systems Review of Systems  HENT:  Positive for ear pain.   As per HPI   Physical Exam Triage Vital Signs ED Triage Vitals  Encounter Vitals Group     BP 04/21/23 1051 (!) 138/94     Systolic BP Percentile --       Diastolic BP Percentile --      Pulse Rate 04/21/23 1051 70     Resp 04/21/23 1051 16     Temp 04/21/23 1051 97.8 F (36.6 C)     Temp Source 04/21/23 1051 Oral     SpO2 04/21/23 1051 99 %     Weight --      Height --      Head Circumference --      Peak Flow --      Pain Score 04/21/23 1050 8     Pain Loc --      Pain Education --      Exclude from Growth Chart --    No data found.  Updated Vital Signs BP (!) 138/94 (BP Location: Left Arm)   Pulse 70   Temp 97.8 F (36.6 C) (Oral)   Resp 16   SpO2 99%   Visual Acuity Right Eye Distance:   Left Eye Distance:   Bilateral Distance:    Right Eye Near:   Left Eye Near:    Bilateral Near:     Physical Exam Vitals and nursing note reviewed.  Constitutional:      General: He is not in acute distress.    Appearance: Normal appearance. He is not ill-appearing, toxic-appearing or diaphoretic.  HENT:     Head: Normocephalic and atraumatic. No right periorbital erythema or left periorbital erythema.     Jaw: There is normal jaw occlusion. No trismus, tenderness, swelling, pain on movement or malocclusion.     Salivary Glands: Right salivary gland is not diffusely enlarged or tender. Left salivary gland is not diffusely enlarged.     Right Ear: Hearing, tympanic membrane, ear canal and external ear normal. Drainage, swelling and tenderness present. No mastoid tenderness.     Left Ear: Ear canal normal. Drainage, swelling and tenderness present. No mastoid tenderness.     Ears:     Comments: Unable to visualize TM on B sides due to severe edema of B EAC.  Ear wick placed bilaterally Tenderness noted to palpation of B tragus and with movement of B helix NO mastoid tenderness, no preauricular lymphadenopathy    Nose: Nose normal. No nasal deformity, signs of injury, nasal tenderness or congestion.     Right Turbinates: Not enlarged or swollen.     Left Turbinates: Not enlarged or swollen.     Right Sinus: No maxillary sinus  tenderness or frontal sinus tenderness.  Left Sinus: No maxillary sinus tenderness or frontal sinus tenderness.     Mouth/Throat:     Lips: Pink. No lesions.     Mouth: Mucous membranes are moist. No injury or oral lesions.     Dentition: Abnormal dentition (missing most teeth).     Tongue: No lesions.     Palate: No lesions.     Pharynx: Oropharynx is clear. Uvula midline. No pharyngeal swelling, oropharyngeal exudate or posterior oropharyngeal erythema.  Neurological:     Mental Status: He is alert.      UC Treatments / Results  Labs (all labs ordered are listed, but only abnormal results are displayed) Labs Reviewed - No data to display  EKG   Radiology No results found.  Procedures Procedures (including critical care time)  Medications Ordered in UC Medications - No data to display  Initial Impression / Assessment and Plan / UC Course  I have reviewed the triage vital signs and the nursing notes.  Pertinent labs & imaging results that were available during my care of the patient were reviewed by me and considered in my medical decision making (see chart for details).     Acute B OE -patient with significant otitis externa bilaterally so much so that he is unable to get the drops to penetrate through the edema of his canal.  I placed an ear wick bilaterally and instructed patient to apply the drops topically to the ear wick, which will absorb the medication and help penetrate through the canal.  He was instructed to do this 4 drops twice daily for the next week.  He may take ibuprofen 600 mg every 8 hours as needed for discomfort.  He was complaining of some chronic hearing loss on the right, which should be followed up with his PCP or ENT.   Final Clinical Impressions(s) / UC Diagnoses   Final diagnoses:  Acute swimmer's ear of both sides     Discharge Instructions      I placed ear wicks in both of your ear canals, this will help absorb the Ciprodex  prescribed at your previous visit. It is extremely important that you put 4 drops into each ear twice daily for the next week.  This will help with the pain and the swelling. You may take 600 mg of ibuprofen every 8 hours as needed for pain or discomfort in the ears. For your chronic hearing loss on the right, I would recommend you follow-up with ENT or your PCP.    ED Prescriptions   None    PDMP not reviewed this encounter.   Maretta Bees, [place] 04/21/23 1151

## 2023-04-21 NOTE — Discharge Instructions (Signed)
I placed ear wicks in both of your ear canals, this will help absorb the Ciprodex prescribed at your previous visit. It is extremely important that you put 4 drops into each ear twice daily for the next week.  This will help with the pain and the swelling. You may take 600 mg of ibuprofen every 8 hours as needed for pain or discomfort in the ears. For your chronic hearing loss on the right, I would recommend you follow-up with ENT or your PCP.

## 2023-05-21 ENCOUNTER — Ambulatory Visit: Payer: 59 | Attending: Family | Admitting: Family

## 2023-05-21 ENCOUNTER — Encounter: Payer: Self-pay | Admitting: Family

## 2023-05-21 VITALS — BP 140/90 | HR 69 | Wt 217.0 lb

## 2023-05-21 DIAGNOSIS — Z87891 Personal history of nicotine dependence: Secondary | ICD-10-CM | POA: Diagnosis not present

## 2023-05-21 DIAGNOSIS — I428 Other cardiomyopathies: Secondary | ICD-10-CM | POA: Diagnosis not present

## 2023-05-21 DIAGNOSIS — I110 Hypertensive heart disease with heart failure: Secondary | ICD-10-CM | POA: Insufficient documentation

## 2023-05-21 DIAGNOSIS — I10 Essential (primary) hypertension: Secondary | ICD-10-CM | POA: Diagnosis not present

## 2023-05-21 DIAGNOSIS — I5022 Chronic systolic (congestive) heart failure: Secondary | ICD-10-CM | POA: Diagnosis not present

## 2023-05-21 NOTE — Progress Notes (Signed)
PCP: Elizabeth Sauer, MD (last seen 05/24) Primary Cardiologist: none  HPI:  Scott Meyer is a 72 y/o male with a history of HTN, previous tobacco use and chronic heart failure.   Admitted 11/18/22 due to SOB and increased pedal edema due to a/c HF exacerbation. Initially given IV lasix with transition to oral diuretics. Has had 2 recent urgent care visits due to swimmer's ear.    Echo 11/28/20: EF of 25-30% along with moderate LVH. Echo 11/19/22: EF 20-25% along with LVH along with Grade II DD and mild/moderate Scott.    He presents today for a follow-up visit with a chief complaint of minimal SOB with moderate exertion. Chronic in nature. He has associated pedal edema and continued issues with urinary frequency. Denies fatigue, chest pain, palpitations, cough, dizziness or trouble sleeping. Occasionally feels "wobbly" but that he no longer feels "that bad feeling".   He says that the ball in his chest has returned and he feels better and this occurred after the last full moon. He feels like an electromagnetic socket some times and sometimes feels like he's breathing too much but never too less.   At last visit, he was taking both torsemide and furosemide and the furosemide was taken from him. He says that he's been taking 1/2 tablet of his torsemide every other day although originally he said it was the spironolactone as 1/2 tablet every other day. Once the pills were shown to him, he said it was the torsemide that he is taking 1/2 tablet every other day because every day was too much for him. He's also breaking the atorvastatin in half because a whole tablet made him feel bad.   ROS: All systems negative except as listed in HPI, PMH and Problem List.  SH:  Social History   Socioeconomic History   Marital status: Single    Spouse name: Not on file   Number of children: Not on file   Years of education: Not on file   Highest education level: Not on file  Occupational History   Occupation: Retired   Tobacco Use   Smoking status: Former    Current packs/day: 0.00    Types: Cigarettes    Quit date: 10/09/2017    Years since quitting: 5.6   Smokeless tobacco: Never  Vaping Use   Vaping status: Never Used  Substance and Sexual Activity   Alcohol use: No    Comment: quit 8 months ago   Drug use: No   Sexual activity: Not on file  Other Topics Concern   Not on file  Social History Narrative   Lives in [place] w/ his sister.  Does not routinely exercise.   Social Determinants of Health   Financial Resource Strain: Low Risk  (05/24/2022)   Overall Financial Resource Strain (CARDIA)    Difficulty of Paying Living Expenses: Not hard at all  Food Insecurity: No Food Insecurity (05/24/2022)   Hunger Vital Sign    Worried About Running Out of Food in the Last Year: Never true    Ran Out of Food in the Last Year: Never true  Transportation Needs: No Transportation Needs (05/24/2022)   PRAPARE - Administrator, Civil Service (Medical): No    Lack of Transportation (Non-Medical): No  Physical Activity: Inactive (05/24/2022)   Exercise Vital Sign    Days of Exercise per Week: 0 days    Minutes of Exercise per Session: 0 min  Stress: No Stress Concern Present (05/24/2022)   [place]  [employer] - Occupational Stress Questionnaire    Feeling of Stress : Only a little  Social Connections: Socially Isolated (05/24/2022)   Social Connection and Isolation Panel [NHANES]    Frequency of Communication with Friends and Family: Never    Frequency of Social Gatherings with Friends and Family: Never    Attends Religious Services: Never    Database administrator or Organizations: No    Attends Banker Meetings: Never    Marital Status: Never married  Intimate Partner Violence: Not At Risk (05/24/2022)   Humiliation, Afraid, Rape, and Kick questionnaire    Fear of Current or Ex-Partner: No    Emotionally Abused: No    Physically Abused: No    Sexually Abused:  No    FH:  Family History  Problem Relation Age of Onset   Diabetes Mother    Cirrhosis Father    Alcohol abuse Father    Other Brother        gsw   Cancer Brother        'head full of tumors'   Other Brother        'killed w/ 2x4 to the head'   Other Sister        mva    Past Medical History:  Diagnosis Date   Cardiomyopathy (HCC)    a. 11/2020 Echo: EF 25-30%, mod LVH, Gr1 DD. Mild AoV Ca2+ w/ mild to mod AoV sclerosis.   CHF (congestive heart failure) (HCC)    Hypertension     Current Outpatient Medications  Medication Sig Dispense Refill   acetaminophen (TYLENOL) 500 MG tablet Take 500 mg by mouth every 6 (six) hours as needed for mild pain.     aspirin EC 81 MG tablet Take 1 tablet (81 mg total) by mouth daily. Swallow whole. 90 tablet 0   atorvastatin (LIPITOR) 40 MG tablet Take 1 tablet (40 mg total) by mouth daily. TAKE 1 TABLET(40 MG) BY MOUTH DAILY Strength: 40 mg 90 tablet 1   metoprolol succinate (TOPROL-XL) 25 MG 24 hr tablet Take 1 tablet (25 mg total) by mouth daily. 90 tablet 1   spironolactone (ALDACTONE) 25 MG tablet Take 1 tablet (25 mg total) by mouth daily. 90 tablet 1   torsemide (DEMADEX) 20 MG tablet Take 1 tablet (20 mg total) by mouth daily. (Patient taking differently: Take 10 mg by mouth daily.) 30 tablet 5   No current facility-administered medications for this visit.   Vitals:   05/21/23 1412 05/21/23 1420 05/21/23 1438  BP: (!) 141/86 (!) 156/96 (!) 140/90  Pulse: 76 69   SpO2: 100% 99%   Weight: 217 lb (98.4 kg)     Wt Readings from Last 3 Encounters:  05/21/23 217 lb (98.4 kg)  04/19/23 219 lb (99.3 kg)  03/18/23 216 lb 9.6 oz (98.2 kg)   Lab Results  Component Value Date   CREATININE 1.10 02/13/2023   CREATININE 1.31 (H) 11/28/2022   CREATININE 1.14 11/22/2022   PHYSICAL EXAM:  General:  Well appearing. No resp difficulty HEENT: normal Neck: supple. JVP flat. No lymphadenopathy or thryomegaly appreciated. Cor: PMI normal.  Regular rate & rhythm. No rubs, gallops or murmurs. Lungs: clear Abdomen: soft, nontender, nondistended. No hepatosplenomegaly. No bruits or masses. Good bowel sounds. Extremities: no cyanosis, clubbing, rash, 1+ pitting edema right lower leg, none left lower leg Neuro: alert & oriented x3, cranial nerves grossly intact. Moves all 4 extremities w/o difficulty. Affect pleasant.   ECG:  none   ASSESSMENT & PLAN:   1: NICM with reduced ejection fraction- - likely due to HTN - NYHA class II - euvolemic today - says that he's weighing daily; reminded to call for an overnight weight gain of > 2 pounds or a weekly weight gain of > 5 pounds - weight unchanged from last visit here 2 months ago - Echo 11/28/20: EF of 25-30% along with moderate LVH.  - Echo 11/19/22: EF 20-25% along with LVH along with Grade II DD and mild/moderate Scott.  - not adding salt but "just a pinch"  - already rinses canned vegetables - GDMT is limited by patient's willingness to take more medications; would like to try him on SGLT but he's already complaining of frequent urination - continue metoprolol succinate 25mg  daily - continue spironolactone 25mg  daily - continue torsemide 10mg  every other day - BNP 11/18/22 was 1074.7  2: HTN- - BP 140/90 - saw PCP Yetta Barre) 05/24 - BMP 02/13/23 reviewed and showed sodium 139, potassium 3.7, creatinine 1.10 and GFR >60 - continue aspirin 81mg  daily - taking atorvastatin 20mg  daily (per patient choice)  Return in 6 months, sooner if needed.

## 2023-05-21 NOTE — Patient Instructions (Signed)
It was good to see you today!

## 2023-06-11 ENCOUNTER — Ambulatory Visit (INDEPENDENT_AMBULATORY_CARE_PROVIDER_SITE_OTHER): Payer: 59

## 2023-06-11 DIAGNOSIS — Z0000 Encounter for general adult medical examination without abnormal findings: Secondary | ICD-10-CM

## 2023-06-11 NOTE — Progress Notes (Signed)
Subjective:   Scott Meyer is a 72 y.o. male who presents for Medicare Annual/Subsequent preventive examination.  Visit Complete: Virtual  I connected with  Scott Meyer on 06/11/23 by a audio enabled telemedicine application and verified that I am speaking with the correct person using two identifiers.  Patient Location: Home  Provider Location: Office/Clinic  I discussed the limitations of evaluation and management by telemedicine. The patient expressed understanding and agreed to proceed.  Because this visit was a virtual/telehealth visit, some criteria may be missing or patient reported. Any vitals not documented were not able to be obtained and vitals that have been documented are patient reported.   Cardiac Risk Factors include: advanced age (>61men, >13 women);hypertension;male gender;dyslipidemia;sedentary lifestyle     Objective:    Today's Vitals   06/11/23 1500  PainSc: 0-No pain   There is no height or weight on file to calculate BMI.     06/11/2023    3:04 PM 04/19/2023    1:14 PM 11/19/2022   12:00 PM 11/28/2020   11:21 AM 11/27/2020    5:51 PM 04/08/2019   12:07 PM 06/21/2018   10:16 AM  Advanced Directives  Does Patient Have a Medical Advance Directive? No No No No No No No  Would patient like information on creating a medical advance directive? No - Patient declined  No - Patient declined No - Patient declined No - Patient declined  No - Patient declined    Current Medications (verified) Outpatient Encounter Medications as of 06/11/2023  Medication Sig   acetaminophen (TYLENOL) 500 MG tablet Take 500 mg by mouth every 6 (six) hours as needed for mild pain.   aspirin EC 81 MG tablet Take 1 tablet (81 mg total) by mouth daily. Swallow whole.   atorvastatin (LIPITOR) 40 MG tablet Take 1 tablet (40 mg total) by mouth daily. TAKE 1 TABLET(40 MG) BY MOUTH DAILY Strength: 40 mg (Patient taking differently: Take 20 mg by mouth daily. TAKE 1 TABLET(40 MG) BY MOUTH  DAILY Strength: 40 mg)   metoprolol succinate (TOPROL-XL) 25 MG 24 hr tablet Take 1 tablet (25 mg total) by mouth daily.   spironolactone (ALDACTONE) 25 MG tablet Take 1 tablet (25 mg total) by mouth daily.   torsemide (DEMADEX) 20 MG tablet Take 1 tablet (20 mg total) by mouth daily. (Patient taking differently: Take 10 mg by mouth every other day.)   No facility-administered encounter medications on file as of 06/11/2023.    Allergies (verified) Patient has no known allergies.   History: Past Medical History:  Diagnosis Date   Cardiomyopathy (HCC)    a. 11/2020 Echo: EF 25-30%, mod LVH, Gr1 DD. Mild AoV Ca2+ w/ mild to mod AoV sclerosis.   CHF (congestive heart failure) (HCC)    Hypertension    Past Surgical History:  Procedure Laterality Date   ARM AMPUTATION AT ELBOW Right    KNEE SURGERY     Family History  Problem Relation Age of Onset   Diabetes Mother    Cirrhosis Father    Alcohol abuse Father    Other Brother        gsw   Cancer Brother        'head full of tumors'   Other Brother        'killed w/ 2x4 to the head'   Other Sister        mva   Social History   Socioeconomic History   Marital status: Single    Spouse  name: Not on file   Number of children: Not on file   Years of education: Not on file   Highest education level: Not on file  Occupational History   Occupation: Retired  Tobacco Use   Smoking status: Former    Current packs/day: 0.00    Types: Cigarettes    Quit date: 10/09/2017    Years since quitting: 5.6   Smokeless tobacco: Never  Vaping Use   Vaping status: Never Used  Substance and Sexual Activity   Alcohol use: No    Comment: quit 8 months ago   Drug use: No   Sexual activity: Not on file  Other Topics Concern   Not on file  Social History Narrative   Lives in [place] w/ his sister.  Does not routinely exercise.   Social Determinants of Health   Financial Resource Strain: Low Risk  (06/11/2023)   Overall Financial Resource  Strain (CARDIA)    Difficulty of Paying Living Expenses: Not hard at all  Food Insecurity: No Food Insecurity (06/11/2023)   Hunger Vital Sign    Worried About Running Out of Food in the Last Year: Never true    Ran Out of Food in the Last Year: Never true  Transportation Needs: No Transportation Needs (06/11/2023)   PRAPARE - Administrator, Civil Service (Medical): No    Lack of Transportation (Non-Medical): No  Physical Activity: Inactive (06/11/2023)   Exercise Vital Sign    Days of Exercise per Week: 0 days    Minutes of Exercise per Session: 0 min  Stress: No Stress Concern Present (06/11/2023)   [employer] of Occupational Health - Occupational Stress Questionnaire    Feeling of Stress : Not at all  Social Connections: Socially Isolated (06/11/2023)   Social Connection and Isolation Panel [NHANES]    Frequency of Communication with Friends and Family: Never    Frequency of Social Gatherings with Friends and Family: Never    Attends Religious Services: Never    Database administrator or Organizations: No    Attends Engineer, structural: Never    Marital Status: Never married    Tobacco Counseling Counseling given: Not Answered   Clinical Intake:  Pre-visit preparation completed: Yes  Pain : No/denies pain Pain Score: 0-No pain     Nutritional Risks: None Diabetes: No  How often do you need to have someone help you when you read instructions, pamphlets, or other written materials from your doctor or pharmacy?: 1 - Never  Interpreter Needed?: No  Information entered by :: Kennedy Bucker, LPN   Activities of Daily Living    06/11/2023    3:05 PM 11/19/2022   12:00 PM  In your present state of health, do you have any difficulty performing the following activities:  Hearing? 0 0  Vision? 0 0  Difficulty concentrating or making decisions? 0 0  Walking or climbing stairs? 1 0  Dressing or bathing? 0 0  Doing errands, shopping? 0 0   Preparing Food and eating ? N   Using the Toilet? N   In the past six months, have you accidently leaked urine? N   Do you have problems with loss of bowel control? N   Managing your Medications? N   Managing your Finances? N   Housekeeping or managing your Housekeeping? N     Patient Care Team: Duanne Limerick, MD as PCP - General (Family Medicine) End, Cristal Deer, MD as PCP - Cardiology (Cardiology)  Indicate any recent Medical Services you may have received from other than [facility] providers in the past year (date may be approximate).     Assessment:   This is a routine wellness examination for Scott Meyer.  Hearing/Vision screen Hearing Screening - Comments:: No aids Vision Screening - Comments:: Readers-    Goals Addressed             This Visit's Progress    DIET - EAT MORE FRUITS AND VEGETABLES         Depression Screen    06/11/2023    3:02 PM 01/23/2023   10:29 AM 11/28/2022    2:49 PM 11/18/2022    2:53 PM 06/13/2022   10:58 AM 05/24/2022   11:25 AM 04/16/2022    1:09 PM  PHQ 2/9 Scores  PHQ - 2 Score 0 0 0 0 0 0 0  PHQ- 9 Score 0 0 0 0 0      Fall Risk    06/11/2023    3:04 PM 01/23/2023   10:28 AM 11/28/2022    2:49 PM 11/18/2022    2:53 PM 06/13/2022   10:58 AM  Fall Risk   Falls in the past year? 1 0 0 0 0  Number falls in past yr: 1 0 0 0 0  Injury with Fall?  0 0 0 0  Risk for fall due to : History of fall(s) No Fall Risks No Fall Risks No Fall Risks No Fall Risks  Follow up Falls prevention discussed;Falls evaluation completed Falls evaluation completed Falls evaluation completed Falls evaluation completed Falls evaluation completed    MEDICARE RISK AT HOME: Medicare Risk at Home Any stairs in or around the home?: No If so, are there any without handrails?: No Home free of loose throw rugs in walkways, pet beds, electrical cords, etc?: Yes Adequate lighting in your home to reduce risk of falls?: Yes Life alert?: No Use of a cane, walker or w/c?:  No Grab bars in the bathroom?: Yes Shower chair or bench in shower?: No Elevated toilet seat or a handicapped toilet?: No  TIMED UP AND GO:  Was the test performed?  No    Cognitive Function:        06/11/2023    3:06 PM 05/24/2022   11:31 AM  6CIT Screen  What Year? 0 points 0 points  What month? 0 points 0 points  What time? 3 points 0 points  Count back from 20 0 points 0 points  Months in reverse 0 points 0 points  Repeat phrase 0 points 2 points  Total Score 3 points 2 points    Immunizations Immunization History  Administered Date(s) Administered   Fluad Quad(high Dose 65+) 06/16/2019, 05/24/2022   PNEUMOCOCCAL CONJUGATE-20 05/24/2022    TDAP status: Due, Education has been provided regarding the importance of this vaccine. Advised may receive this vaccine at local pharmacy or Health Dept. Aware to provide a copy of the vaccination record if obtained from local pharmacy or Health Dept. Verbalized acceptance and understanding.  Flu Vaccine status: Up to date  Pneumococcal vaccine status: Up to date  Covid-19 vaccine status: Declined, Education has been provided regarding the importance of this vaccine but patient still declined. Advised may receive this vaccine at local pharmacy or Health Dept.or vaccine clinic. Aware to provide a copy of the vaccination record if obtained from local pharmacy or Health Dept. Verbalized acceptance and understanding.  Qualifies for Shingles Vaccine? Yes   Zostavax completed No   Shingrix  Completed?: No.    Education has been provided regarding the importance of this vaccine. Patient has been advised to call insurance company to determine out of pocket expense if they have not yet received this vaccine. Advised may also receive vaccine at local pharmacy or Health Dept. Verbalized acceptance and understanding.  Screening Tests Health Maintenance  Topic Date Due   Zoster Vaccines- Shingrix (1 of 2) Never done   INFLUENZA VACCINE   04/17/2023   COVID-19 Vaccine (1 - 2023-24 season) Never done   Colonoscopy  11/28/2023 (Originally [birth year]/1995)   Medicare Annual Wellness (AWV)  06/10/2024   Pneumonia Vaccine 16+ Years old  Completed   Hepatitis C Screening  Completed   HPV VACCINES  Aged Out   DTaP/Tdap/Td  Discontinued    Health Maintenance  Health Maintenance Due  Topic Date Due   Zoster Vaccines- Shingrix (1 of 2) Never done   INFLUENZA VACCINE  04/17/2023   COVID-19 Vaccine (1 - 2023-24 season) Never done    Declined referral for colonoscopy  Lung Cancer Screening: (Low Dose CT Chest recommended if Age 59-80 years, 20 pack-year currently smoking OR have quit w/in 15years.) does not qualify.    Additional Screening:  Hepatitis C Screening: does qualify; Completed 11/18/22  Vision Screening: Recommended annual ophthalmology exams for early detection of glaucoma and other disorders of the eye. Is the patient up to date with their annual eye exam?  No  Who is the provider or what is the name of the office in which the patient attends annual eye exams? No one If pt is not established with a provider, would they like to be referred to a provider to establish care? No .   Dental Screening: Recommended annual dental exams for proper oral hygiene   Community Resource Referral / Chronic Care Management: CRR required this visit?  No   CCM required this visit?  No     Plan:     I have personally reviewed and noted the following in the patient's chart:   Medical and social history Use of alcohol, tobacco or illicit drugs  Current medications and supplements including opioid prescriptions. Patient is not currently taking opioid prescriptions. Functional ability and status Nutritional status Physical activity Advanced directives List of other physicians Hospitalizations, surgeries, and ER visits in previous 12 months Vitals Screenings to include cognitive, depression, and falls Referrals and  appointments  In addition, I have reviewed and discussed with patient certain preventive protocols, quality metrics, and best practice recommendations. A written personalized care plan for preventive services as well as general preventive health recommendations were provided to patient.     Hal Hope, LPN   1/61/0960   After Visit Summary: (MyChart) Due to this being a telephonic visit, the after visit summary with patients personalized plan was offered to patient via MyChart   Nurse Notes: none

## 2023-06-11 NOTE — Patient Instructions (Addendum)
Scott Meyer , Thank you for taking time to come for your Medicare Wellness Visit. I appreciate your ongoing commitment to your health goals. Please review the following plan we discussed and let me know if I can assist you in the future.   Referrals/Orders/Follow-Ups/Clinician Recommendations: none  This is a list of the screening recommended for you and due dates:  Health Maintenance  Topic Date Due   Zoster (Shingles) Vaccine (1 of 2) Never done   Flu Shot  04/17/2023   COVID-19 Vaccine (1 - 2023-24 season) Never done   Colon Cancer Screening  11/28/2023*   Medicare Annual Wellness Visit  06/10/2024   Pneumonia Vaccine  Completed   Hepatitis C Screening  Completed   HPV Vaccine  Aged Out   DTaP/Tdap/Td vaccine  Discontinued  *Topic was postponed. The date shown is not the original due date.    Advanced directives: (ACP Link)Information on Advanced Care Planning can be found at [facility] Directives Advance Health Care Directives ([email or web address]   Next Medicare Annual Wellness Visit scheduled for next year: Yes  06/16/24 @ 1:40 pm in person

## 2023-07-28 ENCOUNTER — Ambulatory Visit: Payer: 59 | Admitting: Family Medicine

## 2023-10-08 ENCOUNTER — Ambulatory Visit: Payer: Self-pay | Admitting: *Deleted

## 2023-10-08 NOTE — Telephone Encounter (Signed)
  Reason for Disposition  [1] Palpitations AND [2] no improvement after using Care Advice  Answer Assessment - Initial Assessment Questions 1. DESCRIPTION: "Please describe your heart rate or heartbeat that you are having" (e.g., fast/slow, regular/irregular, skipped or extra beats, "palpitations")     NO at home visit- calling to report she has never heard a heart rate like this- possible gallop/extra beats, hard to "feel" palpate 2. ONSET: "When did it start?" (Minutes, hours or days)      Ongoing- patient states he has felt chest pressure for over 1 year 3. DURATION: "How long does it last" (e.g., seconds, minutes, hours)     Patient has no symptoms of irregular heart rate  6. HEART RATE: "Can you tell me your heart rate?" "How many beats in 15 seconds?"  (Note: not all patients can do this)       Palpated 42- hard to feel and unable to feel in legs due to swelling  9. CARDIAC HISTORY: "Do you have any history of heart disease?" (e.g., heart attack, angina, bypass surgery, angioplasty, arrhythmia)      Atrial tachycardia, CHF 10. OTHER SYMPTOMS: "Do you have any other symptoms?" (e.g., dizziness, chest pain, sweating, difficulty breathing)       Possible neuropathy in legs/feet  Protocols used: Heart Rate and Heartbeat Questions-A-AH

## 2023-10-08 NOTE — Telephone Encounter (Signed)
  Chief Complaint: NP- Scott Meyer- House Calls- requesting appointment for patient- concerns - irregular heart rate- unable to palpate pulse correctly.  Symptoms: patient is asymptomatic-but does state he has felt chest pressure for over 1 year. NP request possible EKG, check for neuropathy in legs/feet- unable to palpate pulse in feet due to swelling Frequency: home visit today Pertinent Negatives: Patient denies chest pain Disposition: [] ED /[] Urgent Care (no appt availability in office) / [x] Appointment(In office/virtual)/ []  [facility]/ [] Home Care/ [] Refused Recommended Disposition /[] [facility]/ []  Follow-up with PCP Additional Notes: NP has concerns: erratic heart rate- unable to palpate , symptoms of neuropathy in legs/feet. NP is requesting appointment for patient to be evaluated in office. Appointment scheduled

## 2023-10-09 ENCOUNTER — Ambulatory Visit (INDEPENDENT_AMBULATORY_CARE_PROVIDER_SITE_OTHER): Payer: 59 | Admitting: Family Medicine

## 2023-10-09 ENCOUNTER — Encounter: Payer: Self-pay | Admitting: Family Medicine

## 2023-10-09 VITALS — BP 120/80 | Ht 75.0 in | Wt 216.4 lb

## 2023-10-09 DIAGNOSIS — I4729 Other ventricular tachycardia: Secondary | ICD-10-CM | POA: Diagnosis not present

## 2023-10-09 DIAGNOSIS — I493 Ventricular premature depolarization: Secondary | ICD-10-CM

## 2023-10-09 DIAGNOSIS — R0989 Other specified symptoms and signs involving the circulatory and respiratory systems: Secondary | ICD-10-CM

## 2023-10-09 NOTE — Progress Notes (Signed)
Date:  10/09/2023   Name:  Scott Meyer   DOB:  [birth year]   MRN:  [id number]   Chief Complaint: Irregular Heart Beat (Patient had a home visit yesterday and nurse told him that he needed to see his doctor fro irregular heart beat. )  Palpitations  This is a new problem. The current episode started yesterday. The problem has been unchanged. Associated symptoms include an irregular heartbeat. Pertinent negatives include no anxiety, chest fullness, chest pain, coughing, diaphoresis, dizziness, fever, malaise/fatigue, nausea, near-syncope, numbness, shortness of breath, syncope, vomiting or weakness. Associated symptoms comments: With history chronic systolic heart failureSees cardiology. The treatment provided mild relief.    Lab Results  Component Value Date   NA 139 02/13/2023   K 3.7 02/13/2023   CO2 24 02/13/2023   GLUCOSE 85 02/13/2023   BUN 22 02/13/2023   CREATININE 1.10 02/13/2023   CALCIUM 9.1 02/13/2023   EGFR 58 (L) 11/28/2022   GFRNONAA >60 02/13/2023   Lab Results  Component Value Date   CHOL 115 11/20/2022   HDL 27 (L) 11/20/2022   LDLCALC 79 11/20/2022   TRIG 45 11/20/2022   CHOLHDL 4.3 11/20/2022   Lab Results  Component Value Date   TSH 1.745 11/27/2020   Lab Results  Component Value Date   HGBA1C 5.5 11/19/2022   Lab Results  Component Value Date   WBC 6.0 11/28/2022   HGB 14.0 11/28/2022   HCT 42.5 11/28/2022   MCV 93 11/28/2022   PLT 143 (L) 11/28/2022   Lab Results  Component Value Date   ALT 42 11/19/2022   AST 39 11/19/2022   ALKPHOS 80 11/19/2022   BILITOT 2.4 (H) 11/19/2022   No results found for: "25OHVITD2", "25OHVITD3", "VD25OH"   Review of Systems  Constitutional:  Negative for diaphoresis, fever and malaise/fatigue.  HENT:  Negative for congestion.   Respiratory:  Negative for cough, chest tightness, shortness of breath and wheezing.   Cardiovascular:  Positive for palpitations and leg swelling. Negative for chest pain,  syncope and near-syncope.  Gastrointestinal:  Negative for abdominal pain, nausea and vomiting.  Neurological:  Negative for dizziness, syncope, weakness and numbness.  Psychiatric/Behavioral:  The patient is not nervous/anxious.     Patient Active Problem List   Diagnosis Date Noted   Acute exacerbation of CHF (congestive heart failure) (HCC) 11/19/2022   Anasarca 11/18/2022   Pulmonary edema 11/18/2022   Lower urinary tract symptoms (LUTS) 11/18/2022   Acidosis 11/18/2022   LFT elevation 11/18/2022   Troponin I above reference range 11/18/2022   Paronychia of left index finger 06/13/2022   Dilated cardiomyopathy (HCC) 11/30/2020   Atrial tachycardia (HCC) 11/30/2020   Frequent PVCs 11/30/2020   Troponin level elevated 11/29/2020   Benign essential HTN 11/29/2020   HLD (hyperlipidemia) 11/29/2020   Hypomagnesemia 11/29/2020   Amputation of right arm (HCC) 11/29/2020   Acute combined systolic and diastolic congestive heart failure (HCC) 11/29/2020   NSVT (nonsustained ventricular tachycardia) (HCC) 11/29/2020   Chest pain 11/27/2020    No Known Allergies  Past Surgical History:  Procedure Laterality Date   ARM AMPUTATION AT ELBOW Right    KNEE SURGERY      Social History   Tobacco Use   Smoking status: Former    Current packs/day: 0.00    Types: Cigarettes    Quit date: 10/09/2017    Years since quitting: 6.0   Smokeless tobacco: Never  Vaping Use   Vaping status: Never Used  Substance Use  Topics   Alcohol use: No    Comment: quit 8 months ago   Drug use: No     Medication list has been reviewed and updated.  Current Meds  Medication Sig   aspirin EC 81 MG tablet Take 1 tablet (81 mg total) by mouth daily. Swallow whole.   atorvastatin (LIPITOR) 40 MG tablet Take 1 tablet (40 mg total) by mouth daily. TAKE 1 TABLET(40 MG) BY MOUTH DAILY Strength: 40 mg (Patient taking differently: Take 20 mg by mouth daily. TAKE 1 TABLET(40 MG) BY MOUTH DAILY Strength: 40  mg)   metoprolol succinate (TOPROL-XL) 25 MG 24 hr tablet Take 1 tablet (25 mg total) by mouth daily.   spironolactone (ALDACTONE) 25 MG tablet Take 1 tablet (25 mg total) by mouth daily.   torsemide (DEMADEX) 20 MG tablet Take 1 tablet (20 mg total) by mouth daily. (Patient taking differently: Take 10 mg by mouth every other day.)       10/09/2023    2:08 PM 01/23/2023   10:29 AM 11/28/2022    2:50 PM 11/18/2022    2:53 PM  GAD 7 : Generalized Anxiety Score  Nervous, Anxious, on Edge 1 0 0 0  Control/stop worrying 2 0 0 0  Worry too much - different things 2 0 0 0  Trouble relaxing 0 0 0 0  Restless 1 0 0 0  Easily annoyed or irritable 0 0 0 0  Afraid - awful might happen 1 0 0 0  Total GAD 7 Score 7 0 0 0  Anxiety Difficulty Somewhat difficult Not difficult at all Not difficult at all Not difficult at all       10/09/2023    2:07 PM 06/11/2023    3:02 PM 01/23/2023   10:29 AM  Depression screen PHQ 2/9  Decreased Interest 1 0 0  Down, Depressed, Hopeless 1 0 0  PHQ - 2 Score 2 0 0  Altered sleeping 0 0 0  Tired, decreased energy 1 0 0  Change in appetite 1 0 0  Feeling bad or failure about yourself  0 0 0  Trouble concentrating 1 0 0  Moving slowly or fidgety/restless 0 0 0  Suicidal thoughts 0 0 0  PHQ-9 Score 5 0 0  Difficult doing work/chores Somewhat difficult Not difficult at all Not difficult at all    BP Readings from Last 3 Encounters:  10/09/23 120/80  05/21/23 (!) 140/90  04/21/23 (!) 138/94    Physical Exam Vitals and nursing note reviewed.  Constitutional:      Appearance: He is well-developed.  HENT:     Head: Normocephalic and atraumatic.     Right Ear: Tympanic membrane, ear canal and external ear normal.     Left Ear: Tympanic membrane, ear canal and external ear normal.     Nose: Nose normal.     Mouth/Throat:     Dentition: Normal dentition.  Eyes:     General: Lids are normal. No scleral icterus. Neck:     Thyroid: No thyromegaly.      Vascular: No carotid bruit, hepatojugular reflux or JVD.     Trachea: No tracheal deviation.  Cardiovascular:     Rate and Rhythm: Normal rate and regular rhythm.     Heart sounds: Normal heart sounds. No murmur heard.    No gallop.  Pulmonary:     Effort: Pulmonary effort is normal.     Breath sounds: Normal breath sounds.  Abdominal:  Palpations: Abdomen is soft. There is no hepatomegaly or splenomegaly.     Tenderness: There is no abdominal tenderness.     Hernia: There is no hernia in the left inguinal area.  Musculoskeletal:        General: Normal range of motion.     Cervical back: Normal range of motion and neck supple.  Lymphadenopathy:     Cervical: No cervical adenopathy.  Skin:    General: Skin is warm and dry.     Findings: No rash.  Neurological:     Mental Status: He is alert.  Psychiatric:        Mood and Affect: Mood is not anxious or depressed.     Wt Readings from Last 3 Encounters:  10/09/23 216 lb 6.4 oz (98.2 kg)  05/21/23 217 lb (98.4 kg)  04/19/23 219 lb (99.3 kg)    BP 120/80   Ht 6\' 3"  (1.905 m)   Wt 216 lb 6.4 oz (98.2 kg)   SpO2 99%   BMI 27.05 kg/m   Assessment and Plan:  1. Pulse irregularity (Primary) Chronic.  Controlled.  Relatively stable and that patient is asymptomatic.  Patient has reasons for this including PVCs and NSVT but there is some question of inability to locate a P wave and if there is atrial fibrillation involved.  We will bring this to the attention of cardiology and appointment has been requested patient needs and wants to be seen this Monday or 14 February or whenever they can reach an agreement of for her's appointment.  EKG was done today with results as follows pulse rate unable to determine due to to frequent PVCs.  Intervals QT is normal but PR is unable to be evaluated due to unable to detect P waves I am not sure even what is Score will affect putting him on anticoagulation because he is at significant risk for  falls because of his arm amputation and I think a stroke versus his heart is probably not going to be the main concern is that his cardiac is going to be the key determinant.  No ischemic changes as can tell but there is too many PVCs to to assess.  There is some question of anterolateral ischemia but I think the PVCs distort this interpretation as well as the a forementioned old anterior MI and that were not able to detect lack of R wave progression.  In essence we will refer to cardiology to see if we need to do anything else or whether or not he needs a defibrillator concerned internally discussed - EKG 12-Lead  2. Frequent PVCs As noted frequent PVCs have been noted in the past is probably secondary to his nonsustained NSVT as noted below but we will further evaluate for atrial fibrillation. - Ambulatory referral to Cardiology  3. NSVT (nonsustained ventricular tachycardia) (HCC) See above. - Ambulatory referral to Cardiology    Elizabeth Sauer, MD

## 2023-10-29 ENCOUNTER — Other Ambulatory Visit: Payer: Self-pay | Admitting: Family

## 2023-10-29 DIAGNOSIS — I5022 Chronic systolic (congestive) heart failure: Secondary | ICD-10-CM

## 2023-11-03 ENCOUNTER — Telehealth: Payer: Self-pay | Admitting: Family Medicine

## 2023-11-03 NOTE — Telephone Encounter (Signed)
Medication Refill -  Most Recent Primary Care Visit:  Provider: Duanne Limerick  Department: [name]  Visit Type: OFFICE VISIT  Date: 10/09/2023  Medication: atorvastatin (LIPITOR) 40 MG tablet  Pt informed insurance that he is supposed to take half a tablet. Insurance requesting this prescription be updated and sent in to pharmacy below.  Has the patient contacted their pharmacy? Yes  Is this the correct pharmacy for this prescription? Yes This is the patient's preferred pharmacy: [facility] - [place], [place] - [address] [place] [address] Phone: [phone] Fax: [phone]   Has the prescription been filled recently? No  Is the patient out of the medication? No  Has the patient been seen for an appointment in the last year OR does the patient have an upcoming appointment? Yes  Can we respond through MyChart? No  Agent: Please be advised that Rx refills may take up to 3 business days. We ask that you follow-up with your pharmacy.

## 2023-11-04 ENCOUNTER — Other Ambulatory Visit: Payer: Self-pay

## 2023-11-04 DIAGNOSIS — E7801 Familial hypercholesterolemia: Secondary | ICD-10-CM

## 2023-11-04 MED ORDER — ATORVASTATIN CALCIUM 40 MG PO TABS: 40.0000 mg | ORAL_TABLET | Freq: Every day | ORAL | 0 refills | Status: DC

## 2023-11-04 NOTE — Telephone Encounter (Signed)
 Refill sent.

## 2023-11-20 ENCOUNTER — Telehealth: Payer: Self-pay | Admitting: Family

## 2023-11-20 NOTE — Progress Notes (Deleted)
 Advanced Heart Failure Clinic Note    PCP: Elizabeth Sauer, MD (last seen 01/25) Primary Cardiologist: none  Chief Complaint:   HPI:  Mr Scott Meyer is a 73 y/o male with a history of HTN, previous tobacco use and chronic heart failure.   Admitted 11/18/22 due to SOB and increased pedal edema due to a/c HF exacerbation. Initially given IV lasix with transition to oral diuretics. Has had 2 recent urgent care visits due to swimmer's ear.    Echo 11/28/20: EF of 25-30% along with moderate LVH. Echo 11/19/22: EF 20-25% along with LVH along with Grade II DD and mild/moderate MR.    He presents today for a HF follow-up visit with a chief complaint of   He says that the ball in his chest has returned and he feels better and this occurred after the last full moon.     ROS: All systems negative except as listed in HPI, PMH and Problem List.  SH:  Social History   Socioeconomic History   Marital status: Single    Spouse name: Not on file   Number of children: Not on file   Years of education: Not on file   Highest education level: Not on file  Occupational History   Occupation: Retired  Tobacco Use   Smoking status: Former    Current packs/day: 0.00    Types: Cigarettes    Quit date: 10/09/2017    Years since quitting: 6.1   Smokeless tobacco: Never  Vaping Use   Vaping status: Never Used  Substance and Sexual Activity   Alcohol use: No    Comment: quit 8 months ago   Drug use: No   Sexual activity: Not on file  Other Topics Concern   Not on file  Social History Narrative   Lives in [place] w/ his sister.  Does not routinely exercise.   Social Drivers of Corporate investment banker Strain: Low Risk  (06/11/2023)   Overall Financial Resource Strain (CARDIA)    Difficulty of Paying Living Expenses: Not hard at all  Food Insecurity: No Food Insecurity (06/11/2023)   Hunger Vital Sign    Worried About Running Out of Food in the Last Year: Never true    Ran Out of Food in the Last  Year: Never true  Transportation Needs: No Transportation Needs (06/11/2023)   PRAPARE - Administrator, Civil Service (Medical): No    Lack of Transportation (Non-Medical): No  Physical Activity: Inactive (06/11/2023)   Exercise Vital Sign    Days of Exercise per Week: 0 days    Minutes of Exercise per Session: 0 min  Stress: No Stress Concern Present (06/11/2023)   [employer] of Occupational Health - Occupational Stress Questionnaire    Feeling of Stress : Not at all  Social Connections: Socially Isolated (06/11/2023)   Social Connection and Isolation Panel [NHANES]    Frequency of Communication with Friends and Family: Never    Frequency of Social Gatherings with Friends and Family: Never    Attends Religious Services: Never    Database administrator or Organizations: No    Attends Banker Meetings: Never    Marital Status: Never married  Intimate Partner Violence: Not At Risk (06/11/2023)   Humiliation, Afraid, Rape, and Kick questionnaire    Fear of Current or Ex-Partner: No    Emotionally Abused: No    Physically Abused: No    Sexually Abused: No    FH:  Family History  Problem Relation Age of Onset   Diabetes Mother    Cirrhosis Father    Alcohol abuse Father    Other Brother        gsw   Cancer Brother        'head full of tumors'   Other Brother        'killed w/ 2x4 to the head'   Other Sister        mva    Past Medical History:  Diagnosis Date   Cardiomyopathy (HCC)    a. 11/2020 Echo: EF 25-30%, mod LVH, Gr1 DD. Mild AoV Ca2+ w/ mild to mod AoV sclerosis.   CHF (congestive heart failure) (HCC)    Hypertension     Current Outpatient Medications  Medication Sig Dispense Refill   acetaminophen (TYLENOL) 500 MG tablet Take 500 mg by mouth every 6 (six) hours as needed for mild pain.     aspirin EC 81 MG tablet Take 1 tablet (81 mg total) by mouth daily. Swallow whole. 90 tablet 0   atorvastatin (LIPITOR) 40 MG tablet Take 1  tablet (40 mg total) by mouth daily. TAKE 1 TABLET(40 MG) BY MOUTH DAILY Strength: 40 mg 90 tablet 0   metoprolol succinate (TOPROL-XL) 25 MG 24 hr tablet Take 1 tablet (25 mg total) by mouth daily. 90 tablet 1   spironolactone (ALDACTONE) 25 MG tablet Take 1 tablet (25 mg total) by mouth daily. 90 tablet 1   torsemide (DEMADEX) 20 MG tablet Take 0.5 tablets (10 mg total) by mouth every other day. 45 tablet 3   No current facility-administered medications for this visit.     PHYSICAL EXAM:  General:  Well appearing. No resp difficulty HEENT: normal Neck: supple. JVP flat. No lymphadenopathy or thryomegaly appreciated. Cor: PMI normal. Regular rate & rhythm. No rubs, gallops or murmurs. Lungs: clear Abdomen: soft, nontender, nondistended. No hepatosplenomegaly. No bruits or masses. Good bowel sounds. Extremities: no cyanosis, clubbing, rash, 1+ pitting edema right lower leg, none left lower leg Neuro: alert & oriented x3, cranial nerves grossly intact. Moves all 4 extremities w/o difficulty. Affect pleasant.   ECG: none   ASSESSMENT & PLAN:   1: NICM with reduced ejection fraction- - likely due to HTN - NYHA class II - euvolemic today - says that he's weighing daily; reminded to call for an overnight weight gain of > 2 pounds or a weekly weight gain of > 5 pounds - weight 217 from last visit here 6 months ago - Echo 11/28/20: EF of 25-30% along with moderate LVH.  - Echo 11/19/22: EF 20-25% along with LVH along with Grade II DD and mild/moderate MR.  - not adding salt but "just a pinch"  - already rinses canned vegetables - GDMT is limited by patient's willingness to take more medications; would like to try him on SGLT but he's already complaining of frequent urination - continue metoprolol succinate 25mg  daily - continue spironolactone 25mg  daily - continue torsemide 10mg  every other day - BNP 11/18/22 was 1074.7  2: HTN- - BP - saw PCP Yetta Barre) 01/25 - BMP 02/13/23 reviewed  and showed sodium 139, potassium 3.7, creatinine 1.10 and GFR >60 - continue aspirin 81mg  daily - taking atorvastatin 20mg  daily (per patient choice)    Delma Freeze, FNP 11/20/23

## 2023-11-20 NOTE — Telephone Encounter (Signed)
 Vm box full unable to confirm appt for 11/21/23

## 2023-11-21 ENCOUNTER — Encounter: Payer: 59 | Admitting: Family

## 2023-11-21 NOTE — Telephone Encounter (Signed)
 Patient did not show for his Heart Failure Clinic appointment on 11/21/23.

## 2023-12-03 ENCOUNTER — Telehealth: Payer: Self-pay | Admitting: Family

## 2023-12-03 NOTE — Progress Notes (Unsigned)
 Advanced Heart Failure Clinic Note    PCP: Elizabeth Sauer, MD (last seen 01/25) Primary Cardiologist: none  Chief Complaint: shortness of breath  HPI:  Scott Meyer is a 73 y/o male with a history of HTN, frequent PVC's, NSVT, atrial tachycardia, right arm amputation, previous tobacco use and chronic heart failure.   Admitted 11/18/22 due to SOB and increased pedal edema due to a/c HF exacerbation. Initially given IV lasix with transition to oral diuretics. Has had 2 recent urgent care visits due to swimmer's ear.    Echo 11/28/20: EF of 25-30% along with moderate LVH. Echo 11/19/22: EF 20-25% along with LVH along with Grade II DD and mild/moderate Scott.    He presents today for a HF follow-up visit with a chief complaint of shortness of breath. Has associated fatigue, occasional chest pain, palpitations & being off balance along with this. Denies cough, abdominal distention, pedal edema, weight gain or difficulty sleeping.   He says that he will feel a sensation in the back of his neck, take the atorvastatin and then that sensation goes away. Taking atorvastatin inconsistently because it makes him feel unbalanced.   ROS: All systems negative except as listed in HPI, PMH and Problem List.  SH:  Social History   Socioeconomic History   Marital status: Single    Spouse name: Not on file   Number of children: Not on file   Years of education: Not on file   Highest education level: Not on file  Occupational History   Occupation: Retired  Tobacco Use   Smoking status: Former    Current packs/day: 0.00    Types: Cigarettes    Quit date: 10/09/2017    Years since quitting: 6.1   Smokeless tobacco: Never  Vaping Use   Vaping status: Never Used  Substance and Sexual Activity   Alcohol use: No    Comment: quit 8 months ago   Drug use: No   Sexual activity: Not on file  Other Topics Concern   Not on file  Social History Narrative   Lives in [place] w/ his sister.  Does not routinely  exercise.   Social Drivers of Corporate investment banker Strain: Low Risk  (06/11/2023)   Overall Financial Resource Strain (CARDIA)    Difficulty of Paying Living Expenses: Not hard at all  Food Insecurity: No Food Insecurity (06/11/2023)   Hunger Vital Sign    Worried About Running Out of Food in the Last Year: Never true    Ran Out of Food in the Last Year: Never true  Transportation Needs: No Transportation Needs (06/11/2023)   PRAPARE - Administrator, Civil Service (Medical): No    Lack of Transportation (Non-Medical): No  Physical Activity: Inactive (06/11/2023)   Exercise Vital Sign    Days of Exercise per Week: 0 days    Minutes of Exercise per Session: 0 min  Stress: No Stress Concern Present (06/11/2023)   [employer] of Occupational Health - Occupational Stress Questionnaire    Feeling of Stress : Not at all  Social Connections: Socially Isolated (06/11/2023)   Social Connection and Isolation Panel [NHANES]    Frequency of Communication with Friends and Family: Never    Frequency of Social Gatherings with Friends and Family: Never    Attends Religious Services: Never    Database administrator or Organizations: No    Attends Banker Meetings: Never    Marital Status: Never married  Intimate Partner Violence:  Not At Risk (06/11/2023)   Humiliation, Afraid, Rape, and Kick questionnaire    Fear of Current or Ex-Partner: No    Emotionally Abused: No    Physically Abused: No    Sexually Abused: No    FH:  Family History  Problem Relation Age of Onset   Diabetes Mother    Cirrhosis Father    Alcohol abuse Father    Other Brother        gsw   Cancer Brother        'head full of tumors'   Other Brother        'killed w/ 2x4 to the head'   Other Sister        mva    Past Medical History:  Diagnosis Date   Cardiomyopathy (HCC)    a. 11/2020 Echo: EF 25-30%, mod LVH, Gr1 DD. Mild AoV Ca2+ w/ mild to mod AoV sclerosis.   CHF (congestive  heart failure) (HCC)    Hypertension     Current Outpatient Medications  Medication Sig Dispense Refill   acetaminophen (TYLENOL) 500 MG tablet Take 500 mg by mouth every 6 (six) hours as needed for mild pain.     aspirin EC 81 MG tablet Take 1 tablet (81 mg total) by mouth daily. Swallow whole. 90 tablet 0   atorvastatin (LIPITOR) 40 MG tablet Take 1 tablet (40 mg total) by mouth daily. TAKE 1 TABLET(40 MG) BY MOUTH DAILY Strength: 40 mg 90 tablet 0   metoprolol succinate (TOPROL-XL) 25 MG 24 hr tablet Take 1 tablet (25 mg total) by mouth daily. 90 tablet 1   spironolactone (ALDACTONE) 25 MG tablet Take 1 tablet (25 mg total) by mouth daily. 90 tablet 1   torsemide (DEMADEX) 20 MG tablet Take 0.5 tablets (10 mg total) by mouth every other day. 45 tablet 3   No current facility-administered medications for this visit.   Vitals:   12/04/23 1001  BP: 138/73  Pulse: (!) 40  SpO2: 99%  Weight: 209 lb 4 oz (94.9 kg)   Wt Readings from Last 3 Encounters:  12/04/23 209 lb 4 oz (94.9 kg)  10/09/23 216 lb 6.4 oz (98.2 kg)  05/21/23 217 lb (98.4 kg)   Lab Results  Component Value Date   CREATININE 1.10 02/13/2023   CREATININE 1.31 (H) 11/28/2022   CREATININE 1.14 11/22/2022    PHYSICAL EXAM:  General: Well appearing. No resp difficulty HEENT: normal Neck: supple, no JVD Cor: Regular rhythm, bradycardic. No rubs, gallops or murmurs Lungs: clear Abdomen: soft, nontender, nondistended. Extremities: no cyanosis, clubbing, rash, 1+ pitting edema right lower leg, trace left lower leg Neuro: alert & oriented X 3. Moves all 4 extremities w/o difficulty. Affect pleasant   ECG: NSR with frequent PVC, LBBB, HR 70 (personally reviewed)   ASSESSMENT & PLAN:   1: NICM with reduced ejection fraction- - likely due to HTN bu has not had ischemic workup due to his refusal - NYHA class II - euvolemic today - says that he's weighing daily; reminded to call for an overnight weight gain of >  2 pounds or a weekly weight gain of > 5 pounds - weight down 8 pounds from last visit here 6 months ago - Echo 11/28/20: EF of 25-30% along with moderate LVH.  - Echo 11/19/22: EF 20-25% along with LVH along with Grade II DD and mild/moderate Scott.  - not adding salt but "just a pinch"  - already rinses canned vegetables - GDMT is limited  by patient's willingness to take more medications - continue metoprolol succinate 25mg  daily - continue spironolactone 25mg  daily - continue torsemide 10mg  every other day - discussed SGLT2 and he is agreeable but currently he's having quite a bit of urinary burning/ dysuria - BNP 11/18/22 was 1074.7  2: HTN- - BP 138/73 - saw PCP Yetta Barre) 01/25 - BMP 02/13/23 reviewed and showed sodium 139, potassium 3.7, creatinine 1.10 and GFR >60 - BMET/ lipid panel today - continue aspirin 81mg  daily - taking atorvastatin 20mg  ~ QOD (per patient choice)  3: PVC's- - EKG today is NSR wit PVC, LBBB - HR 40-70's - 14 zio monitor placed today to capture PVC burden   Return in 2 months, sooner depending on zio results. Call PCP regarding urinary symptoms  Delma Freeze, FNP 12/03/23

## 2023-12-03 NOTE — Telephone Encounter (Signed)
 Pt confirmed appt for 12/04/23.

## 2023-12-04 ENCOUNTER — Encounter: Payer: Self-pay | Admitting: Family

## 2023-12-04 ENCOUNTER — Ambulatory Visit: Attending: Family | Admitting: Family

## 2023-12-04 ENCOUNTER — Telehealth (HOSPITAL_COMMUNITY): Payer: Self-pay

## 2023-12-04 ENCOUNTER — Other Ambulatory Visit (HOSPITAL_COMMUNITY): Payer: Self-pay

## 2023-12-04 ENCOUNTER — Encounter (HOSPITAL_COMMUNITY): Payer: Self-pay | Admitting: Cardiology

## 2023-12-04 VITALS — BP 138/73 | HR 40 | Wt 209.2 lb

## 2023-12-04 DIAGNOSIS — Z89201 Acquired absence of right upper limb, unspecified level: Secondary | ICD-10-CM | POA: Diagnosis not present

## 2023-12-04 DIAGNOSIS — I428 Other cardiomyopathies: Secondary | ICD-10-CM | POA: Diagnosis not present

## 2023-12-04 DIAGNOSIS — I5022 Chronic systolic (congestive) heart failure: Secondary | ICD-10-CM | POA: Diagnosis not present

## 2023-12-04 DIAGNOSIS — R300 Dysuria: Secondary | ICD-10-CM | POA: Diagnosis not present

## 2023-12-04 DIAGNOSIS — I110 Hypertensive heart disease with heart failure: Secondary | ICD-10-CM | POA: Insufficient documentation

## 2023-12-04 DIAGNOSIS — Z79899 Other long term (current) drug therapy: Secondary | ICD-10-CM | POA: Insufficient documentation

## 2023-12-04 DIAGNOSIS — Z7982 Long term (current) use of aspirin: Secondary | ICD-10-CM | POA: Diagnosis not present

## 2023-12-04 DIAGNOSIS — I447 Left bundle-branch block, unspecified: Secondary | ICD-10-CM | POA: Insufficient documentation

## 2023-12-04 DIAGNOSIS — I10 Essential (primary) hypertension: Secondary | ICD-10-CM | POA: Diagnosis not present

## 2023-12-04 DIAGNOSIS — Z87891 Personal history of nicotine dependence: Secondary | ICD-10-CM | POA: Diagnosis not present

## 2023-12-04 DIAGNOSIS — I493 Ventricular premature depolarization: Secondary | ICD-10-CM | POA: Insufficient documentation

## 2023-12-04 NOTE — Progress Notes (Signed)
 [facility] - HEART FAILURE CLINIC - PHARMACIST COUNSELING NOTE  Guideline-Directed Medical Therapy/Evidence Based Medicine  ACE/ARB/ARNI: {AR_ARNI/ACEi/ARB:25599} Beta Blocker: {AR_Beta blocker:25598} Aldosterone Antagonist: {AR_Mineralocorticoid receptor antagonists:25602} Diuretic: {AR_Diuretics:25604} SGLT2i: {AR_SGLT2i:25603}  Adherence Assessment  Do you ever forget to take your medication? [x] Yes [] No  Do you ever skip doses due to side effects? [] Yes [] No  Do you have trouble affording your medicines? [] Yes [x] No  Are you ever unable to pick up your medication due to transportation difficulties? [] Yes [x] No  Do you ever stop taking your medications because you don't believe they are helping? [] Yes [x] No  Do you check your weight daily? [] Yes [x] No   Adherence strategy: Pill box at home but doesn't use  Barriers to obtaining medications: ***  Vital signs: HR ***, BP ***, weight (pounds) *** ECHO: Date ***, EF ***, notes *** Cath: Date ***, EF ***, notes ***     Latest Ref Rng & Units 02/13/2023   10:54 AM 11/28/2022    3:44 PM 11/22/2022    5:19 AM  BMP  Glucose 70 - 99 mg/dL 85  89  96   BUN 8 - 23 mg/dL 22  24  19    Creatinine 0.61 - 1.24 mg/dL 1.61  0.96  0.45   BUN/Creat Ratio 10 - 24  18    Sodium 135 - 145 mmol/L 139  138  134   Potassium 3.5 - 5.1 mmol/L 3.7  4.5  3.5   Chloride 98 - 111 mmol/L 108  101  102   CO2 22 - 32 mmol/L 24  23  25    Calcium 8.9 - 10.3 mg/dL 9.1  9.6  8.6    Past Medical History:  Diagnosis Date   Cardiomyopathy (HCC)    a. 11/2020 Echo: EF 25-30%, mod LVH, Gr1 DD. Mild AoV Ca2+ w/ mild to mod AoV sclerosis.   CHF (congestive heart failure) (HCC)    Hypertension    ASSESSMENT *** year old {Gender Description:[id number]} who presents to the HF clinic ***  Recent ED Visit (past 6 months): Date - ***, CC - ***  COUNSELING POINTS/CLINICAL PEARLS    PLAN    Current Outpatient Medications:     acetaminophen (TYLENOL) 500 MG tablet, Take 500 mg by mouth every 6 (six) hours as needed for mild pain., Disp: , Rfl:    aspirin EC 81 MG tablet, Take 1 tablet (81 mg total) by mouth daily. Swallow whole., Disp: 90 tablet, Rfl: 0   atorvastatin (LIPITOR) 40 MG tablet, Take 1 tablet (40 mg total) by mouth daily. TAKE 1 TABLET(40 MG) BY MOUTH DAILY Strength: 40 mg, Disp: 90 tablet, Rfl: 0   metoprolol succinate (TOPROL-XL) 25 MG 24 hr tablet, Take 1 tablet (25 mg total) by mouth daily., Disp: 90 tablet, Rfl: 1   spironolactone (ALDACTONE) 25 MG tablet, Take 1 tablet (25 mg total) by mouth daily., Disp: 90 tablet, Rfl: 1   torsemide (DEMADEX) 20 MG tablet, Take 0.5 tablets (10 mg total) by mouth every other day., Disp: 45 tablet, Rfl: 3  Time spent: *** minutes  Littie Deeds, PharmD Pharmacy Resident  12/04/2023 8:28 AM

## 2023-12-04 NOTE — Patient Instructions (Addendum)
 Medication Changes:  No medication changes  Lab Work:  Go DOWN to LOWER LEVEL (LL) to have your blood work completed inside of [employer] office.  We will only call you if the results are abnormal or if the provider would like to make medication changes.   Special Instructions // Education:  Your provider has recommended that  you wear a Zio Patch for 14 days.  This monitor will record your heart rhythm for our review.  IF you have any symptoms while wearing the monitor please press the button.  If you have any issues with the patch or you notice a red or orange light on it please call the company at [phone].  Once you remove the patch please mail it back to the company as soon as possible so we can get the results.  Follow-Up in: 2 months with Clarisa Kindred, FNP.  At the [facility], you and your health needs are our priority. We have a designated team specialized in the treatment of Heart Failure. This Care Team includes your primary Heart Failure Specialized Cardiologist (physician), Advanced Practice Providers (APPs- Physician Assistants and Nurse Practitioners), and Pharmacist who all work together to provide you with the care you need, when you need it.   You may see any of the following providers on your designated Care Team at your next follow up:  Dr. Arvilla Meres Dr. Marca Ancona Dr. Dorthula Nettles Dr. Theresia Bough Clarisa Kindred, FNP Enos Fling, RPH-CPP  Please be sure to bring in all your medications bottles to every appointment.   Need to Contact us:  If you have any questions or concerns before your next appointment please send [place] a message through [place] or call our office at [phone].    TO LEAVE A MESSAGE FOR THE NURSE SELECT OPTION 2, PLEASE LEAVE A MESSAGE INCLUDING: YOUR NAME DATE OF BIRTH CALL BACK NUMBER REASON FOR CALL**this is important as we prioritize the call backs  YOU WILL RECEIVE A CALL BACK THE SAME DAY AS LONG AS  YOU CALL BEFORE 4:00 PM

## 2023-12-04 NOTE — Telephone Encounter (Signed)
 Advanced Heart Failure Patient Advocate Encounter  Test billing for Scott Meyer and Scott Meyer both return copay of $0 for 90 days on current coverage.  Burnell Blanks, CPhT Rx Patient Advocate Phone: [phone]

## 2023-12-05 ENCOUNTER — Encounter: Admitting: Family

## 2023-12-05 LAB — BASIC METABOLIC PANEL
BUN/Creatinine Ratio: 19 (ref 10–24)
BUN: 25 mg/dL (ref 8–27)
CO2: 21 mmol/L (ref 20–29)
Calcium: 9.7 mg/dL (ref 8.6–10.2)
Chloride: 105 mmol/L (ref 96–106)
Creatinine, Ser: 1.31 mg/dL — ABNORMAL HIGH (ref 0.76–1.27)
Glucose: 81 mg/dL (ref 70–99)
Potassium: 4.6 mmol/L (ref 3.5–5.2)
Sodium: 140 mmol/L (ref 134–144)
eGFR: 57 mL/min/{1.73_m2} — ABNORMAL LOW (ref >59)

## 2023-12-05 LAB — LIPID PANEL
Chol/HDL Ratio: 3.1 ratio (ref 0.0–5.0)
Cholesterol, Total: 129 mg/dL (ref 100–199)
HDL: 42 mg/dL (ref >39)
LDL Chol Calc (NIH): 76 mg/dL (ref 0–99)
Triglycerides: 47 mg/dL (ref 0–149)
VLDL Cholesterol Cal: 11 mg/dL (ref 5–40)

## 2023-12-25 ENCOUNTER — Other Ambulatory Visit: Payer: Self-pay

## 2023-12-25 DIAGNOSIS — I420 Dilated cardiomyopathy: Secondary | ICD-10-CM

## 2023-12-25 DIAGNOSIS — I10 Essential (primary) hypertension: Secondary | ICD-10-CM

## 2023-12-25 MED ORDER — SPIRONOLACTONE 25 MG PO TABS: 25.0000 mg | ORAL_TABLET | Freq: Every day | ORAL | 0 refills | Status: DC

## 2024-01-15 ENCOUNTER — Telehealth: Payer: Self-pay

## 2024-01-15 ENCOUNTER — Other Ambulatory Visit: Payer: Self-pay

## 2024-01-15 DIAGNOSIS — I10 Essential (primary) hypertension: Secondary | ICD-10-CM

## 2024-01-15 DIAGNOSIS — I420 Dilated cardiomyopathy: Secondary | ICD-10-CM

## 2024-01-15 NOTE — Telephone Encounter (Signed)
 Left message to call the office fpr Bp

## 2024-01-15 NOTE — Telephone Encounter (Signed)
 Please call pt to schedule an appointment for BP.  KP

## 2024-01-20 ENCOUNTER — Other Ambulatory Visit: Payer: Self-pay

## 2024-01-20 DIAGNOSIS — I10 Essential (primary) hypertension: Secondary | ICD-10-CM

## 2024-01-20 DIAGNOSIS — I420 Dilated cardiomyopathy: Secondary | ICD-10-CM

## 2024-01-20 MED ORDER — METOPROLOL SUCCINATE ER 25 MG PO TB24: 25.0000 mg | ORAL_TABLET | Freq: Every day | ORAL | 0 refills | Status: DC

## 2024-02-04 ENCOUNTER — Telehealth: Payer: Self-pay | Admitting: Family

## 2024-02-04 NOTE — Telephone Encounter (Signed)
 Called to confirm/remind patient of their appointment at the [facility] on 02/05/24.   Appointment:   [x] Confirmed  [] Left mess   [] No answer/No voice mail  [] VM Full/unable to leave message  [] Phone not in service  Patient reminded to bring all medications and/or complete list.  Confirmed patient has transportation. Gave directions, instructed to utilize valet parking.

## 2024-02-04 NOTE — Progress Notes (Signed)
 [facility] Note    PCP: Alayne Allis, MD (last seen 01/25) Primary Cardiologist: none  Chief Complaint: shortness of breath   HPI:  Scott Meyer is a 73 y/o male with a history of HTN, frequent PVC's, NSVT, atrial tachycardia, right arm amputation, previous tobacco use and chronic heart failure.   Echo 11/28/20: EF of 25-30% along with moderate LVH.  Admitted 11/18/22 due to SOB and increased pedal edema due to a/c HF exacerbation. Initially given IV lasix  with transition to oral diuretics. Echo 11/19/22: EF 20-25% along with LVH along with Grade II DD and mild/moderate Scott.  Has had 2 recent urgent care visits due to swimmer's ear.    Seen in [facility] 03/25 and zio monitor placed. LDL was 76 but unable to reach patient to increase atorvastatin .   He presents today for a HF follow-up visit with a chief complaint of shortness of breath. Has associated fatigue, pedal edema, occasional chest pain, palpitations. Denies dizziness or abdominal distention. Feels "triple, double heart beat" at times. Turned his zio monitor in early April. Had stopped taking his torsemide  because he was voiding so much. He resumed it after his shortness of breath worsened.   Mentions not wanting surgery until the next full moon. Wants to get off medications and heal himself by supernatural means.   ROS: All systems negative except as listed in HPI, PMH and Problem List.  SH:  Social History   Socioeconomic History   Marital status: Single    Spouse name: Not on file   Number of children: Not on file   Years of education: Not on file   Highest education level: Not on file  Occupational History   Occupation: Retired  Tobacco Use   Smoking status: Former    Current packs/day: 0.00    Types: Cigarettes    Quit date: 10/09/2017    Years since quitting: 6.3   Smokeless tobacco: Never  Vaping Use   Vaping status: Never Used  Substance and Sexual Activity   Alcohol use: No    Comment: quit 8  months ago   Drug use: No   Sexual activity: Not on file  Other Topics Concern   Not on file  Social History Narrative   Lives in [place] w/ his sister.  Does not routinely exercise.   Social Drivers of Corporate investment banker Strain: Low Risk  (06/11/2023)   Overall Financial Resource Strain (CARDIA)    Difficulty of Paying Living Expenses: Not hard at all  Food Insecurity: No Food Insecurity (06/11/2023)   Hunger Vital Sign    Worried About Running Out of Food in the Last Year: Never true    Ran Out of Food in the Last Year: Never true  Transportation Needs: No Transportation Needs (06/11/2023)   PRAPARE - Administrator, Civil Service (Medical): No    Lack of Transportation (Non-Medical): No  Physical Activity: Inactive (06/11/2023)   Exercise Vital Sign    Days of Exercise per Week: 0 days    Minutes of Exercise per Session: 0 min  Stress: No Stress Concern Present (06/11/2023)   [employer] of Occupational Health - Occupational Stress Questionnaire    Feeling of Stress : Not at all  Social Connections: Socially Isolated (06/11/2023)   Social Connection and Isolation Panel [NHANES]    Frequency of Communication with Friends and Family: Never    Frequency of Social Gatherings with Friends and Family: Never    Attends Religious  Services: Never    Active Member of Clubs or Organizations: No    Attends Banker Meetings: Never    Marital Status: Never married  Intimate Partner Violence: Not At Risk (06/11/2023)   Humiliation, Afraid, Rape, and Kick questionnaire    Fear of Current or Ex-Partner: No    Emotionally Abused: No    Physically Abused: No    Sexually Abused: No    FH:  Family History  Problem Relation Age of Onset   Diabetes Mother    Cirrhosis Father    Alcohol abuse Father    Other Brother        gsw   Cancer Brother        'head full of tumors'   Other Brother        'killed w/ 2x4 to the head'   Other Sister        mva     Past Medical History:  Diagnosis Date   Cardiomyopathy (HCC)    a. 11/2020 Echo: EF 25-30%, mod LVH, Gr1 DD. Mild AoV Ca2+ w/ mild to mod AoV sclerosis.   CHF (congestive heart failure) (HCC)    Hypertension     Current Outpatient Medications  Medication Sig Dispense Refill   acetaminophen  (TYLENOL ) 500 MG tablet Take 500 mg by mouth every 6 (six) hours as needed for mild pain.     aspirin  EC 81 MG tablet Take 1 tablet (81 mg total) by mouth daily. Swallow whole. 90 tablet 0   atorvastatin  (LIPITOR) 40 MG tablet Take 1 tablet (40 mg total) by mouth daily. TAKE 1 TABLET(40 MG) BY MOUTH DAILY Strength: 40 mg 90 tablet 0   metoprolol  succinate (TOPROL -XL) 25 MG 24 hr tablet Take 1 tablet (25 mg total) by mouth daily. 30 tablet 0   spironolactone  (ALDACTONE ) 25 MG tablet Take 1 tablet (25 mg total) by mouth daily. 90 tablet 0   torsemide  (DEMADEX ) 20 MG tablet Take 0.5 tablets (10 mg total) by mouth every other day. 45 tablet 3   No current facility-administered medications for this visit.   Vitals:   02/05/24 0938  BP: (!) 150/62  Pulse: (!) 43  SpO2: 99%  Weight: 203 lb (92.1 kg)   Wt Readings from Last 3 Encounters:  02/05/24 203 lb (92.1 kg)  12/04/23 209 lb 4 oz (94.9 kg)  10/09/23 216 lb 6.4 oz (98.2 kg)   Lab Results  Component Value Date   CREATININE 1.31 (H) 12/04/2023   CREATININE 1.10 02/13/2023   CREATININE 1.31 (H) 11/28/2022    PHYSICAL EXAM:  General: Well appearing. No resp difficulty HEENT: normal Neck: supple, no JVD Cor: Regular rhythm, bradycardic. No rubs, gallops or murmurs Lungs: clear Abdomen: soft, nontender, nondistended. Extremities: no cyanosis, clubbing, rash, 1+ pitting edema right lower leg, trace pitting left lower leg Neuro: alert & oriented X 3. Moves all 4 extremities w/o difficulty. Partial loss of right arm. Affect pleasant    ECG: not done   ASSESSMENT & PLAN:   1: NICM with reduced ejection fraction- - likely due to HTN  bu has not had ischemic workup due to his refusal - NYHA class II - euvolemic today - says that he's weighing daily; reminded to call for an overnight weight gain of > 2 pounds or a weekly weight gain of > 5 pounds - weight down 6 pounds from last visit here 2 months ago - Echo 11/28/20: EF of 25-30% along with moderate LVH.  - Echo 11/19/22:  EF 20-25% along with LVH along with Grade II DD and mild/moderate Scott.  - rinses canned vegetables - GDMT is limited by patient's willingness to take more medications - decrease metoprolol  succinate to 12.5mg  daily due to bradycardia - continue spironolactone  25mg  daily - continue torsemide  10mg  every other day - discussed SGLT2 and he is agreeable but he continues to have quite a bit of urinary burning/ dysuria - encouraged to drink a small glass of cranberry juice in the mornings and f/u with PCP about urinary issues - BNP 11/18/22 was 1074.7  2: HTN- - BP 150/62; has not taken his medications yet today - saw PCP Rochelle Chu) 01/25 - BMP 12/04/23 reviewed: sodium 140, potassium 4.6, creatinine 1.31 and GFR 57 - continue aspirin  81mg  daily  3: PVC's- - HR 43 today and he hasn't taken his meds yet today - decrease metoprolol  succinate to 12.5mg  daily - zio monitor placed at last visit to capture PVC burden but it appears the monitor was not registered; staff able to get it linked today so waiting on results  4: Hyperlipidemia- - taking atorvastatin  inconsistently but he is agreeable to taking 40mg  three nights/ week - LDL 12/04/23 was 76   Return in 4 months, sooner if needed.   Charlette Console, FNP 02/04/24

## 2024-02-05 ENCOUNTER — Inpatient Hospital Stay (HOSPITAL_COMMUNITY)
Admission: RE | Admit: 2024-02-05 | Discharge: 2024-02-05 | Disposition: A | Discharge status: Home / Self Care | Source: Ambulatory Visit | Attending: Internal Medicine | Admitting: Internal Medicine

## 2024-02-05 ENCOUNTER — Ambulatory Visit: Payer: Self-pay | Admitting: Family

## 2024-02-05 ENCOUNTER — Other Ambulatory Visit (HOSPITAL_COMMUNITY): Payer: Self-pay | Admitting: Internal Medicine

## 2024-02-05 ENCOUNTER — Encounter: Payer: Self-pay | Admitting: Family

## 2024-02-05 ENCOUNTER — Ambulatory Visit: Attending: Family | Admitting: Family

## 2024-02-05 VITALS — BP 150/62 | HR 43 | Wt 203.0 lb

## 2024-02-05 DIAGNOSIS — R0602 Shortness of breath: Secondary | ICD-10-CM | POA: Diagnosis present

## 2024-02-05 DIAGNOSIS — I4729 Other ventricular tachycardia: Secondary | ICD-10-CM | POA: Diagnosis not present

## 2024-02-05 DIAGNOSIS — I10 Essential (primary) hypertension: Secondary | ICD-10-CM | POA: Diagnosis not present

## 2024-02-05 DIAGNOSIS — I110 Hypertensive heart disease with heart failure: Secondary | ICD-10-CM | POA: Insufficient documentation

## 2024-02-05 DIAGNOSIS — E785 Hyperlipidemia, unspecified: Secondary | ICD-10-CM | POA: Insufficient documentation

## 2024-02-05 DIAGNOSIS — I428 Other cardiomyopathies: Secondary | ICD-10-CM | POA: Insufficient documentation

## 2024-02-05 DIAGNOSIS — Z91128 Patient's intentional underdosing of medication regimen for other reason: Secondary | ICD-10-CM | POA: Diagnosis not present

## 2024-02-05 DIAGNOSIS — Z79899 Other long term (current) drug therapy: Secondary | ICD-10-CM | POA: Diagnosis not present

## 2024-02-05 DIAGNOSIS — E782 Mixed hyperlipidemia: Secondary | ICD-10-CM | POA: Diagnosis not present

## 2024-02-05 DIAGNOSIS — I493 Ventricular premature depolarization: Secondary | ICD-10-CM

## 2024-02-05 DIAGNOSIS — R300 Dysuria: Secondary | ICD-10-CM | POA: Diagnosis not present

## 2024-02-05 DIAGNOSIS — Z89201 Acquired absence of right upper limb, unspecified level: Secondary | ICD-10-CM | POA: Diagnosis not present

## 2024-02-05 DIAGNOSIS — T466X6A Underdosing of antihyperlipidemic and antiarteriosclerotic drugs, initial encounter: Secondary | ICD-10-CM | POA: Insufficient documentation

## 2024-02-05 DIAGNOSIS — I420 Dilated cardiomyopathy: Secondary | ICD-10-CM

## 2024-02-05 DIAGNOSIS — Z87891 Personal history of nicotine dependence: Secondary | ICD-10-CM | POA: Insufficient documentation

## 2024-02-05 DIAGNOSIS — I5022 Chronic systolic (congestive) heart failure: Secondary | ICD-10-CM | POA: Insufficient documentation

## 2024-02-05 MED ORDER — METOPROLOL SUCCINATE ER 25 MG PO TB24: 12.5000 mg | ORAL_TABLET | Freq: Every day | ORAL | Status: DC

## 2024-02-05 NOTE — Progress Notes (Signed)
 [facility] - HEART FAILURE CLINIC - PHARMACIST COUNSELING NOTE  Scott Meyer is a 73 y.o. year old male who presents to the HF clinic for their 2 month follow-up appointment. They have a past medical history significant for HTN, frequent PVC's, NSVT, atrial tachycardia, right arm amputation, previous tobacco use and chronic heart failure. Initially, they were diagnosed with HFrEF possibly secondary to HTN but refuses to do an ischemic work up with an EF of 20-30%. At their last appointment on 12/04/2023, there were no changes to their medications. They are not on complete GDMT due to resistance to take more medications at this time. They also have poor adherence. However, they were agreeable to possibly start an SGLT-2 in the future, but reported having urinary burning/dysuria.    Current Guideline-Directed Medical Therapy (GDMT)  ACE/ARB/ARNI: N/A Beta Blocker: Metoprolol  succinate 25 mg daily Aldosterone Antagonist: Spironolactone  25 mg daily Diuretic: Torsemide  10 mg every other day SGLT2i: NA  Vital Signs and Trends  Recent Trends BP Readings from Last 3 Encounters:  12/04/23 138/73  10/09/23 120/80  05/21/23 (!) 140/90   Wt Readings from Last 3 Encounters:  12/04/23 209 lb 4 oz (94.9 kg)  10/09/23 216 lb 6.4 oz (98.2 kg)  05/21/23 217 lb (98.4 kg)   Today's visit HR 43 BP 150/62 Weight (pounds) 203 lbs  Cardiac Imaging  - Echo 11/28/20: EF of 25-30% along with moderate LVH.  - Echo 11/19/22: EF 20-25% along with LVH along with Grade II DD and mild/moderate MR.  Changes Since Last Visit   Hospitalizations/ED visits (last 6 months): NA Medication changes: NA  Pertinent Labs  Lab Results  Component Value Date   NA 140 12/04/2023   CL 105 12/04/2023   K 4.6 12/04/2023   CO2 21 12/04/2023   BUN 25 12/04/2023   CREATININE 1.31 (H) 12/04/2023   EGFR 57 (L) 12/04/2023   CALCIUM  9.7 12/04/2023   PHOS 2.7 06/13/2021   ALBUMIN 3.1 (L) 11/19/2022    GLUCOSE 81 12/04/2023   Lab Results  Component Value Date   LDLCALC 76 12/04/2023   Lab Results  Component Value Date   HGBA1C 5.5 11/19/2022    ASSESSMENT  Reason for today's visit   To assess current guideline directed medical therapy. This is a routine follow-up. When asked about their medications, they expressed that they are currently waiting for the supernatural forces to kick in. However, the correct full moon for this won't occur until August. It's not until this point that they will know whether or not the supernatural forces will help heal them, but it a chance to cure them before making any serious medical decisions. Current diet is orange juice, boiled cabbage and boiled chicken, but they said their diet isn't great right now. They report still experiencing burning when they urinate. Suggested they try a little glass of cranberry juice with breakfast to see if this helps. Also reported they only take half a atorvastatin  at a time every so often. Was able to talk to the patient about the benefits of the statin. They agreed to take the full tablet (40 mg) three times per week instead of every so often at night to see how that makes them feel. They report their body tells them when they need to take their statin because otherwise it makes them feel off.   Guideline-Directed Medical Therapy Evaluation  ACEi/ARB/ARNi Patient is not on therapy because according to patient they were unable to tolerate Entresto  and lisinopril   in the past   Beta-Blocker  Target Achieved [] Yes [x] No Up-titration candidate today [] Yes [x] No  MRA Target Achieved [x] Yes [] No Up-titration candidate today [] Yes [x] No  SGLT-2 Patient is not on therapy because at last appointment they reported consistent burning with urination daily  Copay $0.00 for 90 days for either Farxiga  or Jardiance   Loop  Increase dose today  [] Yes [x] No  Adherence strategy: In a pill bag Barriers to obtaining medications:  None identified  Did you take your medications before your appointment today: [] Yes [x] No  Preventative Care   Parameter Most Recent Result Goal/Status Current Medications  LDL 76 < 70 - 55 mg/dL Just above goal Statin: Atorvastatin  40 mg daily, but doesn't take consistently   A1c 5.5  7 - 8 %  NA  BP Control 150/62 < 130/80  NA  ECHO 20-25% 6 - 12 months Last ECHO 11/19/2022   PLAN  Medication Interventions:  -- Consider the addition of an SGLT-2 in the future if they are willing/the burning with their urination resolves -- Continue current regimen per NP Preventative Care Follow-up:  -- Could consider Zetia 10 mg daily since patient is close to goal and isn't tolerating atorvastatin  three times per week well  Lab or imaging orders:  -- Annual ECHO up to date -- BMET today   Time spent: 15 minutes  Thank you for allowing pharmacy to participate in this patient's care.   Nachelle Negrette K Zenab Gronewold, Pharm.D. Pharmacy Resident 02/05/2024 7:45 AM

## 2024-02-05 NOTE — Patient Instructions (Signed)
 Great to see you today!!!  Medication Changes:  DECREASE Metoprolol  XL to 12.5 mg (1/2 tab) Daily  Special Instructions // Education:  Do the following things EVERYDAY: Weigh yourself in the morning before breakfast. Write it down and keep it in a log. Take your medicines as prescribed Eat low salt foods--Limit salt (sodium) to 2000 mg per day.  Stay as active as you can everyday Limit all fluids for the day to less than 2 liters   Follow-Up in: 4 months    If you have any questions or concerns before your next appointment please send us  a message through [place] or call our office at [phone], If it is after office hours your call will be answered by our answering service and directed appropriately.     At the [facility], you and your health needs are our priority. We have a designated team specialized in the treatment of Heart Failure. This Care Team includes your primary Heart Failure Specialized Cardiologist (physician), Advanced Practice Providers (APPs- Physician Assistants and Nurse Practitioners), and Pharmacist who all work together to provide you with the care you need, when you need it.   You may see any of the following providers on your designated Care Team at your next follow up:  Dr. Jules Oar Dr. Peder Bourdon Dr. Alwin Baars Dr. Judyth Nunnery Nieves Bars, NP Ruddy Corral, [place] [address] [place], [place] Dennise Fitz, NP [place] Lee, NP [place] Dellen, NP Bevely Brush, PharmD

## 2024-02-06 ENCOUNTER — Other Ambulatory Visit: Payer: Self-pay

## 2024-02-06 ENCOUNTER — Telehealth: Payer: Self-pay | Admitting: Family

## 2024-02-06 DIAGNOSIS — I472 Ventricular tachycardia, unspecified: Secondary | ICD-10-CM

## 2024-02-06 DIAGNOSIS — I4729 Other ventricular tachycardia: Secondary | ICD-10-CM

## 2024-02-06 NOTE — Telephone Encounter (Signed)
 Received zio results after patient left the office. Will make EP referral due to HR ranging from 42-222 and having numerous runs of VT and NSVT.

## 2024-02-06 NOTE — Progress Notes (Signed)
 Message from [employer] [place] [employer], FNP:  "He needs EP referral for zio results. HR ranged from 42-222 with numerous runs of VT and NSVT. Thanks"

## 2024-02-10 ENCOUNTER — Other Ambulatory Visit (HOSPITAL_COMMUNITY): Payer: Self-pay | Admitting: Surgery

## 2024-02-19 ENCOUNTER — Ambulatory Visit (HOSPITAL_COMMUNITY): Payer: Self-pay | Admitting: Internal Medicine

## 2024-02-20 NOTE — Progress Notes (Unsigned)
 Patient placed on recall for ofv with Dr. Mitzie Anda per Dr. Julane Ny.

## 2024-03-01 ENCOUNTER — Other Ambulatory Visit: Payer: Self-pay

## 2024-03-01 ENCOUNTER — Other Ambulatory Visit: Payer: Self-pay | Admitting: Family Medicine

## 2024-03-01 DIAGNOSIS — E7801 Familial hypercholesterolemia: Secondary | ICD-10-CM

## 2024-03-01 DIAGNOSIS — I10 Essential (primary) hypertension: Secondary | ICD-10-CM

## 2024-03-01 DIAGNOSIS — I420 Dilated cardiomyopathy: Secondary | ICD-10-CM

## 2024-03-01 MED ORDER — SPIRONOLACTONE 25 MG PO TABS: 25.0000 mg | ORAL_TABLET | Freq: Every day | ORAL | 0 refills | Status: DC

## 2024-03-01 MED ORDER — ATORVASTATIN CALCIUM 40 MG PO TABS: 40.0000 mg | ORAL_TABLET | Freq: Every day | ORAL | 0 refills | Status: DC

## 2024-03-01 MED ORDER — METOPROLOL SUCCINATE ER 25 MG PO TB24: 12.5000 mg | ORAL_TABLET | Freq: Every day | ORAL | 0 refills | Status: DC

## 2024-03-01 NOTE — Telephone Encounter (Signed)
 Requested medication (s) are due for refill today: no  Requested medication (s) are on the active medication list: yes  Last refill:  03/01/24  Future visit scheduled: no  Notes to clinic:  Unable to refill per protocol, last refill by another provider.      Requested Prescriptions  Pending Prescriptions Disp Refills   atorvastatin  (LIPITOR) 40 MG tablet [Pharmacy Med Name: ATORVASTATIN  40MG  TABLETS] 90 tablet     Sig: TAKE 1 TABLET BY MOUTH ONCE DAILY.     Cardiovascular:  Antilipid - Statins Failed - 03/01/2024  1:47 PM      Failed - Valid encounter within last 12 months    Recent Outpatient Visits   None            Failed - Lipid Panel in normal range within the last 12 months    Cholesterol, Total  Date Value Ref Range Status  12/04/2023 129 100 - 199 mg/dL Final   LDL Chol Calc (NIH)  Date Value Ref Range Status  12/04/2023 76 0 - 99 mg/dL Final   HDL  Date Value Ref Range Status  12/04/2023 42 >39 mg/dL Final   Triglycerides  Date Value Ref Range Status  12/04/2023 47 0 - 149 mg/dL Final         Passed - Patient is not pregnant

## 2024-03-02 ENCOUNTER — Other Ambulatory Visit: Payer: Self-pay | Admitting: Family Medicine

## 2024-03-02 DIAGNOSIS — I10 Essential (primary) hypertension: Secondary | ICD-10-CM

## 2024-03-02 DIAGNOSIS — I420 Dilated cardiomyopathy: Secondary | ICD-10-CM

## 2024-03-04 NOTE — Telephone Encounter (Signed)
 Requested medications are due for refill today.  no  Requested medications are on the active medications list.  yes  Last refill. 03/01/2024 #30 0 rf  Future visit scheduled.   no  Notes to clinic.  Rx signed by Dr. Norma Beckers. Pcp is Dr. Rochelle Chu    Requested Prescriptions  Pending Prescriptions Disp Refills   spironolactone  (ALDACTONE ) 25 MG tablet [Pharmacy Med Name: SPIRONOLACTONE  25MG  TABLETS] 90 tablet     Sig: TAKE 1 TABLET(25 MG) BY MOUTH DAILY     Cardiovascular: Diuretics - Aldosterone Antagonist Failed - 03/04/2024 11:19 AM      Failed - Cr in normal range and within 180 days    Creatinine, Ser  Date Value Ref Range Status  12/04/2023 1.31 (H) 0.76 - 1.27 mg/dL Final         Failed - Last BP in normal range    BP Readings from Last 1 Encounters:  02/05/24 (!) 150/62         Failed - Valid encounter within last 6 months    Recent Outpatient Visits   None            Passed - K in normal range and within 180 days    Potassium  Date Value Ref Range Status  12/04/2023 4.6 3.5 - 5.2 mmol/L Final         Passed - Na in normal range and within 180 days    Sodium  Date Value Ref Range Status  12/04/2023 140 134 - 144 mmol/L Final         Passed - eGFR is 30 or above and within 180 days    GFR calc Af Amer  Date Value Ref Range Status  06/16/2019 69 >59 mL/min/1.73 Final   GFR, Estimated  Date Value Ref Range Status  02/13/2023 >60 >60 mL/min Final    Comment:    (NOTE) Calculated using the CKD-EPI Creatinine Equation (2021)    eGFR  Date Value Ref Range Status  12/04/2023 57 (L) >59 mL/min/1.73 Final

## 2024-03-18 ENCOUNTER — Other Ambulatory Visit: Payer: Self-pay | Admitting: Family

## 2024-03-18 DIAGNOSIS — I5022 Chronic systolic (congestive) heart failure: Secondary | ICD-10-CM

## 2024-04-07 ENCOUNTER — Telehealth: Payer: Self-pay | Admitting: Family

## 2024-04-07 NOTE — Telephone Encounter (Signed)
 Called to confirm/remind patient of their appointment at the [facility] on 04/08/24.   Appointment:   [] Confirmed  [] Left mess   [] No answer/No voice mail  [x] VM Full/unable to leave message  [] Phone not in service  Patient reminded to bring all medications and/or complete list.  Confirmed patient has transportation. Gave directions, instructed to utilize valet parking.

## 2024-04-08 ENCOUNTER — Ambulatory Visit: Admitting: Cardiology

## 2024-04-08 ENCOUNTER — Other Ambulatory Visit
Admission: RE | Admit: 2024-04-08 | Discharge: 2024-04-08 | Disposition: A | Discharge status: Home / Self Care | Source: Ambulatory Visit | Attending: Cardiology | Admitting: Cardiology

## 2024-04-08 ENCOUNTER — Encounter: Payer: Self-pay | Admitting: Cardiology

## 2024-04-08 ENCOUNTER — Ambulatory Visit (HOSPITAL_COMMUNITY): Payer: Self-pay | Admitting: Cardiology

## 2024-04-08 VITALS — BP 133/79 | HR 69 | Wt 197.0 lb

## 2024-04-08 DIAGNOSIS — I493 Ventricular premature depolarization: Secondary | ICD-10-CM

## 2024-04-08 DIAGNOSIS — I5022 Chronic systolic (congestive) heart failure: Secondary | ICD-10-CM

## 2024-04-08 LAB — COMPREHENSIVE METABOLIC PANEL WITH GFR
ALT: 23 U/L (ref 0–44)
AST: 34 U/L (ref 15–41)
Albumin: 4.1 g/dL (ref 3.5–5.0)
Alkaline Phosphatase: 85 U/L (ref 38–126)
Anion gap: 9 (ref 5–15)
BUN: 31 mg/dL — ABNORMAL HIGH (ref 8–23)
CO2: 23 mmol/L (ref 22–32)
Calcium: 9.7 mg/dL (ref 8.9–10.3)
Chloride: 104 mmol/L (ref 98–111)
Creatinine, Ser: 1.26 mg/dL — ABNORMAL HIGH (ref 0.61–1.24)
GFR, Estimated: 60 mL/min — ABNORMAL LOW (ref >60)
Glucose, Bld: 89 mg/dL (ref 70–99)
Potassium: 3.8 mmol/L (ref 3.5–5.1)
Sodium: 136 mmol/L (ref 135–145)
Total Bilirubin: 2.5 mg/dL — ABNORMAL HIGH (ref 0.0–1.2)
Total Protein: 8.3 g/dL — ABNORMAL HIGH (ref 6.5–8.1)

## 2024-04-08 LAB — MAGNESIUM: Magnesium: 1.8 mg/dL (ref 1.7–2.4)

## 2024-04-08 LAB — BRAIN NATRIURETIC PEPTIDE: B Natriuretic Peptide: 664 pg/mL — ABNORMAL HIGH (ref 0.0–100.0)

## 2024-04-08 MED ORDER — AMIODARONE HCL 200 MG PO TABS: ORAL_TABLET | ORAL | 0 refills | Status: DC

## 2024-04-08 MED ORDER — FARXIGA 10 MG PO TABS: 10.0000 mg | ORAL_TABLET | Freq: Every day | ORAL | 11 refills | Status: DC

## 2024-04-08 NOTE — Patient Instructions (Signed)
 Medication Changes:  START Amiodarone  200mg  (1 tab) two times daily FOR 10 DAYS ONLY THEN START 200mg  (1 tab) daily  START Farxiga  10mg  (1 tab) daily   Lab Work:  Go over to the [facility]. Go pass the gift shop and have your blood work completed TODAY AND AGAIN IN 10 DAYS.  We will only call you if the results are abnormal or if the provider would like to make medication changes.    If you are having labs drawn today, any labs that are abnormal the clinic will call you. No news is good news.   Testing/Procedures:  Your physician has requested that you have an echocardiogram. Echocardiography is a painless test that uses sound waves to create images of your heart. It provides your doctor with information about the size and shape of your heart and how well your heart's chambers and valves are working. This procedure takes approximately one hour. There are no restrictions for this procedure. Please do NOT wear cologne, perfume, aftershave, or lotions (deodorant is allowed). Please arrive 15 minutes prior to your appointment time.  Please note: We ask at that you not bring children with you during ultrasound (echo/ vascular) testing. Due to room size and safety concerns, children are not allowed in the ultrasound rooms during exams. Our front office staff cannot provide observation of children in our lobby area while testing is being conducted. An adult accompanying a patient to their appointment will only be allowed in the ultrasound room at the discretion of the ultrasound technician under special circumstances. We apologize for any inconvenience.    Follow-Up in: Please follow up with the [facility] in 4-6 weeks.   Thank you for choosing [facility].    At the [facility], you and your health needs are our priority. We have a designated team specialized in the treatment of Heart Failure. This Care Team  includes your primary Heart Failure Specialized Cardiologist (physician), Advanced Practice Providers (APPs- Physician Assistants and Nurse Practitioners), and Pharmacist who all work together to provide you with the care you need, when you need it.   You may see any of the following providers on your designated Care Team at your next follow up:  Dr. Toribio Fuel Dr. Ezra Shuck Dr. Ria Commander Dr. Morene Brownie Ellouise Class, FNP Jaun Bash, RPH-CPP  Please be sure to bring in all your medications bottles to every appointment.   Need to Contact Us :  If you have any questions or concerns before your next appointment please send us  a message through [place] or call our office at [phone].    TO LEAVE A MESSAGE FOR THE NURSE SELECT OPTION 2, PLEASE LEAVE A MESSAGE INCLUDING: YOUR NAME DATE OF BIRTH CALL BACK NUMBER REASON FOR CALL**this is important as we prioritize the call backs  YOU WILL RECEIVE A CALL BACK THE SAME DAY AS LONG AS YOU CALL BEFORE 4:00 PM

## 2024-04-10 NOTE — Progress Notes (Signed)
 [facility] Note    PCP: Joshua Cathryne BROCKS, MD (Inactive) HF Cardiology: Dr. Rolan  Chief Complaint: shortness of breath  HPI:  Scott Meyer is a 73 y.o. male with a history of HTN, frequent PVCs/NSVT, atrial tachycardia, right arm amputation, previous tobacco use and chronic systolic heart failure. Echo 3/22 showed EF 25-30% along with moderate LVH.   Admitted 11/18/22 due to SOB and increased pedal edema due to a/c HF exacerbation. Initially given IV lasix  with transition to oral diuretics. Echo 11/19/22 with EF 20-25% along with LVH along with Grade II DD and mild/moderate Scott.      He has not had ischemic workup, has refused cardiac catheterization.  Not interested in any invasive procedures such as ICD placement.   Zio monitor in 5/25 showed 1281 runs of NSVT, longest 12.9 seconds.  18.3% PVCs.   Patient returns for followup of CHF. Main complaint is that is feels a ball rolling around in his chest (?palpitations).  It is not painful.  He says that this is going to continue until December.  Shortness of breath comes and goes. He feels like all his problems began with the COVID vaccination.  Generally does ok walking on flat ground.  Able to mow his grass.  No lightheadedness or syncope.  He is taking daily torsemide  20 mg.  He has been generally reticent to take medications.   ECG (personally reviewed): NSR, LBBB 146 msec, frequent PVCs  Labs (3/25): K 4.6, creatinine 1.3, LDL 76  PMH: 1. HTN 2. CKD stage 3 3. 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 Chronic systolic CHF: Etiology uncertain.   - Echo (3/22): EF 25-30%, moderate LVH.  - Echo (3/24): EF 20-25%, LVH, mild-moderate Scott.  5. Frequent PVCs: Zio (5/25) with 1281 runs of NSVT, longest 12.9 seconds.  18.3% PVCs. 6. Hyperlipidemia  ROS: All systems negative except as listed in HPI, PMH and Problem List.  SH:  Social History   Socioeconomic History   Marital status: Single    Spouse name: Not on  file   Number of children: Not on file   Years of education: Not on file   Highest education level: Not on file  Occupational History   Occupation: Retired  Tobacco Use   Smoking status: Former    Current packs/day: 0.00    Types: Cigarettes    Quit date: 10/09/2017    Years since quitting: 6.5   Smokeless tobacco: Never  Vaping Use   Vaping status: Never Used  Substance and Sexual Activity   Alcohol use: No    Comment: quit 8 months ago   Drug use: No   Sexual activity: Not on file  Other Topics Concern   Not on file  Social History Narrative   Lives in [place] w/ his sister.  Does not routinely exercise.   Social Drivers of Corporate investment banker Strain: Low Risk  (06/11/2023)   Overall Financial Resource Strain (CARDIA)    Difficulty of Paying Living Expenses: Not hard at all  Food Insecurity: No Food Insecurity (06/11/2023)   Hunger Vital Sign    Worried About Running Out of Food in the Last Year: Never true    Ran Out of Food in the Last Year: Never true  Transportation Needs: No Transportation Needs (06/11/2023)   PRAPARE - Administrator, Civil Service (Medical): No    Lack of Transportation (Non-Medical): No  Physical Activity: Inactive (06/11/2023)   Exercise Vital Sign  Days of Exercise per Week: 0 days    Minutes of Exercise per Session: 0 min  Stress: No Stress Concern Present (06/11/2023)   [employer] of Occupational Health - Occupational Stress Questionnaire    Feeling of Stress : Not at all  Social Connections: Socially Isolated (06/11/2023)   Social Connection and Isolation Panel    Frequency of Communication with Friends and Family: Never    Frequency of Social Gatherings with Friends and Family: Never    Attends Religious Services: Never    Database administrator or Organizations: No    Attends Banker Meetings: Never    Marital Status: Never married  Intimate Partner Violence: Not At Risk (06/11/2023)   Humiliation,  Afraid, Rape, and Kick questionnaire    Fear of Current or Ex-Partner: No    Emotionally Abused: No    Physically Abused: No    Sexually Abused: No    FH:  Family History  Problem Relation Age of Onset   Diabetes Mother    Cirrhosis Father    Alcohol abuse Father    Other Brother        gsw   Cancer Brother        'head full of tumors'   Other Brother        'killed w/ 2x4 to the head'   Other Sister        mva    Current Outpatient Medications  Medication Sig Dispense Refill   acetaminophen  (TYLENOL ) 500 MG tablet Take 500 mg by mouth every 6 (six) hours as needed for mild pain.     amiodarone  (PACERONE ) 200 MG tablet Take 1 tablet (200 mg total) by mouth 2 (two) times daily for 10 days, THEN 1 tablet (200 mg total) daily. 40 tablet 0   aspirin  EC 81 MG tablet Take 1 tablet (81 mg total) by mouth daily. Swallow whole. 90 tablet 0   atorvastatin  (LIPITOR) 40 MG tablet Take 1 tablet (40 mg total) by mouth daily. TAKE 1 TABLET(40 MG) BY MOUTH DAILY Strength: 40 mg 30 tablet 0   FARXIGA  10 MG TABS tablet Take 1 tablet (10 mg total) by mouth daily before breakfast. 30 tablet 11   metoprolol  succinate (TOPROL -XL) 25 MG 24 hr tablet Take 0.5 tablets (12.5 mg total) by mouth daily. 15 tablet 0   spironolactone  (ALDACTONE ) 25 MG tablet Take 1 tablet (25 mg total) by mouth daily. 30 tablet 0   torsemide  (DEMADEX ) 20 MG tablet TAKE 1/2 TABLET BY MOUTH ONCE EVERY OTHER DAY 45 tablet 3   No current facility-administered medications for this visit.   Vitals:   04/08/24 1531  BP: 133/79  Pulse: 69  SpO2: 100%  Weight: 197 lb (89.4 kg)   Wt Readings from Last 3 Encounters:  04/08/24 197 lb (89.4 kg)  02/05/24 203 lb (92.1 kg)  12/04/23 209 lb 4 oz (94.9 kg)   Lab Results  Component Value Date   CREATININE 1.26 (H) 04/08/2024   CREATININE 1.31 (H) 12/04/2023   CREATININE 1.10 02/13/2023    PHYSICAL EXAM: General: NAD Neck: JVP 8-9 cm, no thyromegaly or thyroid nodule.   Lungs: Clear to auscultation bilaterally with normal respiratory effort. CV: Nondisplaced PMI.  Heart regular S1/S2, no S3/S4, no murmur.  1+ ankle edema.  No carotid bruit.  Normal pedal pulses.  Abdomen: Soft, nontender, no hepatosplenomegaly, no distention.  Skin: Intact without lesions or rashes.  Neurologic: Alert and oriented x 3.  Psych: Normal affect.  Extremities: No clubbing or cyanosis. S/p right arm amputation below elbow.  HEENT: Normal.   ASSESSMENT & PLAN:  1: Chronic systolic CHF: Echo in 3/22 with EF 25-30%, moderate LVH.  Echo in 3/24 with EF 20-25%, LVH, mild-moderate Scott.  Etiology is uncertain.  He has been noted to have a high burden of PVCs, Zio monitor in 5/25 showed 1281 runs of NSVT, longest 12.9 seconds; 18.3% PVCs.  PVCs may contribute to cardiomyopathy.  He has a LBBB, 146 msec.  This additionally could contribute. He has refused ischemic evaluation with cardiac catheterization and generally has been reticent to take medications.  Will need to try to titrate GDMT slowly to gain acceptance.  He has some unusual ideas about the causation of his cardiac problems. NYHA class II-III, mild volume overload on exam.  - Continue torsemide  20 mg daily and I will add Farxiga  10 mg daily to get a little extra fluid off. CMET/BNP today, BMET in 10 days.  - Continue spironolactone  25 mg daily.  - Would try to start Entresto  at next appt.  - Off Toprol  XL with mild bradycardia (HR measures around 50 but think this is due to PVCs).  - I think we should attempt to suppress his very frequent PVCs.  - He is not interested in invasive procedures including cardiac catheterization and ICD placement (would be candidate for CRT-D device with LBBB).  - He is willing to get a repeat echo, will arrange.  Would like to do cardiac MRI in the future.  2: HTN: BP controlled on current regimen.  3: PVCs: Very frequent, 18.3% on 5/25 Zio monitor with NSVT.  This may contribute to his cardiomyopathy.   Frequent PVCs noted on ECG today.  - Start amiodarone  200 mg bid x 10 days then decrease to 200 mg daily after that.  Discussed possible side effects, will need to follow screening protocol.  - Repeat Zio monitor eventually on amiodarone .  4: Hyperlipidemia: Continue atorvastatin .   Followup in 1 month.   I spent 32 minutes reviewing records, interviewing/examining patient, and managing orders.    Scott Shuck, MD 04/10/24

## 2024-04-12 ENCOUNTER — Encounter: Admitting: Student

## 2024-04-19 ENCOUNTER — Other Ambulatory Visit: Payer: Self-pay | Admitting: Family Medicine

## 2024-04-19 DIAGNOSIS — I10 Essential (primary) hypertension: Secondary | ICD-10-CM

## 2024-04-19 DIAGNOSIS — E7801 Familial hypercholesterolemia: Secondary | ICD-10-CM

## 2024-04-19 DIAGNOSIS — I420 Dilated cardiomyopathy: Secondary | ICD-10-CM

## 2024-04-20 ENCOUNTER — Ambulatory Visit (INDEPENDENT_AMBULATORY_CARE_PROVIDER_SITE_OTHER): Admitting: Student

## 2024-04-20 ENCOUNTER — Encounter: Payer: Self-pay | Admitting: Student

## 2024-04-20 VITALS — BP 116/64 | HR 65 | Ht 75.0 in | Wt 195.4 lb

## 2024-04-20 DIAGNOSIS — I493 Ventricular premature depolarization: Secondary | ICD-10-CM

## 2024-04-20 DIAGNOSIS — I10 Essential (primary) hypertension: Secondary | ICD-10-CM

## 2024-04-20 DIAGNOSIS — I5022 Chronic systolic (congestive) heart failure: Secondary | ICD-10-CM | POA: Diagnosis not present

## 2024-04-20 MED ORDER — SPIRONOLACTONE 25 MG PO TABS: 25.0000 mg | ORAL_TABLET | Freq: Every day | ORAL | 1 refills | Status: DC

## 2024-04-20 NOTE — Assessment & Plan Note (Addendum)
 Normotensive on spironolactone . Cardiology plans to start on entreso in the future, has had reluctance to start new medications. Refill spironolactone  today.

## 2024-04-20 NOTE — Assessment & Plan Note (Signed)
 See cardiology, GDMT includes spironolactone  25 mg daily and farxiga . Metoprolol  stopped for bradycardia. He has been out of spiro for about 1 week. Refilled this for him. He is agreeable to taking this. Weight is down trending doing well on torsemide  20 mg. Reminded him of Cardiology appointment in August.

## 2024-04-20 NOTE — Assessment & Plan Note (Addendum)
 Complete amiodarone  load and has been taking amiodarone  200 mg daily starting yesterday. Has not taken it yet today. Does feel rumbling sensation in chest is improved. Discussed labs to check thyroid while on this medication but he would like to defer this today. Encouraged him to continue taking medications daily.

## 2024-04-20 NOTE — Patient Instructions (Addendum)
 Please pick up spironolactone  and take 25 mg daily or 1 pill day

## 2024-04-20 NOTE — Progress Notes (Signed)
 Established Patient Office Visit  Subjective   Patient ID: Scott Meyer, male    DOB: 1950/12/19  Age: 73 y.o. MRN: [id number]  Chief Complaint  Patient presents with   Transitions Of Care    Patient is here today to transition from Dr. Joshua to new PCP   Patient presents today for transition of care. No acute complaints, feels rumbling/?palpations in chest have improved since staring amiodarone . Denies shortness of breath, nausea, and vomiting.  Patient Active Problem List   Diagnosis Date Noted   Anasarca 11/18/2022   Atrial tachycardia (HCC) 11/30/2020   Frequent PVCs 11/30/2020   Hypertension 11/29/2020   HLD (hyperlipidemia) 11/29/2020   Amputation of right arm (HCC) 11/29/2020   Chronic systolic heart failure (HCC) 11/29/2020      ROS Refer to HPI    Objective:     BP 116/64   Pulse 65   Ht 6' 3 (1.905 m)   Wt 195 lb 6 oz (88.6 kg)   SpO2 97%   BMI 24.42 kg/m  BP Readings from Last 3 Encounters:  04/20/24 116/64  04/08/24 133/79  02/05/24 (!) 150/62    Physical Exam Constitutional:      Appearance: Normal appearance.  HENT:     Mouth/Throat:     Mouth: Mucous membranes are moist.     Pharynx: Oropharynx is clear.  Cardiovascular:     Rate and Rhythm: Normal rate and regular rhythm.     Heart sounds: No murmur heard. Pulmonary:     Effort: Pulmonary effort is normal.     Breath sounds: No rhonchi or rales.  Abdominal:     General: Abdomen is flat. Bowel sounds are normal. There is no distension.     Palpations: Abdomen is soft.     Tenderness: There is no abdominal tenderness.  Musculoskeletal:        General: Normal range of motion.     Right lower leg: Edema (1+) present.     Left lower leg: Edema (1+) present.  Skin:    General: Skin is warm and dry.     Capillary Refill: Capillary refill takes less than 2 seconds.  Neurological:     General: No focal deficit present.     Mental Status: He is alert and oriented to person, place, and  time.  Psychiatric:        Mood and Affect: Mood normal.        Behavior: Behavior normal.        04/20/2024    3:15 PM 10/09/2023    2:07 PM 06/11/2023    3:02 PM  Depression screen PHQ 2/9  Decreased Interest 1 1 0  Down, Depressed, Hopeless 1 1 0  PHQ - 2 Score 2 2 0  Altered sleeping 0 0 0  Tired, decreased energy 1 1 0  Change in appetite 2 1 0  Feeling bad or failure about yourself  0 0 0  Trouble concentrating 1 1 0  Moving slowly or fidgety/restless 1 0 0  Suicidal thoughts 0 0 0  PHQ-9 Score 7 5 0  Difficult doing work/chores Somewhat difficult Somewhat difficult Not difficult at all       04/20/2024    3:15 PM 10/09/2023    2:08 PM 01/23/2023   10:29 AM 11/28/2022    2:50 PM  GAD 7 : Generalized Anxiety Score  Nervous, Anxious, on Edge 1 1 0 0  Control/stop worrying 1 2 0 0  Worry too much - different  things 0 2 0 0  Trouble relaxing 1 0 0 0  Restless 0 1 0 0  Easily annoyed or irritable 0 0 0 0  Afraid - awful might happen 0 1 0 0  Total GAD 7 Score 3 7 0 0  Anxiety Difficulty Somewhat difficult Somewhat difficult Not difficult at all Not difficult at all    No results found for any visits on 04/20/24.  Last CBC Lab Results  Component Value Date   WBC 6.0 11/28/2022   HGB 14.0 11/28/2022   HCT 42.5 11/28/2022   MCV 93 11/28/2022   MCH 30.7 11/28/2022   RDW 11.6 11/28/2022   PLT 143 (L) 11/28/2022   Last metabolic panel Lab Results  Component Value Date   GLUCOSE 89 04/08/2024   NA 136 04/08/2024   K 3.8 04/08/2024   CL 104 04/08/2024   CO2 23 04/08/2024   BUN 31 (H) 04/08/2024   CREATININE 1.26 (H) 04/08/2024   GFRNONAA 60 (L) 04/08/2024   CALCIUM  9.7 04/08/2024   PHOS 2.7 06/13/2021   PROT 8.3 (H) 04/08/2024   ALBUMIN 4.1 04/08/2024   BILITOT 2.5 (H) 04/08/2024   ALKPHOS 85 04/08/2024   AST 34 04/08/2024   ALT 23 04/08/2024   ANIONGAP 9 04/08/2024      The ASCVD Risk score (Arnett DK, et al., 2019) failed to calculate for the following  reasons:   The valid total cholesterol range is 130 to 320 mg/dL    Assessment & Plan:  Primary hypertension Assessment & Plan: Normotensive on spironolactone . Cardiology plans to start on entreso in the future, has had reluctance to start new medications. Refill spironolactone  today.   Orders: -     Spironolactone ; Take 1 tablet (25 mg total) by mouth daily.  Dispense: 90 tablet; Refill: 1  Frequent PVCs Assessment & Plan: Complete amiodarone  load and has been taking amiodarone  200 mg daily starting yesterday. Has not taken it yet today. Does feel rumbling sensation in chest is improved. Discussed labs to check thyroid while on this medication but he would like to defer this today. Encouraged him to continue taking medications daily.    Chronic systolic heart failure (HCC) Assessment & Plan: See cardiology, GDMT includes spironolactone  25 mg daily and farxiga . Metoprolol  stopped for bradycardia. He has been out of spiro for about 1 week. Refilled this for him. He is agreeable to taking this. Weight is down trending doing well on torsemide  20 mg. Reminded him of Cardiology appointment in August.   Orders: -     Spironolactone ; Take 1 tablet (25 mg total) by mouth daily.  Dispense: 90 tablet; Refill: 1     Return in about 4 months (around 08/20/2024) for physical.    Harlene Saddler, MD

## 2024-04-21 NOTE — Telephone Encounter (Signed)
 Spironolactone  refilled on 04/20/24, will refuse this request.  Requested Prescriptions  Pending Prescriptions Disp Refills   atorvastatin  (LIPITOR) 40 MG tablet [Pharmacy Med Name: ATORVASTATIN  40MG  TABLETS] 90 tablet 0    Sig: TAKE 1 TABLET BY MOUTH ONCE DAILY.     Cardiovascular:  Antilipid - Statins Failed - 04/21/2024 10:19 AM      Failed - Lipid Panel in normal range within the last 12 months    Cholesterol, Total  Date Value Ref Range Status  12/04/2023 129 100 - 199 mg/dL Final   LDL Chol Calc (NIH)  Date Value Ref Range Status  12/04/2023 76 0 - 99 mg/dL Final   HDL  Date Value Ref Range Status  12/04/2023 42 >39 mg/dL Final   Triglycerides  Date Value Ref Range Status  12/04/2023 47 0 - 149 mg/dL Final         Passed - Patient is not pregnant      Passed - Valid encounter within last 12 months    Recent Outpatient Visits           Yesterday Primary hypertension   [facility], Harlene, MD              Refused Prescriptions Disp Refills   spironolactone  (ALDACTONE ) 25 MG tablet [Pharmacy Med Name: SPIRONOLACTONE  25MG  TABLETS] 90 tablet     Sig: TAKE 1 TABLET(25 MG) BY MOUTH DAILY     Cardiovascular: Diuretics - Aldosterone Antagonist Failed - 04/21/2024 10:19 AM      Failed - Cr in normal range and within 180 days    Creatinine, Ser  Date Value Ref Range Status  04/08/2024 1.26 (H) 0.61 - 1.24 mg/dL Final         Passed - K in normal range and within 180 days    Potassium  Date Value Ref Range Status  04/08/2024 3.8 3.5 - 5.1 mmol/L Final         Passed - Na in normal range and within 180 days    Sodium  Date Value Ref Range Status  04/08/2024 136 135 - 145 mmol/L Final  12/04/2023 140 134 - 144 mmol/L Final         Passed - eGFR is 30 or above and within 180 days    GFR calc Af Amer  Date Value Ref Range Status  06/16/2019 69 >59 mL/min/1.73 Final   GFR, Estimated  Date Value Ref  Range Status  04/08/2024 60 (L) >60 mL/min Final    Comment:    (NOTE) Calculated using the CKD-EPI Creatinine Equation (2021)    eGFR  Date Value Ref Range Status  12/04/2023 57 (L) >59 mL/min/1.73 Final         Passed - Last BP in normal range    BP Readings from Last 1 Encounters:  04/20/24 116/64         Passed - Valid encounter within last 6 months    Recent Outpatient Visits           Yesterday Primary hypertension   [facility], MD

## 2024-05-03 ENCOUNTER — Telehealth: Payer: Self-pay | Admitting: Family

## 2024-05-03 NOTE — Telephone Encounter (Signed)
 Called to confirm/remind patient of their appointment at the [facility] on 05/04/24.   Appointment:   [x] Confirmed  [] Left mess   [] No answer/No voice mail  [] VM Full/unable to leave message  [] Phone not in service  Patient reminded to bring all medications and/or complete list.  Confirmed patient has transportation. Gave directions, instructed to utilize valet parking.

## 2024-05-04 ENCOUNTER — Ambulatory Visit (HOSPITAL_BASED_OUTPATIENT_CLINIC_OR_DEPARTMENT_OTHER): Admitting: Cardiology

## 2024-05-04 ENCOUNTER — Other Ambulatory Visit
Admission: RE | Admit: 2024-05-04 | Discharge: 2024-05-04 | Disposition: A | Discharge status: Home / Self Care | Source: Ambulatory Visit | Attending: Cardiology | Admitting: Cardiology

## 2024-05-04 ENCOUNTER — Inpatient Hospital Stay (HOSPITAL_COMMUNITY)
Admission: RE | Admit: 2024-05-04 | Discharge: 2024-05-04 | Disposition: A | Discharge status: Home / Self Care | Source: Ambulatory Visit | Attending: Cardiology | Admitting: Cardiology

## 2024-05-04 ENCOUNTER — Other Ambulatory Visit (HOSPITAL_COMMUNITY): Payer: Self-pay | Admitting: Cardiology

## 2024-05-04 ENCOUNTER — Encounter: Payer: Self-pay | Admitting: Cardiology

## 2024-05-04 VITALS — BP 130/72 | HR 62 | Wt 197.1 lb

## 2024-05-04 DIAGNOSIS — I5022 Chronic systolic (congestive) heart failure: Secondary | ICD-10-CM | POA: Diagnosis present

## 2024-05-04 DIAGNOSIS — I493 Ventricular premature depolarization: Secondary | ICD-10-CM

## 2024-05-04 LAB — COMPREHENSIVE METABOLIC PANEL WITH GFR
ALT: 28 U/L (ref 0–44)
AST: 42 U/L — ABNORMAL HIGH (ref 15–41)
Albumin: 3.8 g/dL (ref 3.5–5.0)
Alkaline Phosphatase: 79 U/L (ref 38–126)
Anion gap: 9 (ref 5–15)
BUN: 23 mg/dL (ref 8–23)
CO2: 22 mmol/L (ref 22–32)
Calcium: 9.5 mg/dL (ref 8.9–10.3)
Chloride: 106 mmol/L (ref 98–111)
Creatinine, Ser: 1.37 mg/dL — ABNORMAL HIGH (ref 0.61–1.24)
GFR, Estimated: 54 mL/min — ABNORMAL LOW (ref >60)
Glucose, Bld: 83 mg/dL (ref 70–99)
Potassium: 4.1 mmol/L (ref 3.5–5.1)
Sodium: 137 mmol/L (ref 135–145)
Total Bilirubin: 2.6 mg/dL — ABNORMAL HIGH (ref 0.0–1.2)
Total Protein: 7.7 g/dL (ref 6.5–8.1)

## 2024-05-04 LAB — BRAIN NATRIURETIC PEPTIDE: B Natriuretic Peptide: 401.9 pg/mL — ABNORMAL HIGH (ref 0.0–100.0)

## 2024-05-04 LAB — TSH: TSH: 16.007 u[IU]/mL — ABNORMAL HIGH (ref 0.350–4.500)

## 2024-05-04 MED ORDER — AMIODARONE HCL 200 MG PO TABS: 200.0000 mg | ORAL_TABLET | Freq: Every day | ORAL | 1 refills | Status: DC

## 2024-05-04 MED ORDER — FARXIGA 10 MG PO TABS: 10.0000 mg | ORAL_TABLET | Freq: Every day | ORAL | 11 refills | Status: AC

## 2024-05-04 NOTE — Patient Instructions (Signed)
 Medication Changes:  RESTART Farxiga  10mg  (1 tab) daily  REFILLED Amiodarone   Lab Work:  Go over to the [facility]ICAL [facility]. Go pass the gift shop and have your blood work completed.  We will only call you if the results are abnormal or if the provider would like to make medication changes.  No news is good news.   Testing/Procedures:  Your provider has recommended that  you wear a Zio Patch for 14 days.  This monitor will record your heart rhythm for our review.  IF you have any symptoms while wearing the monitor please press the button.  If you have any issues with the patch or you notice a red or orange light on it please call the company at [phone].  Once you remove the patch please mail it back to the company as soon as possible so we can get the results.    Follow-Up in: Please follow up with the [facility] in September with Ellouise Class, FNP.   Thank you for choosing [facility].    At the [facility], you and your health needs are our priority. We have a designated team specialized in the treatment of Heart Failure. This Care Team includes your primary Heart Failure Specialized Cardiologist (physician), Advanced Practice Providers (APPs- Physician Assistants and Nurse Practitioners), and Pharmacist who all work together to provide you with the care you need, when you need it.   You may see any of the following providers on your designated Care Team at your next follow up:  Dr. Toribio Fuel Dr. Ezra Shuck Dr. Ria Commander Dr. Morene Brownie Ellouise Class, FNP Jaun Bash, RPH-CPP  Please be sure to bring in all your medications bottles to every appointment.   Need to Contact Us :  If you have any questions or concerns before your next appointment please send us  a message through [place] or call our office at [phone].    TO LEAVE A MESSAGE FOR THE NURSE SELECT OPTION 2, PLEASE  LEAVE A MESSAGE INCLUDING: YOUR NAME DATE OF BIRTH CALL BACK NUMBER REASON FOR CALL**this is important as we prioritize the call backs  YOU WILL RECEIVE A CALL BACK THE SAME DAY AS LONG AS YOU CALL BEFORE 4:00 PM

## 2024-05-04 NOTE — Progress Notes (Signed)
 Zio patch placed onto patient.  All instructions and information reviewed with patient, they verbalize understanding with no questions.

## 2024-05-05 ENCOUNTER — Ambulatory Visit (HOSPITAL_COMMUNITY): Payer: Self-pay | Admitting: Cardiology

## 2024-05-05 NOTE — Progress Notes (Signed)
 [facility] Note    PCP: Lemon Raisin, MD HF Cardiology: Dr. Rolan  Chief Complaint: shortness of breath  HPI:  Scott Meyer is a 73 y.o. male with a history of HTN, frequent PVCs/NSVT, atrial tachycardia, right arm amputation, previous tobacco use and chronic systolic heart failure. Echo 3/22 showed EF 25-30% along with moderate LVH.   Admitted 11/18/22 due to SOB and increased pedal edema due to a/c HF exacerbation. Initially given IV lasix  with transition to oral diuretics. Echo 11/19/22 with EF 20-25% along with LVH along with Grade II DD and mild/moderate Scott.      He has not had ischemic workup, has refused cardiac catheterization.  Not interested in any invasive procedures such as ICD placement.   Zio monitor in 5/25 showed 1281 runs of NSVT, longest 12.9 seconds; 18.3% PVCs. He has been started on amiodarone  to manage PVCs.   Patient returns for followup of CHF.  He stopped taking Farxiga  about a week ago, not sure why.  He still has it at home.  No palpitations now, says he feels better on amiodarone .  Weight stable.  No chest pain.  Getting rushing sensation in his chest less.  Says he is not short of breath with any of his usual activities.  No lightheadedness.  Thinks the full moon has a major effect on his heart.   ECG (personally reviewed): NSR, LBBB 162 msec, PVCs  Labs (3/25): K 4.6, creatinine 1.3, LDL 76 Labs (7/25): K 3.8, creatinine 1.26, LFTs normal  PMH: 1. HTN 2. CKD stage 3 3. 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 Chronic systolic CHF: Etiology uncertain.   - Echo (3/22): EF 25-30%, moderate LVH.  - Echo (3/24): EF 20-25%, LVH, mild-moderate Scott.  5. Frequent PVCs: Zio (5/25) with 1281 runs of NSVT, longest 12.9 seconds.  18.3% PVCs. 6. Hyperlipidemia  ROS: All systems negative except as listed in HPI, PMH and Problem List.  SH:  Social History   Socioeconomic History   Marital status: Single    Spouse name: Not on file   Number  of children: Not on file   Years of education: Not on file   Highest education level: Not on file  Occupational History   Occupation: Retired  Tobacco Use   Smoking status: Former    Current packs/day: 0.00    Types: Cigarettes    Quit date: 10/09/2017    Years since quitting: 6.5   Smokeless tobacco: Never  Vaping Use   Vaping status: Never Used  Substance and Sexual Activity   Alcohol use: No    Comment: quit 8 months ago   Drug use: No   Sexual activity: Not on file  Other Topics Concern   Not on file  Social History Narrative   Lives in [place] w/ his sister.  Does not routinely exercise.   Social Drivers of Corporate investment banker Strain: Low Risk  (06/11/2023)   Overall Financial Resource Strain (CARDIA)    Difficulty of Paying Living Expenses: Not hard at all  Food Insecurity: No Food Insecurity (06/11/2023)   Hunger Vital Sign    Worried About Running Out of Food in the Last Year: Never true    Ran Out of Food in the Last Year: Never true  Transportation Needs: No Transportation Needs (06/11/2023)   PRAPARE - Administrator, Civil Service (Medical): No    Lack of Transportation (Non-Medical): No  Physical Activity: Inactive (06/11/2023)   Exercise Vital  Sign    Days of Exercise per Week: 0 days    Minutes of Exercise per Session: 0 min  Stress: No Stress Concern Present (06/11/2023)   [employer] of Occupational Health - Occupational Stress Questionnaire    Feeling of Stress : Not at all  Social Connections: Socially Isolated (06/11/2023)   Social Connection and Isolation Panel    Frequency of Communication with Friends and Family: Never    Frequency of Social Gatherings with Friends and Family: Never    Attends Religious Services: Never    Database administrator or Organizations: No    Attends Banker Meetings: Never    Marital Status: Never married  Intimate Partner Violence: Not At Risk (06/11/2023)   Humiliation, Afraid, Rape,  and Kick questionnaire    Fear of Current or Ex-Partner: No    Emotionally Abused: No    Physically Abused: No    Sexually Abused: No    FH:  Family History  Problem Relation Age of Onset   Diabetes Mother    Cirrhosis Father    Alcohol abuse Father    Other Brother        gsw   Cancer Brother        'head full of tumors'   Other Brother        'killed w/ 2x4 to the head'   Other Sister        mva    Current Outpatient Medications  Medication Sig Dispense Refill   acetaminophen  (TYLENOL ) 500 MG tablet Take 500 mg by mouth every 6 (six) hours as needed for mild pain.     aspirin  EC 325 MG tablet Take 325 mg by mouth every 4 (four) hours as needed for moderate pain (pain score 4-6) (for chest pain).     atorvastatin  (LIPITOR) 40 MG tablet TAKE 1 TABLET BY MOUTH ONCE DAILY. (Patient taking differently: Take 20 mg by mouth daily.) 90 tablet 0   spironolactone  (ALDACTONE ) 25 MG tablet Take 1 tablet (25 mg total) by mouth daily. 90 tablet 1   torsemide  (DEMADEX ) 20 MG tablet TAKE 1/2 TABLET BY MOUTH ONCE EVERY OTHER DAY (Patient taking differently: Take 20 mg by mouth daily.) 45 tablet 3   amiodarone  (PACERONE ) 200 MG tablet Take 1 tablet (200 mg total) by mouth daily. 90 tablet 1   aspirin  EC 81 MG tablet Take 1 tablet (81 mg total) by mouth daily. Swallow whole. 90 tablet 0   FARXIGA  10 MG TABS tablet Take 1 tablet (10 mg total) by mouth daily before breakfast. 30 tablet 11   No current facility-administered medications for this visit.   Vitals:   05/04/24 1412  BP: 130/72  Pulse: 62  SpO2: 100%  Weight: 197 lb 2 oz (89.4 kg)   Wt Readings from Last 3 Encounters:  05/04/24 197 lb 2 oz (89.4 kg)  04/20/24 195 lb 6 oz (88.6 kg)  04/08/24 197 lb (89.4 kg)   Lab Results  Component Value Date   CREATININE 1.37 (H) 05/04/2024   CREATININE 1.26 (H) 04/08/2024   CREATININE 1.31 (H) 12/04/2023    PHYSICAL EXAM: General: NAD Neck: No JVD, no thyromegaly or thyroid nodule.   Lungs: Clear to auscultation bilaterally with normal respiratory effort. CV: Nondisplaced PMI.  Heart regular S1/S2, no S3/S4, no murmur.  No peripheral edema.  No carotid bruit.  Normal pedal pulses.  Abdomen: Soft, nontender, no hepatosplenomegaly, no distention.  Skin: Intact without lesions or rashes.  Neurologic: Alert  and oriented x 3.  Psych: Normal affect. Extremities: No clubbing or cyanosis. Right arm amputation HEENT: Normal.   ASSESSMENT & PLAN:  1: Chronic systolic CHF: Echo in 3/22 with EF 25-30%, moderate LVH.  Echo in 3/24 with EF 20-25%, LVH, mild-moderate Scott.  Etiology is uncertain.  He has been noted to have a high burden of PVCs, Zio monitor in 5/25 showed 1281 runs of NSVT, longest 12.9 seconds; 18.3% PVCs.  PVCs may contribute to cardiomyopathy.  He has a LBBB, 162 msec.  This additionally could contribute. He has refused ischemic evaluation with cardiac catheterization and generally has been reticent to take medications.  Will need to try to titrate GDMT slowly to gain acceptance.  He has some unusual ideas about the causation of his cardiac problems. NYHA class II, not volume overloaded.  - Continue torsemide  20 mg daily. BMET/BNP today.  - He is willing to restart Farxiga  10 mg daily.   - Continue spironolactone  25 mg daily.  - Would try to start Entresto  at next appt.  - Trying to suppress his frequent PVCs with amiodarone .  - He is not interested in invasive procedures including cardiac catheterization and ICD placement (would be candidate for CRT-D device with LBBB).  - Repeat echo scheduled for September.  Would like to do cardiac MRI in the future.  2: HTN: BP controlled on current regimen.  3: PVCs: Very frequent, 18.3% on 5/25 Zio monitor with NSVT.  This may contribute to his cardiomyopathy. He is now on amiodarone .  - Continue amiodarone  200 mg daily, check LFTs, TSH today.  He should get a regular eye exam.  - Repeat Zio monitor x 2 wks to reassess PVCs on  amiodarone .  4: Hyperlipidemia: Continue atorvastatin .   Followup in 1 month with APP.   I spent 31 minutes reviewing records, interviewing/examining patient, and managing orders.    Scott Shuck, MD 05/05/24

## 2024-05-07 NOTE — Telephone Encounter (Signed)
-----   Message from Ezra Shuck sent at 05/05/2024 10:59 AM EDT ----- TSH is high.  Bring back to check TSH, free T3, free T4. ----- Message ----- From: Interface, [facility] In [place] Sent: 05/04/2024   4:03 PM EDT To: Ezra GORMAN Shuck, MD

## 2024-05-07 NOTE — Telephone Encounter (Signed)
 Called for labs. No answer. Mailbox is full.

## 2024-05-30 ENCOUNTER — Ambulatory Visit (HOSPITAL_COMMUNITY): Payer: Self-pay | Admitting: Cardiology

## 2024-06-08 ENCOUNTER — Ambulatory Visit
Admission: RE | Admit: 2024-06-08 | Discharge: 2024-06-08 | Disposition: A | Discharge status: Home / Self Care | Source: Ambulatory Visit | Attending: Cardiology | Admitting: Cardiology

## 2024-06-08 DIAGNOSIS — I5022 Chronic systolic (congestive) heart failure: Secondary | ICD-10-CM | POA: Insufficient documentation

## 2024-06-08 DIAGNOSIS — I083 Combined rheumatic disorders of mitral, aortic and tricuspid valves: Secondary | ICD-10-CM | POA: Insufficient documentation

## 2024-06-08 LAB — ECHOCARDIOGRAM COMPLETE
AR max vel: 2.22 cm2
AV Area VTI: 2.34 cm2
AV Area mean vel: 2.07 cm2
AV Mean grad: 5 mmHg
AV Peak grad: 8 mmHg
Ao pk vel: 1.41 m/s
Area-P 1/2: 3.65 cm2
Calc EF: 12.2 %
MV M vel: 3.75 m/s
MV Peak grad: 56.3 mmHg
MV VTI: 1.6 cm2
Radius: 0.5 cm
S' Lateral: 6.3 cm
Single Plane A2C EF: -9.1 %
Single Plane A4C EF: 23 %

## 2024-06-09 ENCOUNTER — Telehealth: Payer: Self-pay | Admitting: Family

## 2024-06-09 NOTE — Telephone Encounter (Signed)
 Called pt to verify that he is attending appointment. Pt states that he is coming and is agreeable to bringing ALL medications for review. No further questions at this time.

## 2024-06-09 NOTE — Progress Notes (Unsigned)
 Advanced Heart Failure Clinic Note    PCP: Lemon Raisin, MD HF Cardiology: Dr. Rolan  Chief Complaint: shortness of breath  HPI:  Scott Meyer is a 73 y.o. male with a history of HTN, frequent PVCs/NSVT, atrial tachycardia, right arm amputation, previous tobacco use and chronic systolic heart failure. Echo 3/22 showed EF 25-30% along with moderate LVH.   Admitted 11/18/22 due to SOB and increased pedal edema due to a/c HF exacerbation. Initially given IV lasix  with transition to oral diuretics. Echo 11/19/22 with EF 20-25% along with LVH along with Grade II DD and mild/moderate Scott.      He has not had ischemic workup, has refused cardiac catheterization.  Not interested in any invasive procedures such as ICD placement.   Zio monitor in 5/25 showed 1281 runs of NSVT, longest 12.9 seconds; 18.3% PVCs. He has been started on amiodarone  to manage PVCs.   Patient returns for followup of CHF.  He stopped taking Farxiga  about a week ago, not sure why.  He still has it at home.  No palpitations now, says he feels better on amiodarone .  Weight stable.  No chest pain.  Getting rushing sensation in his chest less.  Says he is not short of breath with any of his usual activities.  No lightheadedness.  Thinks the full moon has a major effect on his heart.   ECG (personally reviewed): NSR, LBBB 162 msec, PVCs  Labs (3/25): K 4.6, creatinine 1.3, LDL 76 Labs (7/25): K 3.8, creatinine 1.26, LFTs normal  PMH: 1. HTN 2. CKD stage 3 3. 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 Chronic systolic CHF: Etiology uncertain.   - Echo (3/22): EF 25-30%, moderate LVH.  - Echo (3/24): EF 20-25%, LVH, mild-moderate Scott.  5. Frequent PVCs: Zio (5/25) with 1281 runs of NSVT, longest 12.9 seconds.  18.3% PVCs. 6. Hyperlipidemia  ROS: All systems negative except as listed in HPI, PMH and Problem List.  SH:  Social History   Socioeconomic History   Marital status: Single    Spouse name: Not on file   Number  of children: Not on file   Years of education: Not on file   Highest education level: Not on file  Occupational History   Occupation: Retired  Tobacco Use   Smoking status: Former    Current packs/day: 0.00    Types: Cigarettes    Quit date: 10/09/2017    Years since quitting: 6.6   Smokeless tobacco: Never  Vaping Use   Vaping status: Never Used  Substance and Sexual Activity   Alcohol use: No    Comment: quit 8 months ago   Drug use: No   Sexual activity: Not on file  Other Topics Concern   Not on file  Social History Narrative   Lives in [place] w/ his sister.  Does not routinely exercise.   Social Drivers of Corporate investment banker Strain: Low Risk  (06/11/2023)   Overall Financial Resource Strain (CARDIA)    Difficulty of Paying Living Expenses: Not hard at all  Food Insecurity: No Food Insecurity (06/11/2023)   Hunger Vital Sign    Worried About Running Out of Food in the Last Year: Never true    Ran Out of Food in the Last Year: Never true  Transportation Needs: No Transportation Needs (06/11/2023)   PRAPARE - Administrator, Civil Service (Medical): No    Lack of Transportation (Non-Medical): No  Physical Activity: Inactive (06/11/2023)   Exercise Vital  Sign    Days of Exercise per Week: 0 days    Minutes of Exercise per Session: 0 min  Stress: No Stress Concern Present (06/11/2023)   [employer] of Occupational Health - Occupational Stress Questionnaire    Feeling of Stress : Not at all  Social Connections: Socially Isolated (06/11/2023)   Social Connection and Isolation Panel    Frequency of Communication with Friends and Family: Never    Frequency of Social Gatherings with Friends and Family: Never    Attends Religious Services: Never    Database administrator or Organizations: No    Attends Banker Meetings: Never    Marital Status: Never married  Intimate Partner Violence: Not At Risk (06/11/2023)   Humiliation, Afraid, Rape,  and Kick questionnaire    Fear of Current or Ex-Partner: No    Emotionally Abused: No    Physically Abused: No    Sexually Abused: No    FH:  Family History  Problem Relation Age of Onset   Diabetes Mother    Cirrhosis Father    Alcohol abuse Father    Other Brother        gsw   Cancer Brother        'head full of tumors'   Other Brother        'killed w/ 2x4 to the head'   Other Sister        mva    Current Outpatient Medications  Medication Sig Dispense Refill   acetaminophen  (TYLENOL ) 500 MG tablet Take 500 mg by mouth every 6 (six) hours as needed for mild pain.     amiodarone  (PACERONE ) 200 MG tablet Take 1 tablet (200 mg total) by mouth daily. 90 tablet 1   aspirin  EC 325 MG tablet Take 325 mg by mouth every 4 (four) hours as needed for moderate pain (pain score 4-6) (for chest pain).     aspirin  EC 81 MG tablet Take 1 tablet (81 mg total) by mouth daily. Swallow whole. 90 tablet 0   atorvastatin  (LIPITOR) 40 MG tablet TAKE 1 TABLET BY MOUTH ONCE DAILY. (Patient taking differently: Take 20 mg by mouth daily.) 90 tablet 0   FARXIGA  10 MG TABS tablet Take 1 tablet (10 mg total) by mouth daily before breakfast. 30 tablet 11   spironolactone  (ALDACTONE ) 25 MG tablet Take 1 tablet (25 mg total) by mouth daily. 90 tablet 1   torsemide  (DEMADEX ) 20 MG tablet TAKE 1/2 TABLET BY MOUTH ONCE EVERY OTHER DAY (Patient taking differently: Take 20 mg by mouth daily.) 45 tablet 3   No current facility-administered medications for this visit.   There were no vitals filed for this visit.  Wt Readings from Last 3 Encounters:  05/04/24 197 lb 2 oz (89.4 kg)  04/20/24 195 lb 6 oz (88.6 kg)  04/08/24 197 lb (89.4 kg)   Lab Results  Component Value Date   CREATININE 1.37 (H) 05/04/2024   CREATININE 1.26 (H) 04/08/2024   CREATININE 1.31 (H) 12/04/2023    PHYSICAL EXAM: General: NAD Neck: No JVD, no thyromegaly or thyroid  nodule.  Lungs: Clear to auscultation bilaterally with normal  respiratory effort. CV: Nondisplaced PMI.  Heart regular S1/S2, no S3/S4, no murmur.  No peripheral edema.  No carotid bruit.  Normal pedal pulses.  Abdomen: Soft, nontender, no hepatosplenomegaly, no distention.  Skin: Intact without lesions or rashes.  Neurologic: Alert and oriented x 3.  Psych: Normal affect. Extremities: No clubbing or cyanosis. Right arm  amputation HEENT: Normal.   ASSESSMENT & PLAN:  1: Chronic systolic CHF: Echo in 3/22 with EF 25-30%, moderate LVH.  Echo in 3/24 with EF 20-25%, LVH, mild-moderate Scott.  Etiology is uncertain.  He has been noted to have a high burden of PVCs, Zio monitor in 5/25 showed 1281 runs of NSVT, longest 12.9 seconds; 18.3% PVCs.  PVCs may contribute to cardiomyopathy.  He has a LBBB, 162 msec.  This additionally could contribute. He has refused ischemic evaluation with cardiac catheterization and generally has been reticent to take medications.  Will need to try to titrate GDMT slowly to gain acceptance.  He has some unusual ideas about the causation of his cardiac problems. NYHA class II, not volume overloaded.  - Continue torsemide  20 mg daily. BMET/BNP today.  - He is willing to restart Farxiga  10 mg daily.   - Continue spironolactone  25 mg daily.  - Would try to start Entresto  at next appt.  - Trying to suppress his frequent PVCs with amiodarone .  - He is not interested in invasive procedures including cardiac catheterization and ICD placement (would be candidate for CRT-D device with LBBB).  - Repeat echo scheduled for September.  Would like to do cardiac MRI in the future.  2: HTN: BP controlled on current regimen.  3: PVCs: Very frequent, 18.3% on 5/25 Zio monitor with NSVT.  This may contribute to his cardiomyopathy. He is now on amiodarone .  - Continue amiodarone  200 mg daily, check LFTs, TSH today.  He should get a regular eye exam.  - Repeat Zio monitor x 2 wks to reassess PVCs on amiodarone .  4: Hyperlipidemia: Continue atorvastatin .    Followup in 1 month with APP.   I spent 31 minutes reviewing records, interviewing/examining patient, and managing orders.    Ellouise DELENA Class, FNP 06/09/24

## 2024-06-09 NOTE — Telephone Encounter (Signed)
 Called to confirm/remind patient of their appointment at the [facility] on 06/10/24.   Appointment:   [x] Confirmed  [] Left mess   [] No answer/No voice mail  [] VM Full/unable to leave message  [] Phone not in service  Patient reminded to bring all medications and/or complete list.  Confirmed patient has transportation. Gave directions, instructed to utilize valet parking.

## 2024-06-10 ENCOUNTER — Ambulatory Visit: Attending: Family | Admitting: Family

## 2024-06-10 ENCOUNTER — Encounter: Payer: Self-pay | Admitting: Family

## 2024-06-10 VITALS — BP 141/83 | HR 68 | Wt 201.4 lb

## 2024-06-10 DIAGNOSIS — I493 Ventricular premature depolarization: Secondary | ICD-10-CM | POA: Diagnosis not present

## 2024-06-10 DIAGNOSIS — E782 Mixed hyperlipidemia: Secondary | ICD-10-CM | POA: Diagnosis not present

## 2024-06-10 DIAGNOSIS — I5022 Chronic systolic (congestive) heart failure: Secondary | ICD-10-CM | POA: Insufficient documentation

## 2024-06-10 DIAGNOSIS — R42 Dizziness and giddiness: Secondary | ICD-10-CM | POA: Insufficient documentation

## 2024-06-10 DIAGNOSIS — I4719 Other supraventricular tachycardia: Secondary | ICD-10-CM | POA: Insufficient documentation

## 2024-06-10 DIAGNOSIS — I472 Ventricular tachycardia, unspecified: Secondary | ICD-10-CM | POA: Diagnosis not present

## 2024-06-10 DIAGNOSIS — I10 Essential (primary) hypertension: Secondary | ICD-10-CM | POA: Diagnosis not present

## 2024-06-10 DIAGNOSIS — Z89211 Acquired absence of right upper limb below elbow: Secondary | ICD-10-CM | POA: Insufficient documentation

## 2024-06-10 DIAGNOSIS — I130 Hypertensive heart and chronic kidney disease with heart failure and stage 1 through stage 4 chronic kidney disease, or unspecified chronic kidney disease: Secondary | ICD-10-CM | POA: Insufficient documentation

## 2024-06-10 DIAGNOSIS — Z79899 Other long term (current) drug therapy: Secondary | ICD-10-CM | POA: Diagnosis not present

## 2024-06-10 DIAGNOSIS — I447 Left bundle-branch block, unspecified: Secondary | ICD-10-CM | POA: Diagnosis not present

## 2024-06-10 DIAGNOSIS — R202 Paresthesia of skin: Secondary | ICD-10-CM | POA: Diagnosis not present

## 2024-06-10 DIAGNOSIS — Z87891 Personal history of nicotine dependence: Secondary | ICD-10-CM | POA: Diagnosis not present

## 2024-06-10 DIAGNOSIS — N183 Chronic kidney disease, stage 3 unspecified: Secondary | ICD-10-CM | POA: Diagnosis not present

## 2024-06-10 DIAGNOSIS — R946 Abnormal results of thyroid function studies: Secondary | ICD-10-CM | POA: Insufficient documentation

## 2024-06-10 DIAGNOSIS — I429 Cardiomyopathy, unspecified: Secondary | ICD-10-CM | POA: Insufficient documentation

## 2024-06-10 MED ORDER — METOPROLOL SUCCINATE ER 25 MG PO TB24: 25.0000 mg | ORAL_TABLET | Freq: Every day | ORAL | 3 refills | Status: DC

## 2024-06-10 MED ORDER — METOPROLOL SUCCINATE ER 25 MG PO TB24: 12.5000 mg | ORAL_TABLET | Freq: Every day | ORAL | 3 refills | Status: DC

## 2024-06-10 NOTE — Patient Instructions (Signed)
 Medication Changes:  Start Metoprolol  succinate 25 MG once daily   Lab Work:  Go downstairs to [facility] on LOWER LEVEL to have your blood work completed.  We will only call you if the results are abnormal or if the provider would like to make medication changes.  No news is good news.   Follow-Up in: 1 month with Ellouise Class, FNP.   Thank you for choosing [facility].    At the Advanced [facility] Failure Clinic, you and your health needs are our priority. We have a designated team specialized in the treatment of Heart Failure. This Care Team includes your primary Heart Failure Specialized Cardiologist (physician), Advanced Practice Providers (APPs- Physician Assistants and Nurse Practitioners), and Pharmacist who all work together to provide you with the care you need, when you need it.   You may see any of the following providers on your designated Care Team at your next follow up:  Dr. Toribio Fuel Dr. Ezra Shuck Dr. Ria Commander Dr. Morene Brownie Ellouise Class, FNP Jaun Bash, RPH-CPP  Please be sure to bring in all your medications bottles to every appointment.   Need to Contact Us :  If you have any questions or concerns before your next appointment please send us  a message through [place] or call our office at [phone].    TO LEAVE A MESSAGE FOR THE NURSE SELECT OPTION 2, PLEASE LEAVE A MESSAGE INCLUDING: YOUR NAME DATE OF BIRTH CALL BACK NUMBER REASON FOR CALL**this is important as we prioritize the call backs  YOU WILL RECEIVE A CALL BACK THE SAME DAY AS LONG AS YOU CALL BEFORE 4:00 PM

## 2024-06-11 ENCOUNTER — Ambulatory Visit: Payer: Self-pay | Admitting: Family

## 2024-06-11 LAB — T3, FREE: T3, Free: 2.3 pg/mL (ref 2.0–4.4)

## 2024-06-11 LAB — TSH: TSH: 49.8 u[IU]/mL — ABNORMAL HIGH (ref 0.450–4.500)

## 2024-06-11 LAB — T4, FREE: Free T4: 0.48 ng/dL — ABNORMAL LOW (ref 0.82–1.77)

## 2024-06-11 MED ORDER — LEVOTHYROXINE SODIUM 25 MCG PO TABS: 25.0000 ug | ORAL_TABLET | Freq: Every day | ORAL | 5 refills | Status: DC

## 2024-06-16 ENCOUNTER — Ambulatory Visit: Payer: Self-pay

## 2024-06-16 DIAGNOSIS — Z0000 Encounter for general adult medical examination without abnormal findings: Secondary | ICD-10-CM | POA: Diagnosis not present

## 2024-06-16 NOTE — Progress Notes (Signed)
 Subjective:   Scott Meyer is a 73 y.o. who presents for a Medicare Wellness preventive visit.  As a reminder, Annual Wellness Visits don't include a physical exam, and some assessments may be limited, especially if this visit is performed virtually. We may recommend an in-person follow-up visit with your provider if needed.  Visit Complete: Virtual I connected with  Scott Meyer on 06/16/24 by a audio enabled telemedicine application and verified that I am speaking with the correct person using two identifiers.  Patient Location: Home  Provider Location: Home Office  I discussed the limitations of evaluation and management by telemedicine. The patient expressed understanding and agreed to proceed.  Vital Signs: Because this visit was a virtual/telehealth visit, some criteria may be missing or patient reported. Any vitals not documented were not able to be obtained and vitals that have been documented are patient reported.  VideoDeclined- This patient declined Librarian, academic. Therefore the visit was completed with audio only.  Persons Participating in Visit: Patient.  AWV Questionnaire: No: Patient Medicare AWV questionnaire was not completed prior to this visit.  Cardiac Risk Factors include: advanced age (>38men, >95 women);dyslipidemia;hypertension;male gender;sedentary lifestyle;smoking/ tobacco exposure     Objective:    There were no vitals filed for this visit. There is no height or weight on file to calculate BMI.     06/16/2024    1:29 PM 06/11/2023    3:04 PM 04/19/2023    1:14 PM 11/19/2022   12:00 PM 11/28/2020   11:21 AM 11/27/2020    5:51 PM 04/08/2019   12:07 PM  Advanced Directives  Does Patient Have a Medical Advance Directive? No No No No No No No  Would patient like information on creating a medical advance directive? No - Patient declined No - Patient declined  No - Patient declined No - Patient declined No - Patient declined      Current Medications (verified) Outpatient Encounter Medications as of 06/16/2024  Medication Sig   acetaminophen  (TYLENOL ) 500 MG tablet Take 500 mg by mouth every 6 (six) hours as needed for mild pain.   amiodarone  (PACERONE ) 200 MG tablet Take 1 tablet (200 mg total) by mouth daily.   aspirin  EC 325 MG tablet Take 325 mg by mouth every 4 (four) hours as needed for moderate pain (pain score 4-6) (for chest pain).   atorvastatin  (LIPITOR) 40 MG tablet TAKE 1 TABLET BY MOUTH ONCE DAILY.   FARXIGA  10 MG TABS tablet Take 1 tablet (10 mg total) by mouth daily before breakfast.   levothyroxine  (SYNTHROID ) 25 MCG tablet Take 1 tablet (25 mcg total) by mouth daily before breakfast.   metoprolol  succinate (TOPROL -XL) 25 MG 24 hr tablet Take 1 tablet (25 mg total) by mouth daily. Take with or immediately following a meal.   spironolactone  (ALDACTONE ) 25 MG tablet Take 1 tablet (25 mg total) by mouth daily.   torsemide  (DEMADEX ) 20 MG tablet TAKE 1/2 TABLET BY MOUTH ONCE EVERY OTHER DAY   No facility-administered encounter medications on file as of 06/16/2024.    Allergies (verified) Patient has no known allergies.   History: Past Medical History:  Diagnosis Date   Cardiomyopathy (HCC)    a. 11/2020 Echo: EF 25-30%, mod LVH, Gr1 DD. Mild AoV Ca2+ w/ mild to mod AoV sclerosis.   CHF (congestive heart failure) (HCC)    Hypertension    Past Surgical History:  Procedure Laterality Date   ARM AMPUTATION AT ELBOW Right    KNEE  SURGERY     Family History  Problem Relation Age of Onset   Diabetes Mother    Cirrhosis Father    Alcohol abuse Father    Other Brother        gsw   Cancer Brother        'head full of tumors'   Other Brother        'killed w/ 2x4 to the head'   Other Sister        mva   Social History   Socioeconomic History   Marital status: Single    Spouse name: Not on file   Number of children: Not on file   Years of education: Not on file   Highest education  level: Not on file  Occupational History   Occupation: Retired  Tobacco Use   Smoking status: Former    Current packs/day: 0.00    Types: Cigarettes    Quit date: 10/09/2017    Years since quitting: 6.6   Smokeless tobacco: Never  Vaping Use   Vaping status: Never Used  Substance and Sexual Activity   Alcohol use: No    Comment: quit 8 months ago   Drug use: No   Sexual activity: Not on file  Other Topics Concern   Not on file  Social History Narrative   Lives in [place] w/ his sister.  Does not routinely exercise.   Social Drivers of Corporate investment banker Strain: Low Risk  (06/16/2024)   Overall Financial Resource Strain (CARDIA)    Difficulty of Paying Living Expenses: Not hard at all  Food Insecurity: No Food Insecurity (06/16/2024)   Hunger Vital Sign    Worried About Running Out of Food in the Last Year: Never true    Ran Out of Food in the Last Year: Never true  Transportation Needs: No Transportation Needs (06/16/2024)   PRAPARE - Administrator, Civil Service (Medical): No    Lack of Transportation (Non-Medical): No  Physical Activity: Insufficiently Active (06/16/2024)   Exercise Vital Sign    Days of Exercise per Week: 2 days    Minutes of Exercise per Session: 20 min  Stress: No Stress Concern Present (06/16/2024)   [employer] of Occupational Health - Occupational Stress Questionnaire    Feeling of Stress: Not at all  Social Connections: Socially Isolated (06/16/2024)   Social Connection and Isolation Panel    Frequency of Communication with Friends and Family: Never    Frequency of Social Gatherings with Friends and Family: Never    Attends Religious Services: Never    Database administrator or Organizations: No    Attends Engineer, structural: Never    Marital Status: Never married    Tobacco Counseling Counseling given: Not Answered    Clinical Intake:  Pre-visit preparation completed: Yes        BMI - recorded:  25.1 Nutritional Status: BMI 25 -29 Overweight Nutritional Risks: None Diabetes: No  Lab Results  Component Value Date   HGBA1C 5.5 11/19/2022   HGBA1C 4.9 11/29/2020     How often do you need to have someone help you when you read instructions, pamphlets, or other written materials from your doctor or pharmacy?: 1 - Never  Interpreter Needed?: No  Information entered by :: Scott DAS, LPN   Activities of Daily Living     06/16/2024    1:36 PM  In your present state of health, do you have any difficulty performing  the following activities:  Hearing? 1  Vision? 0  Difficulty concentrating or making decisions? 0  Walking or climbing stairs? 1  Dressing or bathing? 0  Doing errands, shopping? 0  Preparing Food and eating ? N  Using the Toilet? N  In the past six months, have you accidently leaked urine? N  Do you have problems with loss of bowel control? N  Managing your Medications? N  Managing your Finances? N  Housekeeping or managing your Housekeeping? N    Patient Care Team: Lemon Raisin, MD as PCP - General (Internal Medicine) End, Lonni, MD as PCP - Cardiology (Cardiology)  I have updated your Care Teams any recent Medical Services you may have received from other providers in the past year.     Assessment:   This is a routine wellness examination for Scott Meyer.  Hearing/Vision screen Hearing Screening - Comments:: CAN'T HEAR RIGHT EAR- NO AIDS Vision Screening - Comments:: READERS- DOESN'T WANT AN EYE DOCTOR   Goals Addressed             This Visit's Progress    DIET - INCREASE WATER INTAKE         Depression Screen     06/16/2024    1:27 PM 04/20/2024    3:15 PM 10/09/2023    2:07 PM 06/11/2023    3:02 PM 01/23/2023   10:29 AM 11/28/2022    2:49 PM 11/18/2022    2:53 PM  PHQ 2/9 Scores  PHQ - 2 Score 0 2 2 0 0 0 0  PHQ- 9 Score 0 7 5 0 0 0 0    Fall Risk     06/16/2024    1:35 PM 04/20/2024    3:15 PM 10/09/2023    2:07 PM 06/11/2023     3:04 PM 01/23/2023   10:28 AM  Fall Risk   Falls in the past year? 1 1 1 1  0  Number falls in past yr: 1 0 0 1 0  Injury with Fall? 0 0 0  0  Risk for fall due to :  History of fall(s) Impaired balance/gait History of fall(s) No Fall Risks  Follow up Falls evaluation completed;Falls prevention discussed Falls evaluation completed Falls evaluation completed Falls prevention discussed;Falls evaluation completed Falls evaluation completed    MEDICARE RISK AT HOME:  Medicare Risk at Home Any stairs in or around the home?: Yes If so, are there any without handrails?: No Home free of loose throw rugs in walkways, pet beds, electrical cords, etc?: Yes Adequate lighting in your home to reduce risk of falls?: Yes Life alert?: No Use of a cane, walker or w/c?: No Grab bars in the bathroom?: No Shower chair or bench in shower?: No Elevated toilet seat or a handicapped toilet?: No  TIMED UP AND GO:  Was the test performed?  No  Cognitive Function: 6CIT completed        06/16/2024    1:39 PM 06/11/2023    3:06 PM 05/24/2022   11:31 AM  6CIT Screen  What Year? 0 points 0 points 0 points  What month? 0 points 0 points 0 points  What time? 0 points 3 points 0 points  Count back from 20 0 points 0 points 0 points  Months in reverse 0 points 0 points 0 points  Repeat phrase 0 points 0 points 2 points  Total Score 0 points 3 points 2 points    Immunizations Immunization History  Administered Date(s) Administered   Fluad Quad(high  Dose 65+) 06/16/2019, 05/24/2022   PNEUMOCOCCAL CONJUGATE-20 05/24/2022    Screening Tests Health Maintenance  Topic Date Due   Colonoscopy  Never done   Zoster Vaccines- Shingrix (1 of 2) Never done   Influenza Vaccine  04/16/2024   COVID-19 Vaccine (1 - 2024-25 season) Never done   Medicare Annual Wellness (AWV)  06/16/2025   Pneumococcal Vaccine: 50+ Years  Completed   Hepatitis C Screening  Completed   HPV VACCINES  Aged Out   Meningococcal B Vaccine   Aged Out   DTaP/Tdap/Td  Discontinued    Health Maintenance Items Addressed: UP TO DATE ON PNA; NEEDS COVID & SHINGRIX; DECLINED COLONOSCOPY REFERRAL  Additional Screening:  Vision Screening: Recommended annual ophthalmology exams for early detection of glaucoma and other disorders of the eye. Is the patient up to date with their annual eye exam?  No  Who is the provider or what is the name of the office in which the patient attends annual eye exams? NO ONE- DOESN'T WANT REFERRAL  Dental Screening: Recommended annual dental exams for proper oral hygiene  Community Resource Referral / Chronic Care Management: CRR required this visit?  No   CCM required this visit?  No   Plan:    I have personally reviewed and noted the following in the patient's chart:   Medical and social history Use of alcohol, tobacco or illicit drugs  Current medications and supplements including opioid prescriptions. Patient is not currently taking opioid prescriptions. Functional ability and status Nutritional status Physical activity Advanced directives List of other physicians Hospitalizations, surgeries, and ER visits in previous 12 months Vitals Screenings to include cognitive, depression, and falls Referrals and appointments  In addition, I have reviewed and discussed with patient certain preventive protocols, quality metrics, and best practice recommendations. A written personalized care plan for preventive services as well as general preventive health recommendations were provided to patient.   Scott GORMAN Das, LPN   [id number]/[id number]   After Visit Summary: (MyChart) Due to this being a telephonic visit, the after visit summary with patients personalized plan was offered to patient via MyChart   Notes: Nothing significant to report at this time.

## 2024-06-16 NOTE — Patient Instructions (Addendum)
 Scott Meyer,  Thank you for taking the time for your Medicare Wellness Visit. I appreciate your continued commitment to your health goals. Please review the care plan we discussed, and feel free to reach out if I can assist you further.  Medicare recommends these wellness visits once per year to help you and your care team stay ahead of potential health issues. These visits are designed to focus on prevention, allowing your provider to concentrate on managing your acute and chronic conditions during your regular appointments.  Please note that Annual Wellness Visits do not include a physical exam. Some assessments may be limited, especially if the visit was conducted virtually. If needed, we may recommend a separate in-person follow-up with your provider.  Ongoing Care Seeing your primary care provider every 3 to 6 months helps us  monitor your health and provide consistent, personalized care.   Referrals If a referral was made during today's visit and you haven't received any updates within two weeks, please contact the referred provider directly to check on the status.  Recommended Screenings:  Health Maintenance  Topic Date Due   Colon Cancer Screening  Never done   Zoster (Shingles) Vaccine (1 of 2) Never done   Flu Shot  04/16/2024   COVID-19 Vaccine (1 - 2024-25 season) Never done   Medicare Annual Wellness Visit  06/16/2025   Pneumococcal Vaccine for age over 36  Completed   Hepatitis C Screening  Completed   HPV Vaccine  Aged Out   Meningitis B Vaccine  Aged Out   DTaP/Tdap/Td vaccine  Discontinued    Advance Care Planning is important because it: Ensures you receive medical care that aligns with your values, goals, and preferences. Provides guidance to your family and loved ones, reducing the emotional burden of decision-making during critical moments.  Vision: Annual vision screenings are recommended for early detection of glaucoma, cataracts, and diabetic retinopathy. These  exams can also reveal signs of chronic conditions such as diabetes and high blood pressure.  Dental: Annual dental screenings help detect early signs of oral cancer, gum disease, and other conditions linked to overall health, including heart disease and diabetes.  Please see the attached documents for additional preventive care recommendations.   NEXT AWV 06/29/25 @ 10:50 AM BY PHONE

## 2024-07-14 ENCOUNTER — Telehealth: Payer: Self-pay | Admitting: Family

## 2024-07-14 NOTE — Telephone Encounter (Signed)
 Called to confirm/remind patient of their appointment at the [facility] on 07/15/24.   Appointment:   [x] Confirmed  [] Left mess   [] No answer/No voice mail  [] VM Full/unable to leave message  [] Phone not in service  Patient reminded to bring all medications and/or complete list.  Confirmed patient has transportation. Gave directions, instructed to utilize valet parking.

## 2024-07-15 ENCOUNTER — Ambulatory Visit: Attending: Family | Admitting: Family

## 2024-07-15 ENCOUNTER — Encounter: Payer: Self-pay | Admitting: Family

## 2024-07-15 VITALS — BP 112/87 | HR 59 | Wt 200.0 lb

## 2024-07-15 DIAGNOSIS — R5383 Other fatigue: Secondary | ICD-10-CM | POA: Insufficient documentation

## 2024-07-15 DIAGNOSIS — Z7984 Long term (current) use of oral hypoglycemic drugs: Secondary | ICD-10-CM | POA: Diagnosis not present

## 2024-07-15 DIAGNOSIS — I5022 Chronic systolic (congestive) heart failure: Secondary | ICD-10-CM | POA: Diagnosis present

## 2024-07-15 DIAGNOSIS — I447 Left bundle-branch block, unspecified: Secondary | ICD-10-CM | POA: Diagnosis not present

## 2024-07-15 DIAGNOSIS — I130 Hypertensive heart and chronic kidney disease with heart failure and stage 1 through stage 4 chronic kidney disease, or unspecified chronic kidney disease: Secondary | ICD-10-CM | POA: Diagnosis not present

## 2024-07-15 DIAGNOSIS — I493 Ventricular premature depolarization: Secondary | ICD-10-CM | POA: Insufficient documentation

## 2024-07-15 DIAGNOSIS — N183 Chronic kidney disease, stage 3 unspecified: Secondary | ICD-10-CM | POA: Diagnosis not present

## 2024-07-15 DIAGNOSIS — I472 Ventricular tachycardia, unspecified: Secondary | ICD-10-CM | POA: Insufficient documentation

## 2024-07-15 DIAGNOSIS — I10 Essential (primary) hypertension: Secondary | ICD-10-CM

## 2024-07-15 DIAGNOSIS — Z79899 Other long term (current) drug therapy: Secondary | ICD-10-CM | POA: Diagnosis not present

## 2024-07-15 DIAGNOSIS — R946 Abnormal results of thyroid function studies: Secondary | ICD-10-CM | POA: Diagnosis not present

## 2024-07-15 DIAGNOSIS — R300 Dysuria: Secondary | ICD-10-CM | POA: Insufficient documentation

## 2024-07-15 DIAGNOSIS — E782 Mixed hyperlipidemia: Secondary | ICD-10-CM | POA: Diagnosis not present

## 2024-07-15 DIAGNOSIS — Z87891 Personal history of nicotine dependence: Secondary | ICD-10-CM | POA: Insufficient documentation

## 2024-07-15 DIAGNOSIS — Z79890 Hormone replacement therapy (postmenopausal): Secondary | ICD-10-CM | POA: Insufficient documentation

## 2024-07-15 MED ORDER — TORSEMIDE 20 MG PO TABS: 10.0000 mg | ORAL_TABLET | Freq: Every day | ORAL | Status: AC

## 2024-07-15 NOTE — Patient Instructions (Signed)
 Medication Changes:  Start Valsartan  20 MG once daily   Lab Work:  Go downstairs to [facility] on [address] to have your blood work completed.  We will only call you if the results are abnormal or if the provider would like to make medication changes.  No news is good news.   Follow-Up in: 1 month with Scott Class, FNP.   Thank you for choosing [facility].    At the [facility], you and your health needs are our priority. We have a designated team specialized in the treatment of Heart Failure. This Care Team includes your primary Heart Failure Specialized Cardiologist (physician), Advanced Practice Providers (APPs- Physician Assistants and Nurse Practitioners), and Pharmacist who all work together to provide you with the care you need, when you need it.   You may see any of the following providers on your designated Care Team at your next follow up:  Dr. Toribio Fuel Dr. Ezra Shuck Dr. Ria Commander Dr. Morene Brownie Scott Class, FNP Jaun Bash, RPH-CPP  Please be sure to bring in all your medications bottles to every appointment.   Need to Contact Us :  If you have any questions or concerns before your next appointment please send us  a message through [facility] or call our office at [phone].    TO LEAVE A MESSAGE FOR THE NURSE SELECT OPTION 2, PLEASE LEAVE A MESSAGE INCLUDING: YOUR NAME DATE OF BIRTH CALL BACK NUMBER REASON FOR CALL**this is important as we prioritize the call backs  YOU WILL RECEIVE A CALL BACK THE SAME DAY AS LONG AS YOU CALL BEFORE 4:00 PM

## 2024-07-15 NOTE — Progress Notes (Signed)
 [facility] Note    PCP: Lemon Raisin, MD HF Cardiology: Dr. Rolan  Chief Complaint: fatigue   HPI:  Scott Meyer is a 73 y.o. male with a history of HTN, frequent PVCs/NSVT, atrial tachycardia, right arm amputation, previous tobacco use and chronic systolic heart failure. Echo 3/22 showed EF 25-30% along with moderate LVH.   Admitted 11/18/22 due to SOB and increased pedal edema due to a/c HF exacerbation. Initially given IV lasix  with transition to oral diuretics. Echo 11/19/22 with EF 20-25% along with LVH along with Grade II DD and mild/moderate Scott.      He has not had ischemic workup, has refused cardiac catheterization.  Not interested in any invasive procedures such as ICD placement.   Zio monitor in 5/25 showed 1281 runs of NSVT, longest 12.9 seconds; 18.3% PVCs. He has been started on amiodarone  to manage PVCs.   He presents today for a HF follow-up visit with a chief complaint of fatigue. Has associated dizziness (improving), pedal edema, surge in right leg at times.  Has some dysuria first thing in the morning. Drinking gingerale/ sunkist during the day although is unclear of quantity. Little bit of water drank during the day.  2 days ago he stopped all of his meds just because and then he started feeling bad so he resumed taking everything and feels better. Says that he now understands how much the medications are helping his heart.   ECG: not done  Labs (3/25): K 4.6, creatinine 1.3, LDL 76 Labs (7/25): K 3.8, creatinine 1.26, LFTs normal Labs (8/25): K 4.1, creatinine 1.37, BNP 401.9, TSH 16.007  PMH: 1. HTN 2. CKD stage 3 3. 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 Chronic systolic CHF: Etiology uncertain.   - Echo (3/22): EF 25-30%, moderate LVH.  - Echo (3/24): EF 20-25%, LVH, mild-moderate Scott.  - Echo (9/25): EF 20-25%, G2DD, mildly reduced RV, mild-moderate Scott  5. Frequent PVCs: Zio (5/25) with 1281 runs of NSVT, longest 12.9 seconds.   18.3% PVCs. - Zio (09/25) with 37 runs of NSVT, longest 7 beats, 16.2% PVC's 6. Hyperlipidemia  ROS: All systems negative except as listed in HPI, PMH and Problem List.  SH:  Social History   Socioeconomic History   Marital status: Single    Spouse name: Not on file   Number of children: Not on file   Years of education: Not on file   Highest education level: Not on file  Occupational History   Occupation: Retired  Tobacco Use   Smoking status: Former    Current packs/day: 0.00    Types: Cigarettes    Quit date: 10/09/2017    Years since quitting: 6.7   Smokeless tobacco: Never  Vaping Use   Vaping status: Never Used  Substance and Sexual Activity   Alcohol use: No    Comment: quit 8 months ago   Drug use: No   Sexual activity: Not on file  Other Topics Concern   Not on file  Social History Narrative   Lives in [place] w/ his sister.  Does not routinely exercise.   Social Drivers of Corporate Investment Banker Strain: Low Risk  (06/16/2024)   Overall Financial Resource Strain (CARDIA)    Difficulty of Paying Living Expenses: Not hard at all  Food Insecurity: No Food Insecurity (06/16/2024)   Hunger Vital Sign    Worried About Running Out of Food in the Last Year: Never true    Ran Out of Food  in the Last Year: Never true  Transportation Needs: No Transportation Needs (06/16/2024)   PRAPARE - Administrator, Civil Service (Medical): No    Lack of Transportation (Non-Medical): No  Physical Activity: Insufficiently Active (06/16/2024)   Exercise Vital Sign    Days of Exercise per Week: 2 days    Minutes of Exercise per Session: 20 min  Stress: No Stress Concern Present (06/16/2024)   [employer] of Occupational Health - Occupational Stress Questionnaire    Feeling of Stress: Not at all  Social Connections: Socially Isolated (06/16/2024)   Social Connection and Isolation Panel    Frequency of Communication with Friends and Family: Never    Frequency of  Social Gatherings with Friends and Family: Never    Attends Religious Services: Never    Database Administrator or Organizations: No    Attends Banker Meetings: Never    Marital Status: Never married  Intimate Partner Violence: Not At Risk (06/16/2024)   Humiliation, Afraid, Rape, and Kick questionnaire    Fear of Current or Ex-Partner: No    Emotionally Abused: No    Physically Abused: No    Sexually Abused: No    FH:  Family History  Problem Relation Age of Onset   Diabetes Mother    Cirrhosis Father    Alcohol abuse Father    Other Brother        gsw   Cancer Brother        'head full of tumors'   Other Brother        'killed w/ 2x4 to the head'   Other Sister        mva    Current Outpatient Medications  Medication Sig Dispense Refill   acetaminophen  (TYLENOL ) 500 MG tablet Take 500 mg by mouth every 6 (six) hours as needed for mild pain.     amiodarone  (PACERONE ) 200 MG tablet Take 1 tablet (200 mg total) by mouth daily. 90 tablet 1   aspirin  EC 325 MG tablet Take 325 mg by mouth every 4 (four) hours as needed for moderate pain (pain score 4-6) (for chest pain).     atorvastatin  (LIPITOR) 40 MG tablet TAKE 1 TABLET BY MOUTH ONCE DAILY. 90 tablet 0   FARXIGA  10 MG TABS tablet Take 1 tablet (10 mg total) by mouth daily before breakfast. 30 tablet 11   levothyroxine  (SYNTHROID ) 25 MCG tablet Take 1 tablet (25 mcg total) by mouth daily before breakfast. 30 tablet 5   metoprolol  succinate (TOPROL -XL) 25 MG 24 hr tablet Take 1 tablet (25 mg total) by mouth daily. Take with or immediately following a meal. 90 tablet 3   spironolactone  (ALDACTONE ) 25 MG tablet Take 1 tablet (25 mg total) by mouth daily. 90 tablet 1   torsemide  (DEMADEX ) 20 MG tablet TAKE 1/2 TABLET BY MOUTH ONCE EVERY OTHER DAY 45 tablet 3   No current facility-administered medications for this visit.   Vitals:   07/15/24 0952  BP: 112/87  Pulse: (!) 59  SpO2: 99%  Weight: 200 lb (90.7 kg)    Wt Readings from Last 3 Encounters:  07/15/24 200 lb (90.7 kg)  06/10/24 201 lb 6.4 oz (91.4 kg)  05/04/24 197 lb 2 oz (89.4 kg)   Lab Results  Component Value Date   CREATININE 1.37 (H) 05/04/2024   CREATININE 1.26 (H) 04/08/2024   CREATININE 1.31 (H) 12/04/2023    PHYSICAL EXAM:  General: Well appearing.  Cor: No JVD.  Regular rhythm, bradycardic.  Lungs: clear Abdomen: soft, nontender, nondistended. Extremities: trace pitting edema bilateral lower legs. Right arm amputated Neuro:. Affect pleasant    ASSESSMENT & PLAN:  1: Chronic systolic CHF: Echo in 3/22 with EF 25-30%, moderate LVH.  Echo in 3/24 with EF 20-25%, LVH, mild-moderate Scott. Echo in 9/25 with EF 20-25%, G2DD, mildly reduced RV, mild-moderate Scott. HF etiology is uncertain. He has been noted to have a high burden of PVCs, Zio monitor in 5/25 showed 1281 runs of NSVT, longest 12.9 seconds; 18.3% PVCs. PVCs may contribute to cardiomyopathy. He has a LBBB, 162 msec. This additionally could contribute. He has refused ischemic evaluation with cardiac catheterization and generally has been reticent to take medications. Will need to try to titrate GDMT slowly to gain acceptance. He has some unusual ideas about the causation of his cardiac problems. NYHA Meyer II, euvolemic today - Continue Farxiga  10 mg daily.   - Continue metoprolol  succinate 12.5mg  daily. HR doesn't allow for titration - Continue spironolactone  25 mg daily.  - Continue torsemide  10 mg daily.  - Begin valsartan  20mg  daily. He doesn't want to take any meds BID - Trying to suppress his frequent PVCs with amiodarone , continue metoprolol  succinate 12.5mg  daily - He is not interested in invasive procedures including cardiac catheterization and ICD placement (would be candidate for CRT-D device with LBBB).  - Would like to do cardiac MRI in the future. Will discuss at next visit.  2: HTN: BP 112/87 3: PVCs: Very frequent, 18.3% on 5/25 Zio monitor with NSVT.   Continues with very frequent PVC's, 16.2% on 9/25 Zio monitor with NSVT. This may contribute to his cardiomyopathy. Continues on amiodarone .  - Continue amiodarone  200 mg daily.  - Continue metoprolol  succinate 12.5mg  daily - He should get a regular eye exam.  - TSH elevated 08/25 to 16.007. - Continue levothyroxine  . Recheck TSH today along with free T4 4: Hyperlipidemia: Continue atorvastatin  40mg  daily.  - LDL 03/25 was 76    Return in 1 month, sooner if needed.   I spent 30 minutes reviewing records, interviewing/ examing patient and managing plan/ orders.   Scott DELENA Class, FNP 07/15/24

## 2024-07-16 LAB — BASIC METABOLIC PANEL WITH GFR
BUN/Creatinine Ratio: 17 (ref 10–24)
BUN: 25 mg/dL (ref 8–27)
CO2: 23 mmol/L (ref 20–29)
Calcium: 9.9 mg/dL (ref 8.6–10.2)
Chloride: 99 mmol/L (ref 96–106)
Creatinine, Ser: 1.49 mg/dL — ABNORMAL HIGH (ref 0.76–1.27)
Glucose: 82 mg/dL (ref 70–99)
Potassium: 4.3 mmol/L (ref 3.5–5.2)
Sodium: 140 mmol/L (ref 134–144)
eGFR: 49 mL/min/1.73 — ABNORMAL LOW (ref >59)

## 2024-07-16 LAB — T4, FREE: Free T4: 0.58 ng/dL — ABNORMAL LOW (ref 0.82–1.77)

## 2024-07-16 LAB — TSH: TSH: 37.7 u[IU]/mL — ABNORMAL HIGH (ref 0.450–4.500)

## 2024-07-19 ENCOUNTER — Ambulatory Visit: Payer: Self-pay | Admitting: Family

## 2024-08-10 ENCOUNTER — Other Ambulatory Visit: Payer: Self-pay | Admitting: Cardiology

## 2024-08-13 ENCOUNTER — Other Ambulatory Visit: Payer: Self-pay | Admitting: Student

## 2024-08-13 DIAGNOSIS — I5022 Chronic systolic (congestive) heart failure: Secondary | ICD-10-CM

## 2024-08-13 DIAGNOSIS — I10 Essential (primary) hypertension: Secondary | ICD-10-CM

## 2024-08-17 LAB — FECAL OCCULT BLOOD, IMMUNOCHEMICAL: IFOBT: NEGATIVE

## 2024-08-17 NOTE — Telephone Encounter (Signed)
 Requested Prescriptions  Refused Prescriptions Disp Refills   spironolactone  (ALDACTONE ) 25 MG tablet [Pharmacy Med Name: SPIRONOLACTONE  25MG  TABLETS] 90 tablet 1    Sig: TAKE 1 TABLET(25 MG) BY MOUTH DAILY     Cardiovascular: Diuretics - Aldosterone Antagonist Failed - 08/17/2024  5:02 PM      Failed - Cr in normal range and within 180 days    Creatinine, Ser  Date Value Ref Range Status  07/15/2024 1.49 (H) 0.76 - 1.27 mg/dL Final         Passed - K in normal range and within 180 days    Potassium  Date Value Ref Range Status  07/15/2024 4.3 3.5 - 5.2 mmol/L Final         Passed - Na in normal range and within 180 days    Sodium  Date Value Ref Range Status  07/15/2024 140 134 - 144 mmol/L Final         Passed - eGFR is 30 or above and within 180 days    GFR calc Af Amer  Date Value Ref Range Status  06/16/2019 69 >59 mL/min/1.73 Final   GFR, Estimated  Date Value Ref Range Status  05/04/2024 54 (L) >60 mL/min Final    Comment:    (NOTE) Calculated using the CKD-EPI Creatinine Equation (2021)    eGFR  Date Value Ref Range Status  07/15/2024 49 (L) >59 mL/min/1.73 Final         Passed - Last BP in normal range    BP Readings from Last 1 Encounters:  07/15/24 112/87         Passed - Valid encounter within last 6 months    Recent Outpatient Visits           3 months ago Primary hypertension   [facility], [place]

## 2024-08-18 ENCOUNTER — Telehealth: Payer: Self-pay | Admitting: Family

## 2024-08-18 NOTE — Telephone Encounter (Signed)
 Called to confirm/remind patient of their appointment at the [facility] on 08/19/24.   Appointment:   [x] Confirmed  [] Left mess   [] No answer/No voice mail  [] VM Full/unable to leave message  [] Phone not in service  Patient reminded to bring all medications and/or complete list.  Confirmed patient has transportation. Gave directions, instructed to utilize valet parking.

## 2024-08-19 ENCOUNTER — Ambulatory Visit: Attending: Family | Admitting: Family

## 2024-08-19 ENCOUNTER — Encounter: Payer: Self-pay | Admitting: Family

## 2024-08-19 VITALS — BP 117/73 | HR 71 | Wt 209.6 lb

## 2024-08-19 DIAGNOSIS — E782 Mixed hyperlipidemia: Secondary | ICD-10-CM | POA: Diagnosis not present

## 2024-08-19 DIAGNOSIS — I493 Ventricular premature depolarization: Secondary | ICD-10-CM | POA: Diagnosis not present

## 2024-08-19 DIAGNOSIS — I10 Essential (primary) hypertension: Secondary | ICD-10-CM | POA: Diagnosis not present

## 2024-08-19 DIAGNOSIS — I5022 Chronic systolic (congestive) heart failure: Secondary | ICD-10-CM

## 2024-08-19 MED ORDER — VALSARTAN 40 MG PO TABS: 20.0000 mg | ORAL_TABLET | Freq: Every day | ORAL | 1 refills | Status: AC

## 2024-08-19 MED ORDER — SPIRONOLACTONE 25 MG PO TABS: 25.0000 mg | ORAL_TABLET | Freq: Every day | ORAL | 1 refills | Status: AC

## 2024-08-19 MED ORDER — LEVOTHYROXINE SODIUM 50 MCG PO TABS: 50.0000 ug | ORAL_TABLET | Freq: Every day | ORAL | 0 refills | Status: AC

## 2024-08-19 NOTE — Patient Instructions (Signed)
 Medication Changes:  INCREASE Levothyroxine  to 50mcg daily  START Valsartan  20mg  (1/2 tab) daily   Follow-Up in: Please follow up with the [facility] in 6 weeks with Ellouise Class, FNP.   Thank you for choosing [facility].    At the [facility], you and your health needs are our priority. We have a designated team specialized in the treatment of Heart Failure. This Care Team includes your primary Heart Failure Specialized Cardiologist (physician), Advanced Practice Providers (APPs- Physician Assistants and Nurse Practitioners), and Pharmacist who all work together to provide you with the care you need, when you need it.   You may see any of the following providers on your designated Care Team at your next follow up:  Dr. Toribio Fuel Dr. Ezra Shuck Dr. Ria Commander Dr. Morene Brownie Ellouise Class, FNP Jaun Bash, RPH-CPP  Please be sure to bring in all your medications bottles to every appointment.   Need to Contact Us :  If you have any questions or concerns before your next appointment please send us  a message through [place] or call our office at [phone].    TO LEAVE A MESSAGE FOR THE NURSE SELECT OPTION 2, PLEASE LEAVE A MESSAGE INCLUDING: YOUR NAME DATE OF BIRTH CALL BACK NUMBER REASON FOR CALL**this is important as we prioritize the call backs  YOU WILL RECEIVE A CALL BACK THE SAME DAY AS LONG AS YOU CALL BEFORE 4:00 PM

## 2024-08-19 NOTE — Progress Notes (Signed)
 [facility] Note    PCP: Lemon Raisin, MD HF Cardiology: Dr. Rolan  Chief Complaint: fatigue   HPI:  Mr Houseman is a 73 y.o. male with a history of HTN, frequent PVCs/NSVT, atrial tachycardia, right arm amputation, previous tobacco use and chronic systolic heart failure. Echo 3/22 showed EF 25-30% along with moderate LVH.   Admitted 11/18/22 due to SOB and increased pedal edema due to a/c HF exacerbation. Initially given IV lasix  with transition to oral diuretics. Echo 11/19/22 with EF 20-25% along with LVH along with Grade II DD and mild/moderate MR.      He has not had ischemic workup, has refused cardiac catheterization.  Not interested in any invasive procedures such as ICD placement.   Zio monitor in 5/25 showed 1281 runs of NSVT, longest 12.9 seconds; 18.3% PVCs. He has been started on amiodarone  to manage PVCs.   Seen in [facility] 07/15/24 where valsartan  20mg  daily was to be started. Thyroid  levels rechecked. Subsequently synthroid  was to be increased but staff was unable to reach patient.   He presents today for a HF follow-up visit with a chief complaint of fatigue. Has intermittent dizziness, pedal edema, intermittent dysuria and feeling like his bladder may not empty right but he's hoping that the full moon tonight will make him feel better. Isn't taking farxiga  daily because he doesn't like how it makes him feel, says that he's taking it ~ every other day. In looking at med dispense history, apparently valsartan  RX was never sent in .   ECG: not done  Labs (3/25): K 4.6, creatinine 1.3, LDL 76 Labs (7/25): K 3.8, creatinine 1.26, LFTs normal Labs (8/25): K 4.1, creatinine 1.37, BNP 401.9, TSH 16.007 Labs (10/25): K 4.3, creatinine 1.49, TSH 37.7, free T4 0.58  PMH: 1. HTN 2. CKD stage 3 3. 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 Chronic systolic CHF: Etiology uncertain.   - Echo (3/22): EF 25-30%, moderate LVH.  - Echo (3/24): EF 20-25%, LVH,  mild-moderate MR.  - Echo (9/25): EF 20-25%, G2DD, mildly reduced RV, mild-moderate MR  5. Frequent PVCs: Zio (5/25) with 1281 runs of NSVT, longest 12.9 seconds.  18.3% PVCs. - Zio (09/25) with 37 runs of NSVT, longest 7 beats, 16.2% PVC's 6. Hyperlipidemia  ROS: All systems negative except as listed in HPI, PMH and Problem List.  SH:  Social History   Socioeconomic History   Marital status: Single    Spouse name: Not on file   Number of children: Not on file   Years of education: Not on file   Highest education level: Not on file  Occupational History   Occupation: Retired  Tobacco Use   Smoking status: Former    Current packs/day: 0.00    Types: Cigarettes    Quit date: 10/09/2017    Years since quitting: 6.8   Smokeless tobacco: Never  Vaping Use   Vaping status: Never Used  Substance and Sexual Activity   Alcohol use: No    Comment: quit 8 months ago   Drug use: No   Sexual activity: Not on file  Other Topics Concern   Not on file  Social History Narrative   Lives in [place] w/ his sister.  Does not routinely exercise.   Social Drivers of Corporate Investment Banker Strain: Low Risk  (06/16/2024)   Overall Financial Resource Strain (CARDIA)    Difficulty of Paying Living Expenses: Not hard at all  Food Insecurity: No Food Insecurity (06/16/2024)  Hunger Vital Sign    Worried About Running Out of Food in the Last Year: Never true    Ran Out of Food in the Last Year: Never true  Transportation Needs: No Transportation Needs (06/16/2024)   PRAPARE - Administrator, Civil Service (Medical): No    Lack of Transportation (Non-Medical): No  Physical Activity: Insufficiently Active (06/16/2024)   Exercise Vital Sign    Days of Exercise per Week: 2 days    Minutes of Exercise per Session: 20 min  Stress: No Stress Concern Present (06/16/2024)   [employer] of Occupational Health - Occupational Stress Questionnaire    Feeling of Stress: Not at all   Social Connections: Socially Isolated (06/16/2024)   Social Connection and Isolation Panel    Frequency of Communication with Friends and Family: Never    Frequency of Social Gatherings with Friends and Family: Never    Attends Religious Services: Never    Database Administrator or Organizations: No    Attends Banker Meetings: Never    Marital Status: Never married  Intimate Partner Violence: Not At Risk (06/16/2024)   Humiliation, Afraid, Rape, and Kick questionnaire    Fear of Current or Ex-Partner: No    Emotionally Abused: No    Physically Abused: No    Sexually Abused: No    FH:  Family History  Problem Relation Age of Onset   Diabetes Mother    Cirrhosis Father    Alcohol abuse Father    Other Brother        gsw   Cancer Brother        'head full of tumors'   Other Brother        'killed w/ 2x4 to the head'   Other Sister        mva    Current Outpatient Medications  Medication Sig Dispense Refill   acetaminophen  (TYLENOL ) 500 MG tablet Take 500 mg by mouth every 6 (six) hours as needed for mild pain.     amiodarone  (PACERONE ) 200 MG tablet TAKE 1 TABLET(200 MG) BY MOUTH DAILY 90 tablet 1   aspirin  EC 325 MG tablet Take 325 mg by mouth every 4 (four) hours as needed for moderate pain (pain score 4-6) (for chest pain).     atorvastatin  (LIPITOR) 40 MG tablet TAKE 1 TABLET BY MOUTH ONCE DAILY. 90 tablet 0   FARXIGA  10 MG TABS tablet Take 1 tablet (10 mg total) by mouth daily before breakfast. 30 tablet 11   levothyroxine  (SYNTHROID ) 25 MCG tablet Take 1 tablet (25 mcg total) by mouth daily before breakfast. 30 tablet 5   metoprolol  succinate (TOPROL -XL) 25 MG 24 hr tablet Take 1 tablet (25 mg total) by mouth daily. Take with or immediately following a meal. (Patient taking differently: Take 12.5 mg by mouth daily. Take with or immediately following a meal.) 90 tablet 3   spironolactone  (ALDACTONE ) 25 MG tablet Take 1 tablet (25 mg total) by mouth daily. 90  tablet 1   torsemide  (DEMADEX ) 20 MG tablet Take 0.5 tablets (10 mg total) by mouth daily.     No current facility-administered medications for this visit.   Vitals:   08/19/24 1341  BP: 117/73  Pulse: 71  SpO2: 99%  Weight: 209 lb 9.6 oz (95.1 kg)   Wt Readings from Last 3 Encounters:  08/19/24 209 lb 9.6 oz (95.1 kg)  07/15/24 200 lb (90.7 kg)  06/10/24 201 lb 6.4 oz (91.4 kg)  Lab Results  Component Value Date   CREATININE 1.49 (H) 07/15/2024   CREATININE 1.37 (H) 05/04/2024   CREATININE 1.26 (H) 04/08/2024   PHYSICAL EXAM:  General: Well appearing.  Cor: No JVD. Regular rhythm, bradycardic.  Lungs: clear Abdomen: soft, nontender, nondistended. Extremities: trace pitting edema bilateral lower legs. Right arm amputated.  Neuro:. Affect pleasant although tangential in thought   ASSESSMENT & PLAN:  1: Chronic systolic CHF: Echo in 3/22 with EF 25-30%, moderate LVH.  Echo in 3/24 with EF 20-25%, LVH, mild-moderate MR. Echo in 9/25 with EF 20-25%, G2DD, mildly reduced RV, mild-moderate MR. HF etiology is uncertain. He has been noted to have a high burden of PVCs, Zio monitor in 5/25 showed 1281 runs of NSVT, longest 12.9 seconds; 18.3% PVCs. PVCs may contribute to cardiomyopathy. He has a LBBB, 162 msec. This additionally could contribute. He has refused ischemic evaluation with cardiac catheterization and generally has been reticent to take medications. Will need to try to titrate GDMT slowly to gain acceptance. He has some unusual ideas about the causation of his cardiac problems. NYHA class II, euvolemic today - Continue Farxiga  10 mg daily.   - Continue metoprolol  succinate 12.5mg  daily. HR doesn't allow for titration - Continue spironolactone  25 mg daily.  - Continue torsemide  10 mg daily.  - Begin valsartan  20mg  daily as RX wasn't sent in at last visit. He doesn't want to take any meds BID. BMET next visit - Trying to suppress his frequent PVCs with amiodarone , continue  metoprolol  succinate 12.5mg  daily - He is not interested in invasive procedures including cardiac catheterization and ICD placement (would be candidate for CRT-D device with LBBB).  - Would like to do cardiac MRI in the future but he's currently not intersted 2: HTN: BP 117/73 3: PVCs: Very frequent, 18.3% on 5/25 Zio monitor with NSVT.  Continues with very frequent PVC's, 16.2% on 9/25 Zio monitor with NSVT. This may contribute to his cardiomyopathy. Continues on amiodarone .  - Continue amiodarone  200 mg daily.  - Continue metoprolol  succinate 12.5mg  daily - He should get a regular eye exam.  - TSH elevated 10/25 to 37.7 although improving - Increase levothyroxine  to 50mcg.  4: Hyperlipidemia: Continue atorvastatin  40mg  daily.  - LDL 03/25 was 76  Has PCP appointment next week and emphasized that he keep that appointment to discuss urology issues with her. Will reach out to PCP to let her know his concerns.    Return in 6 weeks, sooner if needed.   I spent 38 minutes reviewing records, interviewing/ examing patient and managing plan/ orders.   Ellouise DELENA Class, FNP-C 08/19/24

## 2024-08-24 ENCOUNTER — Encounter: Payer: Self-pay | Admitting: Student

## 2024-08-24 ENCOUNTER — Ambulatory Visit: Admitting: Student

## 2024-08-24 VITALS — BP 110/70 | HR 47 | Ht 75.0 in | Wt 208.0 lb

## 2024-08-24 DIAGNOSIS — R351 Nocturia: Secondary | ICD-10-CM

## 2024-08-24 DIAGNOSIS — Z23 Encounter for immunization: Secondary | ICD-10-CM

## 2024-08-24 DIAGNOSIS — R300 Dysuria: Secondary | ICD-10-CM

## 2024-08-24 NOTE — Progress Notes (Unsigned)
 Established Patient Office Visit  Subjective   Patient ID: Scott Meyer, male    DOB: 11/11/1950  Age: 73 y.o. MRN: [id number]  Chief Complaint  Patient presents with   Annual Exam    Scott Meyer is a 73 y.o. person with medical hx listed below who presents today for dysuria with urination since July. Reports increase frequency which he attributes his heart failure medications. In the last 2 days feels urine is a darker color. Denies fevers, chills, back pain, hematuria.  Reports waking up 3-4 times a night to urinate.   {History (Optional):23778}  ROS Refer to HPI    Objective:     Outpatient Encounter Medications as of 08/24/2024  Medication Sig Note   acetaminophen  (TYLENOL ) 500 MG tablet Take 500 mg by mouth every 6 (six) hours as needed for mild pain. 02/05/2024: PRN: only couple times per month   amiodarone  (PACERONE ) 200 MG tablet TAKE 1 TABLET(200 MG) BY MOUTH DAILY    aspirin  EC 325 MG tablet Take 325 mg by mouth every 4 (four) hours as needed for moderate pain (pain score 4-6) (for chest pain).    atorvastatin  (LIPITOR) 40 MG tablet TAKE 1 TABLET BY MOUTH ONCE DAILY.    FARXIGA  10 MG TABS tablet Take 1 tablet (10 mg total) by mouth daily before breakfast.    levothyroxine  (SYNTHROID ) 50 MCG tablet Take 1 tablet (50 mcg total) by mouth daily before breakfast.    metoprolol  succinate (TOPROL -XL) 25 MG 24 hr tablet Take 1 tablet (25 mg total) by mouth daily. Take with or immediately following a meal. (Patient taking differently: Take 12.5 mg by mouth daily. Take with or immediately following a meal.)    torsemide  (DEMADEX ) 20 MG tablet Take 0.5 tablets (10 mg total) by mouth daily.    spironolactone  (ALDACTONE ) 25 MG tablet Take 1 tablet (25 mg total) by mouth daily. (Patient not taking: Reported on 08/24/2024)    valsartan  (DIOVAN ) 40 MG tablet Take 0.5 tablets (20 mg total) by mouth daily. (Patient not taking: Reported on 08/24/2024)    No facility-administered encounter  medications on file as of 08/24/2024.    BP 110/70   Pulse (!) 47   Ht 6' 3 (1.905 m)   Wt 208 lb (94.3 kg)   SpO2 99%   BMI 26.00 kg/m  BP Readings from Last 3 Encounters:  08/24/24 110/70  08/19/24 117/73  07/15/24 112/87    Physical Exam     08/24/2024    3:07 PM 06/16/2024    1:27 PM 04/20/2024    3:15 PM  Depression screen PHQ 2/9  Decreased Interest 0 0 1  Down, Depressed, Hopeless 0 0 1  PHQ - 2 Score 0 0 2  Altered sleeping  0 0  Tired, decreased energy  0 1  Change in appetite  0 2  Feeling bad or failure about yourself   0 0  Trouble concentrating  0 1  Moving slowly or fidgety/restless  0 1  Suicidal thoughts  0 0  PHQ-9 Score  0  7   Difficult doing work/chores  Not difficult at all Somewhat difficult     Data saved with a previous flowsheet row definition       08/24/2024    3:07 PM 04/20/2024    3:15 PM 10/09/2023    2:08 PM 01/23/2023   10:29 AM  GAD 7 : Generalized Anxiety Score  Nervous, Anxious, on Edge 0 1 1 0  Control/stop worrying 0 1  2 0  Worry too much - different things  0 2 0  Trouble relaxing  1 0 0  Restless  0 1 0  Easily annoyed or irritable  0 0 0  Afraid - awful might happen  0 1 0  Total GAD 7 Score  3 7 0  Anxiety Difficulty  Somewhat difficult Somewhat difficult Not difficult at all    Results for orders placed or performed in visit on 08/24/24  Fecal occult blood, imunochemical  Result Value Ref Range   IFOBT Negative     {Labs (Optional):23779}  The ASCVD Risk score (Arnett DK, et al., 2019) failed to calculate for the following reasons:   The valid total cholesterol range is 130 to 320 mg/dL    Assessment & Plan:  Encounter for immunization -     Flu vaccine HIGH DOSE PF(Fluzone Trivalent)     No follow-ups on file.    Scott Saddler, MD

## 2024-08-25 LAB — PSA

## 2024-08-25 LAB — MICROSCOPIC EXAMINATION
Bacteria, UA: NONE SEEN
Casts: NONE SEEN /LPF
Epithelial Cells (non renal): NONE SEEN /HPF (ref 0–10)
RBC, Urine: NONE SEEN /HPF (ref 0–2)

## 2024-08-25 LAB — URINALYSIS, ROUTINE W REFLEX MICROSCOPIC
Glucose, UA: NEGATIVE
Nitrite, UA: NEGATIVE
RBC, UA: NEGATIVE
Specific Gravity, UA: 1.021 (ref 1.005–1.030)
Urobilinogen, Ur: 4 mg/dL — ABNORMAL HIGH (ref 0.2–1.0)
pH, UA: 6 (ref 5.0–7.5)

## 2024-08-26 ENCOUNTER — Ambulatory Visit: Payer: Self-pay | Admitting: Student

## 2024-08-26 LAB — URINE CULTURE

## 2024-08-27 ENCOUNTER — Other Ambulatory Visit: Payer: Self-pay | Admitting: Student

## 2024-08-27 DIAGNOSIS — R351 Nocturia: Secondary | ICD-10-CM | POA: Insufficient documentation

## 2024-08-27 DIAGNOSIS — R300 Dysuria: Secondary | ICD-10-CM | POA: Insufficient documentation

## 2024-08-31 LAB — COMPREHENSIVE METABOLIC PANEL WITH GFR
ALT: 28 IU/L (ref 0–44)
AST: 31 IU/L (ref 0–40)
Albumin: 4.2 g/dL (ref 3.8–4.8)
Alkaline Phosphatase: 97 IU/L (ref 47–123)
BUN/Creatinine Ratio: 13 (ref 10–24)
BUN: 20 mg/dL (ref 8–27)
Bilirubin Total: 2.4 mg/dL — ABNORMAL HIGH (ref 0.0–1.2)
CO2: 21 mmol/L (ref 20–29)
Calcium: 9.4 mg/dL (ref 8.6–10.2)
Chloride: 100 mmol/L (ref 96–106)
Creatinine, Ser: 1.51 mg/dL — ABNORMAL HIGH (ref 0.76–1.27)
Globulin, Total: 2.4 g/dL (ref 1.5–4.5)
Glucose: 100 mg/dL — ABNORMAL HIGH (ref 70–99)
Potassium: 4.4 mmol/L (ref 3.5–5.2)
Sodium: 139 mmol/L (ref 134–144)
Total Protein: 6.6 g/dL (ref 6.0–8.5)
eGFR: 48 mL/min/1.73 — ABNORMAL LOW (ref >59)

## 2024-08-31 LAB — PSA: Prostate Specific Ag, Serum: 0.5 ng/mL (ref 0.0–4.0)

## 2024-08-31 NOTE — Progress Notes (Signed)
 Called patient. No answer. VM full.   Scott Meyer

## 2024-09-01 NOTE — Progress Notes (Signed)
 Spoke with patient and he verbalized understanding. He said he is taking AZO cranberry supplement right now and it seems to help. He will call back if he wants referral to urology.  JM

## 2024-09-21 ENCOUNTER — Encounter: Payer: Self-pay | Admitting: Student

## 2024-09-21 ENCOUNTER — Ambulatory Visit (INDEPENDENT_AMBULATORY_CARE_PROVIDER_SITE_OTHER): Admitting: Student

## 2024-09-21 VITALS — BP 106/68 | HR 48 | Ht 75.0 in | Wt 208.2 lb

## 2024-09-21 DIAGNOSIS — Z5320 Procedure and treatment not carried out because of patient's decision for unspecified reasons: Secondary | ICD-10-CM | POA: Diagnosis not present

## 2024-09-21 DIAGNOSIS — E039 Hypothyroidism, unspecified: Secondary | ICD-10-CM | POA: Diagnosis not present

## 2024-09-21 DIAGNOSIS — Z0000 Encounter for general adult medical examination without abnormal findings: Secondary | ICD-10-CM | POA: Diagnosis not present

## 2024-09-21 MED ORDER — METOPROLOL SUCCINATE ER 25 MG PO TB24: 12.5000 mg | ORAL_TABLET | Freq: Every day | ORAL | 1 refills | Status: DC

## 2024-09-24 ENCOUNTER — Telehealth: Payer: Self-pay

## 2024-09-24 NOTE — Progress Notes (Unsigned)
 Care Guide Pharmacy Note  09/24/2024 Name: Scott Meyer MRN: [id number] DOB: 1951-03-19  Referred By: Lemon Raisin, MD Reason for referral: Complex Care Management (Outreach to schedule with pharm d )   Scott Meyer is a 75 y.o. year old male who is a primary care patient of Lemon Raisin, MD.  Scott Meyer was referred to the pharmacist for assistance related to: hypothyroid  An unsuccessful telephone outreach was attempted today to contact the patient who was referred to the pharmacy team for assistance with medication management. Additional attempts will be made to contact the patient.  Jeoffrey Buffalo , RMA     [facility]  [facility], [name] Guide  Direct Dial: [phone]  Website: [email or web address]

## 2024-09-25 NOTE — Assessment & Plan Note (Signed)
 Noted not to have his thyroid  medication in pill packs levothyroxine  was recently increased to 50mcg due to elevated TSH to 37. He mistook levothyroxine  as a duplicate of one of his other medications. Is taking morning meds all at one. Discussed need to take levothyroxine  1 hour prior to food or other medications. He understands. Will make VBCI referral to pharmacy to help assist with medication adherence

## 2024-09-25 NOTE — Assessment & Plan Note (Addendum)
 Declined labs today Normal PSA last month Declined lung cancer screening UTD on flu vaccine Colon cancer screening: negative annual iFOBT

## 2024-09-25 NOTE — Progress Notes (Signed)
 "  Complete physical exam  Patient: Scott Meyer   DOB: [birth year]   74 y.o. Male  MRN: [id number]  Subjective:    Chief Complaint  Patient presents with   Annual Exam    Scott Meyer is a 74 y.o. male who presents today for a complete physical exam. He reports consuming a general diet. The patient does not participate in regular exercise at present. He generally feels well. He reports sleeping fairly well. He does not have additional problems to discuss today.      Patient Active Problem List   Diagnosis Date Noted   Hypothyroidism 09/21/2024   Lung cancer screening declined by patient 09/21/2024   Annual physical exam 09/21/2024   Dysuria 08/27/2024   Nocturia 08/27/2024   Anasarca 11/18/2022   Atrial tachycardia 11/30/2020   Frequent PVCs 11/30/2020   Hypertension 11/29/2020   HLD (hyperlipidemia) 11/29/2020   Amputation of right arm (HCC) 11/29/2020   Chronic systolic heart failure (HCC) 11/29/2020      Patient Care Team: Lemon Raisin, MD as PCP - General (Internal Medicine) End, Lonni, MD as PCP - Cardiology (Cardiology)   Show/hide medication list[1]  ROS Refer to HPI     Objective:    BP 106/68   Pulse (!) 48   Ht 6' 3 (1.905 m)   Wt 208 lb 4 oz (94.5 kg)   SpO2 97%   BMI 26.03 kg/m  BP Readings from Last 3 Encounters:  09/21/24 106/68  08/24/24 110/70  08/19/24 117/73    Physical Exam Constitutional:      Appearance: Normal appearance.  HENT:     Right Ear: Tympanic membrane normal.     Left Ear: Tympanic membrane normal.     Mouth/Throat:     Mouth: Mucous membranes are moist.     Pharynx: Oropharynx is clear.  Eyes:     Extraocular Movements: Extraocular movements intact.     Pupils: Pupils are equal, round, and reactive to light.  Cardiovascular:     Rate and Rhythm: Regular rhythm. Bradycardia present.  Pulmonary:     Effort: Pulmonary effort is normal.     Breath sounds: No rhonchi or rales.  Abdominal:     General:  Abdomen is flat. Bowel sounds are normal. There is no distension.     Palpations: Abdomen is soft.     Tenderness: There is no abdominal tenderness.  Musculoskeletal:     Cervical back: Normal range of motion and neck supple.     Right lower leg: No edema.     Left lower leg: No edema.     Comments: Amputation of RUE  Skin:    General: Skin is warm and dry.     Capillary Refill: Capillary refill takes less than 2 seconds.  Neurological:     General: No focal deficit present.     Mental Status: He is alert and oriented to person, place, and time.  Psychiatric:        Mood and Affect: Mood normal.        Behavior: Behavior normal.         09/21/2024    1:14 PM 08/24/2024    3:07 PM 06/16/2024    1:27 PM  Depression screen PHQ 2/9  Decreased Interest 0 0 0  Down, Depressed, Hopeless 0 0 0  PHQ - 2 Score 0 0 0  Altered sleeping   0  Tired, decreased energy   0  Change in appetite   0  Feeling  bad or failure about yourself    0  Trouble concentrating   0  Moving slowly or fidgety/restless   0  Suicidal thoughts   0  PHQ-9 Score   0   Difficult doing work/chores   Not difficult at all     Data saved with a previous flowsheet row definition      09/21/2024    1:14 PM 08/24/2024    3:07 PM 04/20/2024    3:15 PM 10/09/2023    2:08 PM  GAD 7 : Generalized Anxiety Score  Nervous, Anxious, on Edge 0 0 1 1  Control/stop worrying 0 0 1 2  Worry too much - different things   0 2  Trouble relaxing   1 0  Restless   0 1  Easily annoyed or irritable   0 0  Afraid - awful might happen   0 1  Total GAD 7 Score   3 7  Anxiety Difficulty   Somewhat difficult Somewhat difficult    No results found for any visits on 09/21/24. Last CBC Lab Results  Component Value Date   WBC 6.0 11/28/2022   HGB 14.0 11/28/2022   HCT 42.5 11/28/2022   MCV 93 11/28/2022   MCH 30.7 11/28/2022   RDW 11.6 11/28/2022   PLT 143 (L) 11/28/2022   Last metabolic panel Lab Results  Component Value Date    GLUCOSE 100 (H) 08/30/2024   NA 139 08/30/2024   K 4.4 08/30/2024   CL 100 08/30/2024   CO2 21 08/30/2024   BUN 20 08/30/2024   CREATININE 1.51 (H) 08/30/2024   EGFR 48 (L) 08/30/2024   CALCIUM  9.4 08/30/2024   PHOS 2.7 06/13/2021   PROT 6.6 08/30/2024   ALBUMIN 4.2 08/30/2024   LABGLOB 2.4 08/30/2024   BILITOT 2.4 (H) 08/30/2024   ALKPHOS 97 08/30/2024   AST 31 08/30/2024   ALT 28 08/30/2024   ANIONGAP 9 05/04/2024   Last lipids Lab Results  Component Value Date   CHOL 129 12/04/2023   HDL 42 12/04/2023   LDLCALC 76 12/04/2023   TRIG 47 12/04/2023   CHOLHDL 3.1 12/04/2023   Last hemoglobin A1c Lab Results  Component Value Date   HGBA1C 5.5 11/19/2022   Last thyroid  functions Lab Results  Component Value Date   TSH 37.700 (H) 07/15/2024   FREET4 0.58 (L) 07/15/2024        Assessment & Plan:    Routine Health Maintenance and Physical Exam  Health Maintenance  Topic Date Due   Colon Cancer Screening  Never done   Screening for Lung Cancer  Never done   Zoster (Shingles) Vaccine (1 of 2) Never done   COVID-19 Vaccine (1 - 2025-26 season) 09/09/2025*   Medicare Annual Wellness Visit  06/16/2025   Stool Blood Test  08/17/2025   Pneumococcal Vaccine for age over 38  Completed   Flu Shot  Completed   Hepatitis C Screening  Completed   Meningitis B Vaccine  Aged Out   DTaP/Tdap/Td vaccine  Discontinued  *Topic was postponed. The date shown is not the original due date.    Discussed health benefits of physical activity, and encouraged him to engage in regular exercise appropriate for his age and condition.  Hypothyroidism, unspecified type Assessment & Plan: Noted not to have his thyroid  medication in pill packs levothyroxine  was recently increased to 50mcg due to elevated TSH to 37. He mistook levothyroxine  as a duplicate of one of his other medications. Is taking morning  meds all at one. Discussed need to take levothyroxine  1 hour prior to food or other  medications. He understands. Will make VBCI referral to pharmacy to help assist with medication adherence   Orders: -     AMB Referral VBCI Care Management  Lung cancer screening declined by patient  Annual physical exam Assessment & Plan: Declined labs today Normal PSA last month Declined lung cancer screening UTD on flu vaccine Colon cancer screening: negative annual iFOBT   Other orders -     Metoprolol  Succinate ER; Take 0.5 tablets (12.5 mg total) by mouth daily. Take with or immediately following a meal.  Dispense: 90 tablet; Refill: 1    No follow-ups on file.     Harlene Saddler, MD    [1]  Outpatient Medications Prior to Visit  Medication Sig   acetaminophen  (TYLENOL ) 500 MG tablet Take 500 mg by mouth every 6 (six) hours as needed for mild pain.   amiodarone  (PACERONE ) 200 MG tablet TAKE 1 TABLET(200 MG) BY MOUTH DAILY   aspirin  EC 325 MG tablet Take 325 mg by mouth every 4 (four) hours as needed for moderate pain (pain score 4-6) (for chest pain).   atorvastatin  (LIPITOR) 40 MG tablet TAKE 1 TABLET BY MOUTH ONCE DAILY.   FARXIGA  10 MG TABS tablet Take 1 tablet (10 mg total) by mouth daily before breakfast.   levothyroxine  (SYNTHROID ) 50 MCG tablet Take 1 tablet (50 mcg total) by mouth daily before breakfast.   spironolactone  (ALDACTONE ) 25 MG tablet Take 1 tablet (25 mg total) by mouth daily.   torsemide  (DEMADEX ) 20 MG tablet Take 0.5 tablets (10 mg total) by mouth daily.   valsartan  (DIOVAN ) 40 MG tablet Take 0.5 tablets (20 mg total) by mouth daily.   [DISCONTINUED] metoprolol  succinate (TOPROL -XL) 25 MG 24 hr tablet Take 1 tablet (25 mg total) by mouth daily. Take with or immediately following a meal. (Patient taking differently: Take 12.5 mg by mouth daily. Take with or immediately following a meal.)   No facility-administered medications prior to visit.   "

## 2024-09-29 ENCOUNTER — Telehealth: Payer: Self-pay | Admitting: Family

## 2024-09-29 NOTE — Progress Notes (Signed)
 "  Advanced Heart Failure Clinic Note    PCP: Lemon Raisin, MD HF Cardiology: Dr. Rolan  Chief Complaint: shortness of breath   HPI:  Scott Meyer is a 74 y.o. male with a history of HTN, frequent PVCs/NSVT, atrial tachycardia, right arm amputation, previous tobacco use and chronic systolic heart failure. Echo 3/22 showed EF 25-30% along with moderate LVH.   Admitted 11/18/22 due to SOB and increased pedal edema due to a/c HF exacerbation. Initially given IV lasix  with transition to oral diuretics. Echo 11/19/22 with EF 20-25% along with LVH along with Grade II DD and mild/moderate Scott.      He has not had ischemic workup, has refused cardiac catheterization.  Not interested in any invasive procedures such as ICD placement.   Zio monitor in 5/25 showed 1281 runs of NSVT, longest 12.9 seconds; 18.3% PVCs. He has been started on amiodarone  to manage PVCs.   Seen in [facility] 07/15/24 where valsartan  20mg  daily was to be started. Thyroid  levels rechecked. Subsequently synthroid  was to be increased but staff was unable to reach patient.   Seen in [facility] 08/19/24 where valsartan  was started. Levothyroxine  was increased to 50 mcg daily.    He presents today for a HF follow-up visit with a chief complaint of minimal shortness of breath. Has associated fatigue, minimal pedal edema and occasional difficulty urinating. Denies any chest pain, palpitations, abdominal distention, dizziness. Has been doing some walking for exercise. When reviewing his medication bottles he has been taking both 50mcg & 25mcg thyroid  daily. He's also been taking a whole 25mg  toprol  daily instead of 1/2 tablet. He's taking his torsemide  intermittently because of frequent urination. He says that he took 1/2 tablet of torsemide  yesterday as well as this morning. Prior to that, was almost a week ago. He says that he feels a shield in his chest which he says is a good thing.   ECG: not done  Labs (3/25): K 4.6, creatinine 1.3, LDL 76 Labs  (7/25): K 3.8, creatinine 1.26, LFTs normal Labs (8/25): K 4.1, creatinine 1.37, BNP 401.9, TSH 16.007 Labs (10/25): K 4.3, creatinine 1.49, TSH 37.7, free T4 0.58 Labs (12/25): K 4.4, creatinine 1.51, LFT's normal   PMH: 1. HTN 2. CKD stage 3 3. 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 Chronic systolic CHF: Etiology uncertain.   - Echo (3/22): EF 25-30%, moderate LVH.  - Echo (3/24): EF 20-25%, LVH, mild-moderate Scott.  - Echo (9/25): EF 20-25%, G2DD, mildly reduced RV, mild-moderate Scott  5. Frequent PVCs: Zio (5/25) with 1281 runs of NSVT, longest 12.9 seconds.  18.3% PVCs. - Zio (09/25) with 37 runs of NSVT, longest 7 beats, 16.2% PVC's 6. Hyperlipidemia  ROS: All systems negative except as listed in HPI, PMH and Problem List.  SH:  Social History   Socioeconomic History   Marital status: Single    Spouse name: Not on file   Number of children: Not on file   Years of education: Not on file   Highest education level: Not on file  Occupational History   Occupation: Retired  Tobacco Use   Smoking status: Former    Current packs/day: 0.00    Average packs/day: 0.5 packs/day for 51.1 years (25.5 ttl pk-yrs)    Types: Cigarettes    Start date: 23    Quit date: 10/09/2017    Years since quitting: 6.9   Smokeless tobacco: Never  Vaping Use   Vaping status: Never Used  Substance and Sexual Activity  Alcohol use: No    Comment: quit 8 months ago   Drug use: No   Sexual activity: Not on file  Other Topics Concern   Not on file  Social History Narrative   Lives in [place] w/ his sister.  Does not routinely exercise.   Social Drivers of Health   Tobacco Use: Medium Risk (09/21/2024)   Patient History    Smoking Tobacco Use: Former    Smokeless Tobacco Use: Never    Passive Exposure: Not on file  Financial Resource Strain: Low Risk (06/16/2024)   Overall Financial Resource Strain (CARDIA)    Difficulty of Paying Living Expenses: Not hard at all  Food Insecurity: No Food  Insecurity (06/16/2024)   Epic    Worried About Programme Researcher, Broadcasting/film/video in the Last Year: Never true    Ran Out of Food in the Last Year: Never true  Transportation Needs: No Transportation Needs (06/16/2024)   Epic    Lack of Transportation (Medical): No    Lack of Transportation (Non-Medical): No  Physical Activity: Insufficiently Active (06/16/2024)   Exercise Vital Sign    Days of Exercise per Week: 2 days    Minutes of Exercise per Session: 20 min  Stress: No Stress Concern Present (06/16/2024)   [employer] of Occupational Health - Occupational Stress Questionnaire    Feeling of Stress: Not at all  Social Connections: Socially Isolated (06/16/2024)   Social Connection and Isolation Panel    Frequency of Communication with Friends and Family: Never    Frequency of Social Gatherings with Friends and Family: Never    Attends Religious Services: Never    Database Administrator or Organizations: No    Attends Banker Meetings: Never    Marital Status: Never married  Intimate Partner Violence: Not At Risk (06/16/2024)   Epic    Fear of Current or Ex-Partner: No    Emotionally Abused: No    Physically Abused: No    Sexually Abused: No  Depression (PHQ2-9): Low Risk (09/21/2024)   Depression (PHQ2-9)    PHQ-2 Score: 0  Alcohol Screen: Low Risk (06/16/2024)   Alcohol Screen    Last Alcohol Screening Score (AUDIT): 0  Housing: Unknown (06/16/2024)   Epic    Unable to Pay for Housing in the Last Year: No    Number of Times Moved in the Last Year: Not on file    Homeless in the Last Year: No  Utilities: Not At Risk (06/16/2024)   Epic    Threatened with loss of utilities: No  Health Literacy: Adequate Health Literacy (06/16/2024)   B1300 Health Literacy    Frequency of need for help with medical instructions: Never    FH:  Family History  Problem Relation Age of Onset   Diabetes Mother    Cirrhosis Father    Alcohol abuse Father    Other Brother        gsw    Cancer Brother        'head full of tumors'   Other Brother        'killed w/ 2x4 to the head'   Other Sister        mva    Current Outpatient Medications  Medication Sig Dispense Refill   acetaminophen  (TYLENOL ) 500 MG tablet Take 500 mg by mouth every 6 (six) hours as needed for mild pain.     amiodarone  (PACERONE ) 200 MG tablet TAKE 1 TABLET(200 MG) BY MOUTH DAILY 90 tablet  1   aspirin  EC 325 MG tablet Take 325 mg by mouth every 4 (four) hours as needed for moderate pain (pain score 4-6) (for chest pain).     atorvastatin  (LIPITOR) 40 MG tablet TAKE 1 TABLET BY MOUTH ONCE DAILY. 90 tablet 0   FARXIGA  10 MG TABS tablet Take 1 tablet (10 mg total) by mouth daily before breakfast. 30 tablet 11   levothyroxine  (SYNTHROID ) 50 MCG tablet Take 1 tablet (50 mcg total) by mouth daily before breakfast. 90 tablet 0   metoprolol  succinate (TOPROL -XL) 25 MG 24 hr tablet Take 0.5 tablets (12.5 mg total) by mouth daily. Take with or immediately following a meal. 90 tablet 1   spironolactone  (ALDACTONE ) 25 MG tablet Take 1 tablet (25 mg total) by mouth daily. 90 tablet 1   torsemide  (DEMADEX ) 20 MG tablet Take 0.5 tablets (10 mg total) by mouth daily.     valsartan  (DIOVAN ) 40 MG tablet Take 0.5 tablets (20 mg total) by mouth daily. 45 tablet 1   No current facility-administered medications for this visit.   Vitals:   09/30/24 1325  BP: 116/64  Pulse: (!) 46  SpO2: 100%  Weight: 211 lb 6.4 oz (95.9 kg)   Wt Readings from Last 3 Encounters:  09/30/24 211 lb 6.4 oz (95.9 kg)  09/21/24 208 lb 4 oz (94.5 kg)  08/24/24 208 lb (94.3 kg)   Lab Results  Component Value Date   CREATININE 1.51 (H) 08/30/2024   CREATININE 1.49 (H) 07/15/2024   CREATININE 1.37 (H) 05/04/2024     PHYSICAL EXAM:  General: Well appearing.  Cor: No JVD. Regular rhythm, bradycardic.  Lungs: clear Abdomen: soft, nontender, nondistended. Extremities:  trace pitting edema bilateral lower legs. Right arm amputated.   Neuro:. Affect pleasant. Thought process is tangential.    ASSESSMENT & PLAN:  1: Chronic systolic CHF: Echo in 3/22 with EF 25-30%, moderate LVH.  Echo in 3/24 with EF 20-25%, LVH, mild-moderate Scott. Echo in 9/25 with EF 20-25%, G2DD, mildly reduced RV, mild-moderate Scott. HF etiology is uncertain. He has been noted to have a high burden of PVCs, Zio monitor in 5/25 showed 1281 runs of NSVT, longest 12.9 seconds; 18.3% PVCs. PVCs may contribute to cardiomyopathy. He has a LBBB, 162 msec. This additionally could contribute. He has refused ischemic evaluation with cardiac catheterization and generally has been reticent to take medications. Will need to try to titrate GDMT slowly to gain acceptance. He has some unusual ideas about the causation of his cardiac problems. NYHA class II, euvolemic today - Continue Farxiga  10 mg daily.   - Decrease metoprolol  succinate to 12.5mg  daily as he's been taking a whole 25mg  tablet. HR today is 46. - Continue spironolactone  25 mg daily.  - Continue torsemide  10 mg daily although he's been taking it a few times a week due to frequent urination - Continue valsartan  20mg  daily.  - Trying to suppress his frequent PVCs with amiodarone , continue metoprolol  succinate 12.5mg  daily - He is not interested in invasive procedures including cardiac catheterization and ICD placement (would be candidate for CRT-D device with LBBB).  - Would like to do cardiac MRI in the future but he's currently not intersted 2: HTN: BP 116/64 - BMET 08/30/24 reviewed: K 4.4, creatinine 1.51, GFR 48 3: PVCs: Very frequent, 18.3% on 5/25 Zio monitor with NSVT.  Continues with very frequent PVC's, 16.2% on 9/25 Zio monitor with NSVT. This may contribute to his cardiomyopathy. Continues on amiodarone .  - Continue amiodarone   200 mg daily.  - Continue metoprolol  succinate 12.5mg  daily - He should get a regular eye exam.  - TSH elevated 10/25 to 37.7 although improving.   - Continue levothyroxine   . He has been taking both doses of levothyroxine . His old bottle of 25mg  synthroid  taken, with his permission, to be destroyed. 4: Hyperlipidemia: Continue atorvastatin  40mg  daily.  - LDL 03/25 was 76   Return in 4 months, sooner if needed.   I spent 35 minutes reviewing records, interviewing/ examing patient and managing plan/ orders.    Ellouise DELENA Class, FNP-C 09/29/2024  "

## 2024-09-29 NOTE — Progress Notes (Signed)
 Care Guide Pharmacy Note  09/29/2024 Name: [name] MRN: [id number] DOB: 08-31-1951  Referred By: Lemon Raisin, MD Reason for referral: Complex Care Management (Outreach to schedule with pharm d )   Scott Meyer is a 74 y.o. year old male who is a primary care patient of Lemon Raisin, MD.  Trystan Akhtar was referred to the pharmacist for assistance related to: hypothyroid  Successful contact was made with the patient to discuss pharmacy services including being ready for the pharmacist to call at least 5 minutes before the scheduled appointment time and to have medication bottles and any blood pressure readings ready for review. The patient agreed to meet with the pharmacist via telephone visit on (date/time).10/06/2024  Jeoffrey Buffalo , RMA     [place]  [facility], [facility] Guide  Direct Dial: [phone]  Website: [email or web address]

## 2024-09-29 NOTE — Telephone Encounter (Signed)
 Called to confirm/remind patient of their appointment at the [facility] on 09/30/24.   Appointment:   [] Confirmed  [] Left mess   [] No answer/No voice mail  [x] VM Full/unable to leave message  [] Phone not in service  Patient reminded to bring all medications and/or complete list.  Confirmed patient has transportation. Gave directions, instructed to utilize valet parking.

## 2024-09-30 ENCOUNTER — Ambulatory Visit: Attending: Family | Admitting: Family

## 2024-09-30 ENCOUNTER — Encounter: Payer: Self-pay | Admitting: Family

## 2024-09-30 VITALS — BP 116/64 | HR 46 | Wt 211.4 lb

## 2024-09-30 DIAGNOSIS — R0602 Shortness of breath: Secondary | ICD-10-CM | POA: Diagnosis present

## 2024-09-30 DIAGNOSIS — I5022 Chronic systolic (congestive) heart failure: Secondary | ICD-10-CM | POA: Diagnosis not present

## 2024-09-30 DIAGNOSIS — Z7982 Long term (current) use of aspirin: Secondary | ICD-10-CM | POA: Diagnosis not present

## 2024-09-30 DIAGNOSIS — Z7984 Long term (current) use of oral hypoglycemic drugs: Secondary | ICD-10-CM | POA: Diagnosis not present

## 2024-09-30 DIAGNOSIS — E782 Mixed hyperlipidemia: Secondary | ICD-10-CM | POA: Diagnosis not present

## 2024-09-30 DIAGNOSIS — Z89201 Acquired absence of right upper limb, unspecified level: Secondary | ICD-10-CM | POA: Diagnosis not present

## 2024-09-30 DIAGNOSIS — Z87891 Personal history of nicotine dependence: Secondary | ICD-10-CM | POA: Diagnosis not present

## 2024-09-30 DIAGNOSIS — I4729 Other ventricular tachycardia: Secondary | ICD-10-CM | POA: Diagnosis not present

## 2024-09-30 DIAGNOSIS — Z79899 Other long term (current) drug therapy: Secondary | ICD-10-CM | POA: Insufficient documentation

## 2024-09-30 DIAGNOSIS — E785 Hyperlipidemia, unspecified: Secondary | ICD-10-CM | POA: Diagnosis not present

## 2024-09-30 DIAGNOSIS — I340 Nonrheumatic mitral (valve) insufficiency: Secondary | ICD-10-CM | POA: Insufficient documentation

## 2024-09-30 DIAGNOSIS — Z79890 Hormone replacement therapy (postmenopausal): Secondary | ICD-10-CM | POA: Diagnosis not present

## 2024-09-30 DIAGNOSIS — I10 Essential (primary) hypertension: Secondary | ICD-10-CM

## 2024-09-30 DIAGNOSIS — I493 Ventricular premature depolarization: Secondary | ICD-10-CM | POA: Insufficient documentation

## 2024-09-30 DIAGNOSIS — I110 Hypertensive heart disease with heart failure: Secondary | ICD-10-CM | POA: Insufficient documentation

## 2024-09-30 MED ORDER — METOPROLOL SUCCINATE ER 25 MG PO TB24: 12.5000 mg | ORAL_TABLET | Freq: Every day | ORAL | 1 refills | Status: AC

## 2024-09-30 NOTE — Patient Instructions (Addendum)
 Make sure you are taking 1/2 tablet of your metoprolol  every day.   Follow Up: Friday, Jan 28, 2025 at 1:30 PM  It was great seeing you today!

## 2024-10-06 ENCOUNTER — Telehealth: Payer: Self-pay | Admitting: Pharmacist

## 2024-10-06 ENCOUNTER — Other Ambulatory Visit: Payer: Self-pay | Admitting: Pharmacist

## 2024-10-06 DIAGNOSIS — I5022 Chronic systolic (congestive) heart failure: Secondary | ICD-10-CM

## 2024-10-06 DIAGNOSIS — I493 Ventricular premature depolarization: Secondary | ICD-10-CM

## 2024-10-06 NOTE — Progress Notes (Signed)
" ° °  Outreach Note  10/06/2024 Name: Keefer Soulliere MRN: [id number] DOB: [birth year]  Referred by: Lemon Raisin, MD Reason for referral : Medication Adherence   Was unable to reach patient via telephone today and have left HIPAA compliant message asking patient to return my call.    Sharyle Sia, PharmD, [facility] [phone]    "

## 2024-10-06 NOTE — Progress Notes (Signed)
 "  10/06/2024 Name: Scott Meyer MRN: [id number] DOB: 1951-01-08  Chief Complaint  Patient presents with   Medication Adherence    Scott Meyer is a 74 y.o. year old male who presented for a telephone visit.   They were referred to the pharmacist by their PCP for assistance in managing medication adherence as patient is having difficulty remembering medications   Subjective:  Care Team: Primary Care Provider: Lemon Raisin, MD ; Next Scheduled Visit: 11/23/2024 Cardiologist: Donette Ellouise LABOR, FNP; Next Scheduled Visit: 01/28/2025   Medication Access/Adherence  Current Pharmacy:  [id number], [place] - [name] AT [place] [name] [place] [facility]  [address] Phone: [phone] Fax: [phone]  [facility] - [place], [place] - [address] [place] [address] Phone: [phone] Fax: [phone]   Patient reports affordability concerns with their medications: No  Patient reports access/transportation concerns to their pharmacy: No  Patient reports adherence concerns with their medications:  Yes    Reports has limited eye sight  Patient shares preference to avoid taking extra medicine if not needed; admits to sometimes skipping doses of his medications if feeling well  Reports has a system for filling a tray of his medications each day   Heart Failure (EF 25-30%)/PVCs:  Current medications:  ACEi/ARB/ARNI: valsartan  40 mg - 1/2 tablet (20 mg) daily SGLT2i: Farxiga  10 mg daily before breakfast Beta blocker: metoprolol  ER 25 mg - 1/2 tablet (12.5 mg) tablet daily Mineralocorticoid Receptor Antagonist: spironolactone  25 mg daily Diuretic regimen: torsemide  20 mg - 1/2 tablet (10 mg) daily Rhythm control: amiodarone  200 mg daily   Reports has access to a home upper arm blood pressure monitor, but denies monitoring home blood pressure recently  Current home  weights: reports recently stable ~210 lbs  Patient denies volume overload signs or symptoms including shortness of breath, lower extremity edema, increased use of pillows at night   Current physical activity: limited to walking around in his home/standing in kitchen while cooking   Objective:   Lab Results  Component Value Date   CREATININE 1.51 (H) 08/30/2024   BUN 20 08/30/2024   NA 139 08/30/2024   K 4.4 08/30/2024   CL 100 08/30/2024   CO2 21 08/30/2024    Lab Results  Component Value Date   CHOL 129 12/04/2023   HDL 42 12/04/2023   LDLCALC 76 12/04/2023   TRIG 47 12/04/2023   CHOLHDL 3.1 12/04/2023   BP Readings from Last 3 Encounters:  09/30/24 116/64  09/21/24 106/68  08/24/24 110/70    Pulse Readings from Last 3 Encounters:  09/30/24 (!) 46  09/21/24 (!) 48  08/24/24 (!) 47     Medications Reviewed Today     Reviewed by Alana Scott LABOR, RPH-CPP (Pharmacist) on 10/06/24 at 1616  Med List Status: <None>   Medication Order Taking? Sig Documenting Provider Last Dose Status Informant  acetaminophen  (TYLENOL ) 500 MG tablet [id number] Yes Take 500 mg by mouth every 6 (six) hours as needed for mild pain. [provider]  Active            Med Note ANNELL JEANNINE CHRISTELLA [place] Sep 30, 2024  1:23 PM)    amiodarone  (PACERONE ) 200 MG tablet [id number] Yes TAKE 1 TABLET(200 MG) BY MOUTH DAILY Rolan Ezra RAMAN, MD  Active   aspirin  EC 325 MG tablet [id number] Yes Take 325 mg  by mouth every 4 (four) hours as needed for moderate pain (pain score 4-6) (for chest pain). [provider]  Active   atorvastatin  (LIPITOR) 40 MG tablet [id number] Yes TAKE 1 TABLET BY MOUTH ONCE DAILY. Lemon Raisin, MD  Active   FARXIGA  10 MG TABS tablet [id number] Yes Take 1 tablet (10 mg total) by mouth daily before breakfast. Rolan Ezra RAMAN, MD  Active   levothyroxine  (SYNTHROID ) 50 MCG tablet [id number] Yes Take 1 tablet (50 mcg total) by mouth daily before breakfast. Donette Ellouise LABOR, FNP  Active   metoprolol  succinate (TOPROL -XL) 25 MG 24 hr tablet [id number] Yes Take 0.5 tablets (12.5 mg total) by mouth daily. Take with or immediately following a meal. Donette Ellouise A, FNP  Active   spironolactone  (ALDACTONE ) 25 MG tablet [id number] Yes Take 1 tablet (25 mg total) by mouth daily. Donette Ellouise A, FNP  Active   torsemide  (DEMADEX ) 20 MG tablet [id number] Yes Take 0.5 tablets (10 mg total) by mouth daily. Donette Ellouise A, FNP  Active   valsartan  (DIOVAN ) 40 MG tablet [id number] Yes Take 0.5 tablets (20 mg total) by mouth daily. Donette Ellouise LABOR, FNP  Active               Assessment/Plan:   Comprehensive medication review performed; medication list updated in electronic medical record  Discuss importance of medication adherence.  Encourage patient to consider using weekly pillbox as adherence aid. Recommend patient consistently use AVS med list from latest office visit to make sure that he is taking each of his medications as directed. - Will mail patient a weekly pillbox  Patient not an ideal candidate for using adherence packaging due to number of medications requiring split tablet dosing (split tablets cannot be included in packaging)   Heart Failure (EF 25-30%)/PVCs: - Reviewed appropriate blood pressure monitoring technique and reviewed goal blood pressure - Reviewed to weigh daily and when to contact cardiology with weight gain - Patient  - Recommend to monitor home blood pressure, keep a log of the results and have this record to review during future appointments    Follow Up Plan: Clinical Pharmacist will follow up with patient by telephone on 10/20/2024 at 11:30 AM   Scott Meyer, PharmD, [facility] [phone]    "

## 2024-10-08 NOTE — Patient Instructions (Signed)
Check your blood pressure twice weekly, and any time you have concerning symptoms like headache, chest pain, dizziness, shortness of breath, or vision changes.   To appropriately check your blood pressure, make sure you do the following:  1) Avoid caffeine, exercise, or tobacco products for 30 minutes before checking. Empty your bladder. 2) Sit with your back supported in a flat-backed chair. Rest your arm on something flat (arm of the chair, table, etc). 3) Sit still with your feet flat on the floor, resting, for at least 5 minutes.  4) Check your blood pressure. Take 1-2 readings.  5) Write down these readings and bring with you to any provider appointments.  Bring your home blood pressure machine with you to a provider's office for accuracy comparison at least once a year.   Make sure you take your blood pressure medications before you come to any office visit, even if you were asked to fast for labs.  Estelle Grumbles, PharmD, [facility] [phone]

## 2024-10-20 ENCOUNTER — Other Ambulatory Visit: Payer: Self-pay | Admitting: Pharmacist

## 2024-10-20 DIAGNOSIS — I5022 Chronic systolic (congestive) heart failure: Secondary | ICD-10-CM

## 2024-10-20 NOTE — Patient Instructions (Signed)
Check your blood pressure twice weekly, and any time you have concerning symptoms like headache, chest pain, dizziness, shortness of breath, or vision changes.   To appropriately check your blood pressure, make sure you do the following:  1) Avoid caffeine, exercise, or tobacco products for 30 minutes before checking. Empty your bladder. 2) Sit with your back supported in a flat-backed chair. Rest your arm on something flat (arm of the chair, table, etc). 3) Sit still with your feet flat on the floor, resting, for at least 5 minutes.  4) Check your blood pressure. Take 1-2 readings.  5) Write down these readings and bring with you to any provider appointments.  Bring your home blood pressure machine with you to a provider's office for accuracy comparison at least once a year.   Make sure you take your blood pressure medications before you come to any office visit, even if you were asked to fast for labs.  Estelle Grumbles, PharmD, [facility] [phone]

## 2024-10-20 NOTE — Progress Notes (Signed)
 "  10/20/2024 Name: Scott Meyer MRN: [id number] DOB: May 04, 1951  Chief Complaint  Patient presents with   Medication Adherence    Dwyane Dupree is a 74 y.o. year old male who presented for a telephone visit.   They were referred to the pharmacist by their PCP for assistance in managing medication adherence as patient is having difficulty remembering medications     Subjective:   Care Team: Primary Care Provider: Lemon Raisin, MD ; Next Scheduled Visit: 11/23/2024 Cardiologist: Donette Ellouise LABOR, FNP; Next Scheduled Visit: 01/28/2025     Medication Access/Adherence  Current Pharmacy:  [facility] #[id number],  - [name] AT [facility] OF [name] [name] [address] Phone: [phone] Fax: [phone]  [facility] Delivery - [place] - [address] [place]  [address] Phone: [phone] Fax: [phone]   Patient reports affordability concerns with their medications: No  Patient reports access/transportation concerns to their pharmacy: No  Patient reports adherence concerns with their medications:  Yes     Reports has limited eye sight   Patient has shared preference to avoid taking extra medicine if not needed; admits to in the past sometimes skipping doses of his medications if feeling well, but reports now taking medications consistently as directed   Reports has a system for filling a tray of his medications each day. Confirms received weekly pillbox via mail, but has not yet started using it. Plans to start using weekly pillbox tomorrow (when uses up current supply already filled into tray)     Heart Failure (EF 25-30%)/PVCs:   Current medications:  ACEi/ARB/ARNI: valsartan  40 mg - 1/2 tablet (20 mg) daily SGLT2i: Farxiga  10 mg daily before breakfast Beta blocker: metoprolol  ER 25 mg - 1/2 tablet (12.5 mg) tablet daily Mineralocorticoid Receptor Antagonist:  spironolactone  25 mg daily Diuretic regimen: torsemide  20 mg - 1/2 tablet (10 mg) daily Rhythm control: amiodarone  200 mg - 1 tablet daily    Reports has access to a home upper arm blood pressure monitor, but denies monitoring home blood pressure recently   Current home weights: reports recently stable ~210 lbs   Patient denies volume overload signs or symptoms including shortness of breath, lower extremity edema, increased use of pillows at night     Current physical activity: limited to walking around in his home/standing in kitchen while cooking   Objective:   Lab Results  Component Value Date   CREATININE 1.51 (H) 08/30/2024   BUN 20 08/30/2024   NA 139 08/30/2024   K 4.4 08/30/2024   CL 100 08/30/2024   CO2 21 08/30/2024    Lab Results  Component Value Date   CHOL 129 12/04/2023   HDL 42 12/04/2023   LDLCALC 76 12/04/2023   TRIG 47 12/04/2023   CHOLHDL 3.1 12/04/2023   BP Readings from Last 3 Encounters:  09/30/24 116/64  09/21/24 106/68  08/24/24 110/70   Pulse Readings from Last 3 Encounters:  09/30/24 (!) 46  09/21/24 (!) 48  08/24/24 (!) 47     Medications Reviewed Today     Reviewed by Alana Sharyle LABOR, RPH-CPP (Pharmacist) on 10/20/24 at 1151  Med List Status: <None>   Medication Order Taking? Sig Documenting Provider Last Dose Status Informant  acetaminophen  (TYLENOL ) 500 MG tablet [id number]  Take 500 mg by mouth every 6 (six) hours as needed for mild pain. [provider]  Active  Med Note ANNELL JEANNINE CHRISTELLA [place] Sep 30, 2024  1:23 PM)    amiodarone  (PACERONE ) 200 MG tablet [id number] Yes TAKE 1 TABLET(200 MG) BY MOUTH DAILY McLean, Dalton S, MD  Active   aspirin  EC 325 MG tablet [id number] Yes Take 325 mg by mouth every 4 (four) hours as needed for moderate pain (pain score 4-6) (for chest pain). [provider]  Active   atorvastatin  (LIPITOR) 40 MG tablet [id number] Yes TAKE 1 TABLET BY MOUTH ONCE DAILY. Lemon Raisin, MD  Active   FARXIGA  10 MG TABS tablet [id number] Yes Take 1 tablet (10 mg total) by mouth daily before breakfast. Rolan Ezra RAMAN, MD  Active   levothyroxine  (SYNTHROID ) 50 MCG tablet [id number] Yes Take 1 tablet (50 mcg total) by mouth daily before breakfast. Donette Ellouise LABOR, FNP  Active   metoprolol  succinate (TOPROL -XL) 25 MG 24 hr tablet [id number] Yes Take 0.5 tablets (12.5 mg total) by mouth daily. Take with or immediately following a meal. Donette Ellouise A, FNP  Active   spironolactone  (ALDACTONE ) 25 MG tablet [id number] Yes Take 1 tablet (25 mg total) by mouth daily. Donette Ellouise A, FNP  Active   torsemide  (DEMADEX ) 20 MG tablet [id number] Yes Take 0.5 tablets (10 mg total) by mouth daily. Donette Ellouise A, FNP  Active   valsartan  (DIOVAN ) 40 MG tablet [id number] Yes Take 0.5 tablets (20 mg total) by mouth daily. Donette Ellouise LABOR, FNP  Active               Assessment/Plan:   Comprehensive medication review performed; medication list updated in electronic medical record - Find that patient has both bottles levothyroxine  25 mcg and levothyroxine  50 mcg, but reports only taking levothyroxine  50 mcg each morning as directed. Reports will discard bottle of previous strength (levothyroxine  25 mcg) now. Contact [facility] to confirm previous prescription (levothyroxine  25 mcg) has been discontinued.   Review importance of medication adherence. Recommend patient consistently use AVS med list from latest office visit to make sure that he is taking each of his medications as directed. Verbalizes understand to take levothyroxine  each morning on an empty stomach, at least 30 to 60 minutes before food - Encourage patient to take medications ~the same time each day  Note patient not an ideal candidate for using adherence packaging due to number of medications requiring split tablet dosing (split tablets cannot be included in packaging)     Heart Failure (EF 25-30%)/PVCs: - Reviewed  appropriate blood pressure monitoring technique and reviewed goal blood pressure - Reviewed to weigh daily and when to contact cardiology with weight gain - Patient plans to obtain his own upper arm blood pressure monitor (rather than using his sister's) - Recommend to monitor home blood pressure, keep a log of the results and have this record to review during future appointments      Follow Up Plan:  - Offer to follow up with patient sooner, but he prefers to schedule follow up after next appointment with PCP - Clinical Pharmacist will follow up with patient by telephone on 12/22/2024 at 11:30 AM     Sharyle Sia, PharmD, [facility] [phone]   "

## 2024-11-23 ENCOUNTER — Ambulatory Visit: Admitting: Student

## 2024-12-22 ENCOUNTER — Other Ambulatory Visit: Admitting: Pharmacist

## 2025-01-28 ENCOUNTER — Ambulatory Visit: Admitting: Family

## 2025-06-29 ENCOUNTER — Ambulatory Visit
